# Patient Record
Sex: Female | Born: 1935 | Race: White | Hispanic: No | Marital: Married | Smoking: Never smoker
Health system: Southern US, Community
[De-identification: ages and names within clinical notes are randomized; demographics above are authoritative.]

## PROBLEM LIST (undated history)

## (undated) DIAGNOSIS — I340 Nonrheumatic mitral (valve) insufficiency: Secondary | ICD-10-CM

## (undated) DIAGNOSIS — I499 Cardiac arrhythmia, unspecified: Secondary | ICD-10-CM

## (undated) DIAGNOSIS — I6381 Other cerebral infarction due to occlusion or stenosis of small artery: Secondary | ICD-10-CM

## (undated) DIAGNOSIS — C649 Malignant neoplasm of unspecified kidney, except renal pelvis: Secondary | ICD-10-CM

## (undated) DIAGNOSIS — Z923 Personal history of irradiation: Secondary | ICD-10-CM

## (undated) DIAGNOSIS — Z8673 Personal history of transient ischemic attack (TIA), and cerebral infarction without residual deficits: Secondary | ICD-10-CM

## (undated) DIAGNOSIS — E039 Hypothyroidism, unspecified: Secondary | ICD-10-CM

## (undated) DIAGNOSIS — M810 Age-related osteoporosis without current pathological fracture: Secondary | ICD-10-CM

## (undated) DIAGNOSIS — R0602 Shortness of breath: Secondary | ICD-10-CM

## (undated) DIAGNOSIS — E7800 Pure hypercholesterolemia, unspecified: Secondary | ICD-10-CM

## (undated) DIAGNOSIS — C189 Malignant neoplasm of colon, unspecified: Secondary | ICD-10-CM

## (undated) DIAGNOSIS — Z8679 Personal history of other diseases of the circulatory system: Secondary | ICD-10-CM

## (undated) DIAGNOSIS — C50919 Malignant neoplasm of unspecified site of unspecified female breast: Secondary | ICD-10-CM

## (undated) DIAGNOSIS — Z85038 Personal history of other malignant neoplasm of large intestine: Secondary | ICD-10-CM

## (undated) DIAGNOSIS — N1832 Chronic kidney disease, stage 3b: Secondary | ICD-10-CM

## (undated) DIAGNOSIS — Z9221 Personal history of antineoplastic chemotherapy: Secondary | ICD-10-CM

## (undated) DIAGNOSIS — I4891 Unspecified atrial fibrillation: Secondary | ICD-10-CM

## (undated) DIAGNOSIS — I341 Nonrheumatic mitral (valve) prolapse: Secondary | ICD-10-CM

## (undated) HISTORY — PX: ILEOSTOMY: SHX1783

## (undated) HISTORY — PX: BREAST BIOPSY: SHX20

## (undated) HISTORY — DX: Unspecified atrial fibrillation: I48.91

## (undated) HISTORY — DX: Age-related osteoporosis without current pathological fracture: M81.0

## (undated) HISTORY — DX: Malignant neoplasm of unspecified site of unspecified female breast: C50.919

## (undated) HISTORY — DX: Personal history of other malignant neoplasm of large intestine: Z85.038

## (undated) HISTORY — DX: Pure hypercholesterolemia, unspecified: E78.00

## (undated) HISTORY — DX: Other cerebral infarction due to occlusion or stenosis of small artery: I63.81

## (undated) HISTORY — DX: Nonrheumatic mitral (valve) prolapse: I34.1

## (undated) HISTORY — DX: Hypothyroidism, unspecified: E03.9

## (undated) HISTORY — DX: Malignant neoplasm of unspecified kidney, except renal pelvis: C64.9

## (undated) HISTORY — DX: Malignant neoplasm of colon, unspecified: C18.9

## (undated) HISTORY — DX: Shortness of breath: R06.02

## (undated) HISTORY — PX: BREAST LUMPECTOMY: SHX2

---

## 1988-01-26 DIAGNOSIS — C50919 Malignant neoplasm of unspecified site of unspecified female breast: Secondary | ICD-10-CM

## 1988-01-26 HISTORY — DX: Malignant neoplasm of unspecified site of unspecified female breast: C50.919

## 1998-01-25 HISTORY — PX: COLON SURGERY: SHX602

## 1998-02-07 ENCOUNTER — Ambulatory Visit (HOSPITAL_COMMUNITY): Admission: RE | Admit: 1998-02-07 | Discharge: 1998-02-07 | Payer: Self-pay | Admitting: Gastroenterology

## 1998-02-28 ENCOUNTER — Other Ambulatory Visit: Admission: RE | Admit: 1998-02-28 | Discharge: 1998-02-28 | Payer: Self-pay | Admitting: Urology

## 1998-04-18 ENCOUNTER — Encounter: Payer: Self-pay | Admitting: General Surgery

## 1998-04-22 ENCOUNTER — Inpatient Hospital Stay (HOSPITAL_COMMUNITY): Admission: RE | Admit: 1998-04-22 | Discharge: 1998-04-27 | Payer: Self-pay | Admitting: General Surgery

## 1999-02-20 ENCOUNTER — Other Ambulatory Visit: Admission: RE | Admit: 1999-02-20 | Discharge: 1999-02-20 | Payer: Self-pay | Admitting: Obstetrics and Gynecology

## 1999-03-13 ENCOUNTER — Encounter: Admission: RE | Admit: 1999-03-13 | Discharge: 1999-03-13 | Payer: Self-pay | Admitting: Obstetrics and Gynecology

## 1999-03-13 ENCOUNTER — Encounter: Payer: Self-pay | Admitting: Obstetrics and Gynecology

## 1999-10-14 ENCOUNTER — Encounter: Payer: Self-pay | Admitting: Oncology

## 1999-10-14 ENCOUNTER — Encounter: Admission: RE | Admit: 1999-10-14 | Discharge: 1999-10-14 | Payer: Self-pay | Admitting: Oncology

## 2000-02-23 ENCOUNTER — Other Ambulatory Visit: Admission: RE | Admit: 2000-02-23 | Discharge: 2000-02-23 | Payer: Self-pay | Admitting: Obstetrics and Gynecology

## 2000-10-14 ENCOUNTER — Encounter: Payer: Self-pay | Admitting: Obstetrics and Gynecology

## 2000-10-14 ENCOUNTER — Encounter: Admission: RE | Admit: 2000-10-14 | Discharge: 2000-10-14 | Payer: Self-pay | Admitting: Obstetrics and Gynecology

## 2000-10-31 ENCOUNTER — Encounter: Payer: Self-pay | Admitting: Obstetrics and Gynecology

## 2000-10-31 ENCOUNTER — Encounter: Admission: RE | Admit: 2000-10-31 | Discharge: 2000-10-31 | Payer: Self-pay | Admitting: Obstetrics and Gynecology

## 2001-02-28 ENCOUNTER — Other Ambulatory Visit: Admission: RE | Admit: 2001-02-28 | Discharge: 2001-02-28 | Payer: Self-pay | Admitting: Obstetrics and Gynecology

## 2001-10-23 ENCOUNTER — Encounter: Payer: Self-pay | Admitting: Oncology

## 2001-10-23 ENCOUNTER — Encounter: Admission: RE | Admit: 2001-10-23 | Discharge: 2001-10-23 | Payer: Self-pay | Admitting: Oncology

## 2002-01-05 ENCOUNTER — Encounter: Payer: Self-pay | Admitting: Family Medicine

## 2002-01-05 ENCOUNTER — Encounter: Admission: RE | Admit: 2002-01-05 | Discharge: 2002-01-05 | Payer: Self-pay | Admitting: Family Medicine

## 2002-03-13 ENCOUNTER — Encounter: Payer: Self-pay | Admitting: Internal Medicine

## 2002-04-05 ENCOUNTER — Other Ambulatory Visit: Admission: RE | Admit: 2002-04-05 | Discharge: 2002-04-05 | Payer: Self-pay | Admitting: Obstetrics and Gynecology

## 2002-04-16 ENCOUNTER — Ambulatory Visit: Admission: RE | Admit: 2002-04-16 | Discharge: 2002-04-16 | Payer: Self-pay | Admitting: *Deleted

## 2002-11-09 ENCOUNTER — Encounter: Payer: Self-pay | Admitting: Obstetrics and Gynecology

## 2002-11-09 ENCOUNTER — Encounter: Admission: RE | Admit: 2002-11-09 | Discharge: 2002-11-09 | Payer: Self-pay | Admitting: Obstetrics and Gynecology

## 2003-03-23 ENCOUNTER — Emergency Department (HOSPITAL_COMMUNITY): Admission: AD | Admit: 2003-03-23 | Discharge: 2003-03-24 | Payer: Self-pay | Admitting: Emergency Medicine

## 2003-04-09 ENCOUNTER — Other Ambulatory Visit: Admission: RE | Admit: 2003-04-09 | Discharge: 2003-04-09 | Payer: Self-pay | Admitting: Obstetrics and Gynecology

## 2003-10-17 ENCOUNTER — Emergency Department (HOSPITAL_COMMUNITY): Admission: EM | Admit: 2003-10-17 | Discharge: 2003-10-17 | Payer: Self-pay | Admitting: Emergency Medicine

## 2003-12-27 ENCOUNTER — Encounter: Admission: RE | Admit: 2003-12-27 | Discharge: 2003-12-27 | Payer: Self-pay | Admitting: Obstetrics and Gynecology

## 2004-01-17 ENCOUNTER — Encounter: Admission: RE | Admit: 2004-01-17 | Discharge: 2004-01-17 | Payer: Self-pay | Admitting: General Surgery

## 2004-04-13 ENCOUNTER — Other Ambulatory Visit: Admission: RE | Admit: 2004-04-13 | Discharge: 2004-04-13 | Payer: Self-pay | Admitting: Obstetrics and Gynecology

## 2004-11-03 ENCOUNTER — Encounter: Payer: Self-pay | Admitting: Internal Medicine

## 2005-01-15 ENCOUNTER — Encounter: Admission: RE | Admit: 2005-01-15 | Discharge: 2005-01-15 | Payer: Self-pay | Admitting: Obstetrics and Gynecology

## 2005-04-21 ENCOUNTER — Other Ambulatory Visit: Admission: RE | Admit: 2005-04-21 | Discharge: 2005-04-21 | Payer: Self-pay | Admitting: Obstetrics and Gynecology

## 2006-01-27 ENCOUNTER — Encounter: Admission: RE | Admit: 2006-01-27 | Discharge: 2006-01-27 | Payer: Self-pay | Admitting: Obstetrics and Gynecology

## 2007-03-02 ENCOUNTER — Encounter: Admission: RE | Admit: 2007-03-02 | Discharge: 2007-03-02 | Payer: Self-pay | Admitting: Obstetrics and Gynecology

## 2008-04-01 ENCOUNTER — Encounter: Admission: RE | Admit: 2008-04-01 | Discharge: 2008-04-01 | Payer: Self-pay | Admitting: Obstetrics and Gynecology

## 2008-12-16 ENCOUNTER — Encounter: Payer: Self-pay | Admitting: Internal Medicine

## 2008-12-17 ENCOUNTER — Encounter: Admission: RE | Admit: 2008-12-17 | Discharge: 2008-12-17 | Payer: Self-pay | Admitting: Family Medicine

## 2008-12-23 DIAGNOSIS — E039 Hypothyroidism, unspecified: Secondary | ICD-10-CM | POA: Insufficient documentation

## 2008-12-23 DIAGNOSIS — E782 Mixed hyperlipidemia: Secondary | ICD-10-CM | POA: Insufficient documentation

## 2008-12-23 DIAGNOSIS — E785 Hyperlipidemia, unspecified: Secondary | ICD-10-CM

## 2008-12-24 ENCOUNTER — Ambulatory Visit: Payer: Self-pay | Admitting: Internal Medicine

## 2008-12-24 DIAGNOSIS — R222 Localized swelling, mass and lump, trunk: Secondary | ICD-10-CM | POA: Insufficient documentation

## 2008-12-24 DIAGNOSIS — J9819 Other pulmonary collapse: Secondary | ICD-10-CM | POA: Insufficient documentation

## 2008-12-25 LAB — CONVERTED CEMR LAB
Basophils Relative: 1.2 % (ref 0.0–3.0)
Eosinophils Absolute: 0.1 10*3/uL (ref 0.0–0.7)
HCT: 38.6 % (ref 36.0–46.0)
Hemoglobin: 13 g/dL (ref 12.0–15.0)
INR: 1 (ref 0.8–1.0)
Lymphs Abs: 1.5 10*3/uL (ref 0.7–4.0)
MCHC: 33.7 g/dL (ref 30.0–36.0)
MCV: 95.1 fL (ref 78.0–100.0)
Monocytes Absolute: 0.4 10*3/uL (ref 0.1–1.0)
Monocytes Relative: 7.2 % (ref 3.0–12.0)
Neutro Abs: 3.6 10*3/uL (ref 1.4–7.7)
Platelets: 237 10*3/uL (ref 150.0–400.0)
Prothrombin Time: 10.6 s (ref 9.1–11.7)
RBC: 4.06 M/uL (ref 3.87–5.11)
RDW: 12.6 % (ref 11.5–14.6)
WBC: 5.7 10*3/uL (ref 4.5–10.5)
aPTT: 30.9 s — ABNORMAL HIGH (ref 21.7–28.8)

## 2008-12-27 ENCOUNTER — Ambulatory Visit: Admission: RE | Admit: 2008-12-27 | Discharge: 2008-12-27 | Payer: Self-pay | Admitting: Internal Medicine

## 2008-12-27 ENCOUNTER — Ambulatory Visit: Payer: Self-pay | Admitting: Internal Medicine

## 2009-01-02 ENCOUNTER — Telehealth: Payer: Self-pay | Admitting: Internal Medicine

## 2009-01-27 ENCOUNTER — Telehealth: Payer: Self-pay | Admitting: Internal Medicine

## 2009-02-05 ENCOUNTER — Telehealth: Payer: Self-pay | Admitting: Pulmonary Disease

## 2009-02-21 ENCOUNTER — Ambulatory Visit: Payer: Self-pay | Admitting: Internal Medicine

## 2009-02-21 DIAGNOSIS — R059 Cough, unspecified: Secondary | ICD-10-CM | POA: Insufficient documentation

## 2009-02-21 DIAGNOSIS — R05 Cough: Secondary | ICD-10-CM | POA: Insufficient documentation

## 2009-02-24 ENCOUNTER — Ambulatory Visit: Payer: Self-pay | Admitting: Internal Medicine

## 2009-02-27 ENCOUNTER — Ambulatory Visit: Payer: Self-pay | Admitting: Cardiology

## 2009-03-03 ENCOUNTER — Encounter: Payer: Self-pay | Admitting: Internal Medicine

## 2009-03-03 ENCOUNTER — Ambulatory Visit: Payer: Self-pay | Admitting: Internal Medicine

## 2009-03-05 ENCOUNTER — Encounter: Payer: Self-pay | Admitting: Internal Medicine

## 2009-03-13 ENCOUNTER — Encounter: Payer: Self-pay | Admitting: Internal Medicine

## 2009-03-26 ENCOUNTER — Telehealth: Payer: Self-pay | Admitting: Internal Medicine

## 2009-04-09 ENCOUNTER — Ambulatory Visit: Payer: Self-pay | Admitting: Internal Medicine

## 2009-04-10 ENCOUNTER — Telehealth: Payer: Self-pay | Admitting: Internal Medicine

## 2009-04-18 ENCOUNTER — Telehealth: Payer: Self-pay | Admitting: Internal Medicine

## 2009-06-17 ENCOUNTER — Ambulatory Visit: Payer: Self-pay | Admitting: Internal Medicine

## 2009-08-15 ENCOUNTER — Encounter: Admission: RE | Admit: 2009-08-15 | Discharge: 2009-08-15 | Payer: Self-pay | Admitting: Obstetrics and Gynecology

## 2010-02-26 NOTE — Miscellaneous (Signed)
Summary: spacer rx  Clinical Lists Changes  Medications: Added new medication of E-Z SPACER  DEVI (SPACER/AERO-HOLDING CHAMBERS) as directed with inhaler

## 2010-02-26 NOTE — Assessment & Plan Note (Signed)
Summary: follow-up   Visit Type:  Follow-up Copy to:  Dr. Dennard Schaumann Primary Provider/Referring Provider:  Dr. Margaretmary Eddy, [place]  CC:  Pt here to discuss CT results and PFT results..  History of Present Illness: IOV 12/24/2008. 75 year old female, passive smoker for 1st 23 years of life, with prior Breast cancer 1990 and Colon Cancer 2000 both in complete remission per hx. S/p Colectomy/ilesotomy. Saw Dr. Dennard Schaumann PMD on 12/16/2008 for 'thyroid check'. She asked for CXR because it had been 10 years since prior cancer and neigbors were being diagnosed with lung cancer. This CXR 12/16/2008 showed RML opacity. This was followed by CT chest 12/17/2008 that showed RML collapse/infilrate described as 'junky' associated with COPD. However, she thinks these findings are acute because she had acute smoke exposure while brewing [place] stew 10 days earlier. Few days after that, developed dry cough with occassional yellow scanty sputum.  Needing cough drops for relief. Cough made worse by talking a lot, desserts, tea etc., but no other precipitating factors. Cough is rated as mild and is improved significantly (> 50%) since onset. Took Zpak that completed 2 days ago. Denies associated fever, sinus drainge, cold, body aches, chest pains, wheezing, dyspnea, edema, URI symptoms  Of note, she has old CXR 03/13/2002 and 11/03/2004 - I revieed those films., the RML infilrate was not seen then  REC: Bronch BAL   12/27/2008: Bronch BAL - necrotic cells and debris. No malignant cells. 720 cells, 51% PMN. Micro negative. Plan to repeat CT chest  8 weeksOV 02/21/2009: Followup for cough and RML atlectasis/consolidation. She is due to have CT on 02/27/2009 but cough has not improived afte rthei intial 50-75% improveent she had before the bronch. She feels cough is bothering her family but she herself feels that she can manage with it. She is puzzled about etiology. SHe is concerned about persistence.  Mostly dry but feels thick sputum stuck in throat and unable to bring it out. Able to bring out occ. scanty yellow sputum early in morning or after drinking somehting cool. She has tried mucinex and tessalon perles but this has not helped. Cough worse day compared to night. No new asscociated fever, weight loss, edema, hemoptysis, dyspnea, wheeze. Does have associated left msk chest pain related to cough. REC: GET CT CHEST and DO FULL PFTs  OV 03/03/2009: Followup for cough and RML atlectasis/consolidation. Here to review PFT and CT Chest. CT chest on 02/27/2009 shows complete resolution of the RML consolidation. There is some scar remaining. PFTs 02/24/2009 show small airway diesease on spirometry, Airtrappling/Hyperinflation on Lung Volumes, with normal DLCO (Fev1 1.9/84%, FVC 2.7L84%, Ratio 71 (71), Small airway 1.1L/47%, TLC 6.5L/118%, RV 3.7L/170%, DLCO 17/81%). Her husband is with her today. Both again very concerned and anxious about cough. Again describes cough as 'wet' cough. Again relates onset to the singular event of brewing stew. Used some old wood that had worms. Feels sputum in throat all the time but able to bring it out only occassonally. Cough present day and night. Worsened when lying down, and talking. Improved by eating something sweet or cool. Asscoiated feeling of ticklish throat present. Denies fever, hemoptysis, edema. Anxious to get better.   Current Medications (verified): 1)  Levothroid 100 Mcg Tabs (Levothyroxine Sodium) .... Take 1 Tablet By Mouth Once A Day 2)  Metoprolol Succinate 25 Mg Xr24h-Tab (Metoprolol Succinate) .... Take 1 Tablet By Mouth Two Times A Day 3)  Calcium-Vitamin D 250-125 Mg-Unit Tabs (Calcium Carbonate-Vitamin D) .Marland KitchenMarland KitchenMarland Kitchen  Take 1 Tablet By Mouth Two Times A Day  Allergies (verified): 1)  ! Crestor (Rosuvastatin Calcium)  Past History:  Past Medical History: Last updated: 12-28-08 Breast cancer 1990, pt had chemo and radiation Ulcerative Colitis    Hypothyroidism Hyperlipidemia Colon Cancer 2000. In complete remisson  Labs 12/16/08: Glucose-133, Creatinine-0.97, Calcium-10.0  Past Surgical History: Last updated: 12/28/2008 Ileostomy-2000 Lumpectomy-1990  Family History: Last updated: Dec 28, 2008 mother-died age 21 COPD, bronchiectasis Father-died 80's Lung Cancer, was a smoker Sister-breast cancer, MAC  Social History: Last updated: 12-28-2008 Married Retired Patient never smoked.  Denies ETOH. Faither smoked. Passively smoked first 23 years of life because dad smoked heavily inside home  Risk Factors: Smoking Status: never (12/28/2008)  Family History: Reviewed history from December 28, 2008 and no changes required. mother-died age 57 COPD, bronchiectasis Father-died 97's Lung Cancer, was a smoker Sister-breast cancer, MAC  Social History: Reviewed history from 12/28/08 and no changes required. Married Retired Patient never smoked.  Denies ETOH. Faither smoked. Passively smoked first 23 years of life because dad smoked heavily inside home  Review of Systems      See HPI       The patient complains of prolonged cough.  The patient denies anorexia, fever, weight loss, weight gain, vision loss, decreased hearing, hoarseness, chest pain, syncope, dyspnea on exertion, peripheral edema, headaches, hemoptysis, abdominal pain, melena, hematochezia, severe indigestion/heartburn, hematuria, incontinence, genital sores, muscle weakness, suspicious skin lesions, transient blindness, difficulty walking, depression, unusual weight change, abnormal bleeding, enlarged lymph nodes, angioedema, breast masses, and testicular masses.    Vital Signs:  Patient profile:   75 year old female Height:      67 inches Weight:      168 pounds O2 Sat:      96 % on Room air Temp:     97.6 degrees F oral Pulse rate:   60 / minute BP sitting:   96 / 68  (right arm) Cuff size:   regular  Vitals Entered By: [place] Bing CMA (March 03, 2009 9:16 AM)  O2 Flow:  Room air CC: Pt here to discuss CT results and PFT results. Comments Medications reviewed with patient [place] Bing CMA  March 03, 2009 9:18 AM Daytime phone number verified with patient.    Physical Exam  General:  well developed, well nourished, in no acute distress. Occ. cough in office Head:  normocephalic and atraumatic Eyes:  PERRLA/EOM intact; conjunctiva and sclera clear Ears:  TMs intact and clear with normal canals Nose:  no deformity, discharge, inflammation, or lesions Mouth:  no deformity or lesions Neck:  no masses, thyromegaly, or abnormal cervical nodes Chest Wall:  no deformities noted Lungs:  clear bilaterally to auscultation and percussion Heart:  regular rate and rhythm, S1, S2 without murmurs, rubs, gallops, or clicks Abdomen:  bowel sounds positive; abdomen soft and non-tender without masses, or organomegaly Msk:  no deformity or scoliosis noted with normal posture Pulses:  pulses normal Extremities:  no clubbing, cyanosis, edema, or deformity noted Neurologic:  CN II-XII grossly intact with normal reflexes, coordination, muscle strength and tone Skin:  intact without lesions or rashes Cervical Nodes:  no significant adenopathy Axillary Nodes:  no significant adenopathy Psych:  alert and cooperative; normal mood and affect; normal attention span and concentration   MISC. Report  Procedure date:  02/27/2009  Findings:       CT chest on 02/27/2009 shows complete resolution of the RML consolidation. There is some scar remaining. PFTs 02/24/2009 show small  airway diesease on spirometry, Airtrappling/Hyperinflation on Lung Volumes, with normal DLCO (Fev1 1.9/84%, FVC 2.7L84%, Ratio 71 (71), Small airway 1.1L/47%, TLC 6.5L/118%, RV 3.7L/170%, DLCO 17/81%)  Comments:      personally reviewed both  Impression & Recommendations:  Problem # 1:  COUGH (ICD-786.2) Assessment Unchanged PFTs are suggesting air trapping and  hyperinflation. The RML infiltrate has resolved. Yet, she is still left iwht cough. Onset was following wood smoke exposure. Possibiliies include a chronic bronchitis like phentotpye, post smoke exposure hypersenstivie airways (even though BD response is negative), associated silent sinus drainage. She denies GERD. I am reassure that the infiltraes have resolved  plan nasal steroids for possible sinus drainage inahled steroid for airtrapping and high residual volume on pft rov 4 weeks if not better, address GERD/other etiologies including habit cough advised patience and perserverance during process of cough managment Orders: HFA Instruction [id number]) Est. Patient Level III [id number])  Problem # 2:  PULMONARY COLLAPSE (ICD-518.0) Assessment: Improved resolved on followup CT Orders: Est. Patient Level III [id number])  Medications Added to Medication List This Visit: 1)  Dulera 100-5 Mcg/act Aero (Mometasone furo-formoterol fum) .... 2 puff two times a day 2)  Nasacort Aq 55 Mcg/act Aers (Triamcinolone acetonide(nasal)) .... Two puffs each nostril daily  Patient Instructions: 1)  Your cough is likely related to a chronic bronchitis like picture or post wood smoke exposure twitchy airways and/or silent sinus drainage 2)  Please take 2 samples each of nasal steroid and low dose dulera 3)  Learn how to use these inhalers 4)  Return to see me in 4 weeks or sooner if worse Prescriptions: NASACORT AQ 55 MCG/ACT  AERS (TRIAMCINOLONE ACETONIDE(NASAL)) Two puffs each nostril daily  #1 x 6   Entered and Authorized by:   Brand Males MD   Signed by:   Brand Males MD on 03/03/2009   Method used:   Electronically to        [facility] [phone]* (retail)       [address]       [place], [place]  [address]       Ph: [phone]       Fax: [phone]   RxIDTI:[id number] DULERA 100-5 MCG/ACT AERO (MOMETASONE FURO-FORMOTEROL FUM) 2 puff two times a day  #1 x 6   Entered and  Authorized by:   Brand Males MD   Signed by:   Brand Males MD on 03/03/2009   Method used:   Electronically to        [facility] [phone]* (retail)       [address]       [place],   [address]       Ph: [phone]       Fax: [phone]   RxIDTT:[id number]

## 2010-02-26 NOTE — Progress Notes (Signed)
Summary: talk to nurse for ct and meds  Phone Note Call from Patient Call back at Home Phone [phone]   Caller: Patient Call For: Agam Tuohy Summary of Call: would like the nurse to call her. she thought that she needed  to schedule a ct scan, she was told to call back at the first of the year to schedule that. Also she has not been feeling any better, she still has bronchitis and coughing up green stuff. She wants dr Geoffery Spruce to call something in for her.  Initial call taken by: Adin Hector,  January 27, 2009 12:32 PM  Follow-up for Phone Call        Pt c/o productive cough with green thick mucus. Pt scheduled to see TP Wed @ [place] CMA  January 27, 2009 2:37 PM

## 2010-02-26 NOTE — Progress Notes (Signed)
Summary: rx not at pharmacy yet  Phone Note Call from Patient Call back at Home Phone [phone]   Caller: Patient Call For: Tessa Seaberry Summary of Call: pt needs the rx for amoxicillin called in to [facility] [address]. pt was seen yesterday but wasn't given a rx for this.  Initial call taken by: Cooper Render, CNA,  April 10, 2009 8:54 AM  Follow-up for Phone Call        rx sent. pt aware.[place] Bing CMA  April 10, 2009 9:05 AM     New/Updated Medications: AMOXICILLIN 500 MG TABS (AMOXICILLIN) Take 1 tablet by mouth three times a day x 5 days Prescriptions: AMOXICILLIN 500 MG TABS (AMOXICILLIN) Take 1 tablet by mouth three times a day x 5 days  #15 x 0   Entered by:   [place] Bing CMA   Authorized by:   Brand Males MD   Signed by:   [place] Bing CMA on 04/10/2009   Method used:   Electronically to        [facility] [phone]* (retail)       [address]       [place], [place]  [address]       Ph: [phone]       Fax: [phone]   RxID:   [id number]

## 2010-02-26 NOTE — Assessment & Plan Note (Signed)
Summary: rov 7-8 wks ///kp   Visit Type:  Follow-up Copy to:  Dr. Dennard Schaumann Primary Provider/Referring Provider:  Dr. Margaretmary Eddy, [place]  CC:  Jocelyn Moss here for follow-up. Jocelyn Moss states cough has improved but is still present. .  History of Present Illness: OV 06/17/2009: Followup for chronic cough. Last visit was on 03/31/2009. Even though by Feb 2011 the  RML infilrte that developed  following her acute smolke exposure in NOv 2010 had resolved, she was still left with persistent cough. Therefore 2 visits ago in Feb 2011 we decided to trial [facility] and nasonex. I followed up again on March 31, 2009. She reported no improvement. My plan was to trial PPIs. Instead she adamantly wanted to trial a course of antibiotic AMOX. She called a few week later stating that cough had further imprved "99.9%". However, at followup today she is contradicting her own history. She states that the amox "was not worth a piece of crap". It did not help. Most recently on 06/08/2009 she completed a 10 day course of avelox for acute sinusitis. THis has not helped cough either. Essentially the cough persists at its residual moderate level. She is compliant with dulera but does not think it is helping. PMD has given her dexilant PPI. She has been on it 15 days and does not think itis helping either. Nasonex was not helping and she dc'ed it 1 month ago. Only relieving factor is honey.   She still feels ticklishness in throat. Feels that the throat is the source of cough. Brings up scanty yellpw sputum a few times daily. Vehmently denies post nasal drip or GERD.Overall she states that her husband is more bothered by her cough than her.   Current Medications (verified): 1)  Levothroid 100 Mcg Tabs (Levothyroxine Sodium) .... Take 1 Tablet By Mouth Once A Day 2)  Metoprolol Succinate 25 Mg Xr24h-Tab (Metoprolol Succinate) .... Take 1 Tablet By Mouth Two Times A Day 3)  Calcium-Vitamin D 250-125 Mg-Unit Tabs (Calcium  Carbonate-Vitamin D) .... Take 1 Tablet By Mouth Two Times A Day 4)  Fluticasone Propionate 50 Mcg/act Susp (Fluticasone Propionate) .... 2 Sprays Each Nostril Two Times A Day 5)  E-Z Spacer  Devi (Spacer/aero-Holding Chambers) .... As Directed With Inhaler 6)  Dulera 100-5 Mcg/act Aero (Mometasone Furo-Formoterol Fum) .... 2 Puffs Twice Daily 7)  Dexilant 60 Mg Cpdr (Dexlansoprazole) .... Take 1 Tablet By Mouth Once A Day  Allergies (verified): 1)  ! Crestor (Rosuvastatin Calcium)  Past History:  Family History: Last updated: Jan 06, 2009 mother-died age 85 COPD, bronchiectasis Father-died 12's Lung Cancer, was a smoker Sister-breast cancer, MAC  Social History: Last updated: 01/06/09 Married Retired Patient never smoked.  Denies ETOH. Faither smoked. Passively smoked first 23 years of life because dad smoked heavily inside home  Risk Factors: Smoking Status: never (Jan 06, 2009)  Past Medical History: Reviewed history from 01-06-2009 and no changes required. Breast cancer 1990, Jocelyn Moss had chemo and radiation Ulcerative Colitis  Hypothyroidism Hyperlipidemia Colon Cancer 2000. In complete remisson  Labs 12/16/08: Glucose-133, Creatinine-0.97, Calcium-10.0  Past Surgical History: Reviewed history from 01/06/09 and no changes required. Ileostomy-2000 Lumpectomy-1990  Past Pulmonary History:  Pulmonary History: CHRONIC COUGH   > passive smoker 1st 23 years of life > Cough followed acute smoke exposure while brewing [place] Stew 12/07/2008 > CT 12/17/2008 - RML collapse/infiltrate > Bronch BAL 12/27/2008: necrotic cell debris, 51%PMC. No malignance cless.Micro neg > PFTs 02/24/2009: - small airway disease with normal DLCO. Fev1 1.9/84%, Ratio  71(71),    DLCO 17/81%, TLC 6.5L/118% > Followup CT 02/27/2009: complete resolution of RML consolidaiton. Residual scar + > OV 03/03/2009: Residual cough +. Start empiric dulera and nasonex > Ov 04/02/2009:No improvement with dulera  and nasonex. EMpiric Amox Trial at her request  Family History: Reviewed history from 12/24/2008 and no changes required. mother-died age 48 COPD, bronchiectasis Father-died 65's Lung Cancer, was a smoker Sister-breast cancer, MAC  Social History: Reviewed history from 12/24/2008 and no changes required. Married Retired Patient never smoked.  Denies ETOH. Faither smoked. Passively smoked first 23 years of life because dad smoked heavily inside home  Review of Systems       The patient complains of productive cough and non-productive cough.  The patient denies shortness of breath with activity, shortness of breath at rest, coughing up blood, chest pain, irregular heartbeats, acid heartburn, indigestion, loss of appetite, weight change, abdominal pain, difficulty swallowing, sore throat, tooth/dental problems, headaches, nasal congestion/difficulty breathing through nose, sneezing, itching, ear ache, anxiety, depression, hand/feet swelling, joint stiffness or pain, rash, change in color of mucus, and fever.    Vital Signs:  Patient profile:   75 year old female Height:      67 inches Weight:      168 pounds O2 Sat:      98 % on Room air Temp:     98.7 degrees F oral Pulse rate:   67 / minute BP sitting:   124 / 84  (right arm) Cuff size:   regular  Vitals Entered By: [place] Bing CMA (Jun 17, 2009 2:54 PM)  O2 Flow:  Room air CC: Jocelyn Moss here for follow-up. Jocelyn Moss states cough has improved but is still present.  Comments Medications reviewed with patient [place] Bing CMA  Jun 17, 2009 2:54 PM Daytime phone number verified with patient.    Physical Exam  General:  well developed, well nourished, in no acute distress. Coughed once - mild laryngeal quality + Head:  normocephalic and atraumatic Eyes:  PERRLA/EOM intact; conjunctiva and sclera clear Ears:  TMs intact and clear with normal canals Nose:  no deformity, discharge, inflammation, or lesions Mouth:  no deformity or  lesions Neck:  no masses, thyromegaly, or abnormal cervical nodes Chest Wall:  no deformities noted Lungs:  clear bilaterally to auscultation and percussion Heart:  regular rate and rhythm, S1, S2 without murmurs, rubs, gallops, or clicks Abdomen:  bowel sounds positive; abdomen soft and non-tender without masses, or organomegaly Msk:  no deformity or scoliosis noted with normal posture Pulses:  pulses normal Extremities:  no clubbing, cyanosis, edema, or deformity noted Neurologic:  CN II-XII grossly intact with normal reflexes, coordination, muscle strength and tone Skin:  intact without lesions or rashes Cervical Nodes:  no significant adenopathy Axillary Nodes:  no significant adenopathy Psych:  alert and cooperative; normal mood and affect; normal attention span and concentration   Impression & Recommendations:  Problem # 1:  COUGH (ICD-786.2) Assessment Unchanged  no improvement in cough despite resolution of RML infiltrate and despite empiric dulera,nasonex, PPI Rx. COugh has laryngeal quality to it except for the fact she brings out trace amountts of yellow sputum daily. Denies post nasal drainge  plan recommendd to continue dulera and PPI for time being start netti pot in case there is silent sinus drainge for which nasonex was not helping start neurontin for habit cough - I explained the concept of habitcough - she is reluctant to buy this hypothesis fu in 1 month if no  improvement, will get 2nd opinion from Dr. Melvyn Novas  Orders: Est. Patient Level III [phone])  Medications Added to Medication List This Visit: 1)  Dexilant 60 Mg Cpdr (Dexlansoprazole) .... Take 1 tablet by mouth once a day 2)  Neurontin 300 Mg Caps (Gabapentin) .... 3 tablets three times daily. day 1 - one tablet onely, day 2 - two tablets two times a day, and day 3 on - three times a day  Patient Instructions: 1)  take netti pot sample with you and use it daily 2)  start neurontin for cough AS  DIRECTED 3)  if you are too sleep with neurontin, call us 4)  take the side effect sheet for neurontin 5)  continue dulera and dexliant 6)  take dexilant samples for 1 month 7)  return in 1 month for followup 8)  hope you get better 9)  if not better, will refer you to Dr. Melvyn Novas Prescriptions: NEURONTIN 300 MG CAPS (GABAPENTIN) 3 TABLETS THREE TIMES DAILY. DAY 1 - ONE TABLET ONELY, dAY 2 - TWO TABLETS two times a day, AND DAY 3 ON - three times a day  #90 x 1   Entered and Authorized by:   Brand Males MD   Signed by:   Brand Males MD on 06/17/2009   Method used:   Electronically to        [facility] [phone]* (retail)       [address]       [place], [place]  [address]       Ph: [phone]       Fax: [phone]   RxID:   [id number]

## 2010-02-26 NOTE — Progress Notes (Signed)
Summary: dulera sample  Phone Note Call from Patient Call back at Home Phone [phone]   Caller: Patient Call For: Katelynd Blauvelt Reason for Call: Talk to Nurse Summary of Call: pt running low on sample [place].  Can she get another sample? Initial call taken by: Zigmund Gottron,  March 26, 2009 9:14 AM  Follow-up for Phone Call        sample left at front. pt aware.[place] Bing CMA  March 26, 2009 10:10 AM

## 2010-02-26 NOTE — Assessment & Plan Note (Signed)
Summary: 4 weeks/apc   Visit Type:  Follow-up Copy to:  Dr. Dennard Schaumann Primary Provider/Referring Provider:  Dr. Margaretmary Eddy, [facility]  CC:  Pt here for 4 week follow-up.  Nasal drainage and cough has improved and but still has some drainage and cough.  .  History of Present Illness: IOV 12/24/2008. 75 year old female, passive smoker for 1st 23 years of life, with prior Breast cancer 1990 and Colon Cancer 2000 both in complete remission per hx. S/p Colectomy/ilesotomy. Saw Dr. Dennard Schaumann PMD on 12/16/2008 for 'thyroid check'. She asked for CXR because it had been 10 years since prior cancer and neigbors were being diagnosed with lung cancer. This CXR 12/16/2008 showed RML opacity. This was followed by CT chest 12/17/2008 that showed RML collapse/infilrate described as 'junky' associated with COPD. However, she thinks these findings are acute because she had acute smoke exposure while brewing [place] stew 10 days earlier. Few days after that, developed dry cough with occassional yellow scanty sputum.  Needing cough drops for relief. Cough made worse by talking a lot, desserts, tea etc., but no other precipitating factors. Cough is rated as mild and is improved significantly (> 50%) since onset. Took Zpak that completed 2 days ago. Denies associated fever, sinus drainge, cold, body aches, chest pains, wheezing, dyspnea, edema, URI symptoms  Of note, she has old CXR 03/13/2002 and 11/03/2004 - I revieed those films., the RML infilrate was not seen then  REC: Bronch BAL   12/27/2008: Bronch BAL - necrotic cells and debris. No malignant cells. 720 cells, 51% PMN. Micro negative. Plan to repeat CT chest  8 weeksOV 02/21/2009: Followup for cough and RML atlectasis/consolidation. She is due to have CT on 02/27/2009 but cough has not improived afte rthei intial 50-75% improveent she had before the bronch. She feels cough is bothering her family but she herself feels that she can manage with it.  She is puzzled about etiology. SHe is concerned about persistence. Mostly dry but feels thick sputum stuck in throat and unable to bring it out. Able to bring out occ. scanty yellow sputum early in morning or after drinking somehting cool. She has tried mucinex and tessalon perles but this has not helped. Cough worse day compared to night. No new asscociated fever, weight loss, edema, hemoptysis, dyspnea, wheeze. Does have associated left msk chest pain related to cough. REC: GET CT CHEST and DO FULL PFTs  OV 03/03/2009: Followup for cough and RML atlectasis/consolidation. Here to review PFT and CT Chest. CT chest on 02/27/2009 shows complete resolution of the RML consolidation. There is some scar remaining. PFTs 02/24/2009 show small airway diesease on spirometry, Airtrappling/Hyperinflation on Lung Volumes, with normal DLCO (Fev1 1.9/84%, FVC 2.7L84%, Ratio 71 (71), Small airway 1.1L/47%, TLC 6.5L/118%, RV 3.7L/170%, DLCO 17/81%). Her husband is with her today. Both again very concerned and anxious about cough. Again describes cough as 'wet' cough. Again relates onset to the singular event of brewing stew. Used some old wood that had worms. Feels sputum in throat all the time but able to bring it out only occassonally. Cough present day and night. Worsened when lying down, and talking. Improved by eating something sweet or cool. Asscoiated feeling of ticklish throat present. Denies fever, hemoptysis, edema. Anxious to get better.   REC: Empiric Dulera and nasonexOV 04/02/2009: Followup Chronic Cough. Since starting duleran and nasal steroid cough is 'somewhat' better. I pusehd her to quantify and best she could state was that it is better  in the mild-moderate range. Less sputum. Less throat irritation. Still has episodies esp soon after lying down. Has scanty yellow mucus that persists. Denies post nasal drainage, fever, GERD, edema. ticklish sensation in throat and having to clear her throat frequently. She is  asking for AMOX antibiotic to get rid of cough. I reminded her of the differeing cough etiologies and step wise approach to tacking chronic cough. She denies this could be related to sinus drainage or GERD. She defintiely wants to give amox a try. Unwilling to adopt other approaches. Unwilling for referral to ENT to have her sinuses examined.   Allergies: 1)  ! Crestor (Rosuvastatin Calcium)  Past History:  Family History: Last updated: 2008-12-27 mother-died age 81 COPD, bronchiectasis Father-died 48's Lung Cancer, was a smoker Sister-breast cancer, MAC  Social History: Last updated: December 27, 2008 Married Retired Patient never smoked.  Denies ETOH. Faither smoked. Passively smoked first 23 years of life because dad smoked heavily inside home  Risk Factors: Smoking Status: never (Dec 27, 2008)  Past Medical History: Reviewed history from 12-27-2008 and no changes required. Breast cancer 1990, pt had chemo and radiation Ulcerative Colitis  Hypothyroidism Hyperlipidemia Colon Cancer 2000. In complete remisson  Labs 12/16/08: Glucose-133, Creatinine-0.97, Calcium-10.0  Past Surgical History: Reviewed history from 12/27/08 and no changes required. Ileostomy-2000 Lumpectomy-1990  Family History: Reviewed history from 27-Dec-2008 and no changes required. mother-died age 42 COPD, bronchiectasis Father-died 59's Lung Cancer, was a smoker Sister-breast cancer, MAC  Social History: Reviewed history from 27-Dec-2008 and no changes required. Married Retired Patient never smoked.  Denies ETOH. Faither smoked. Passively smoked first 23 years of life because dad smoked heavily inside home  Review of Systems       The patient complains of non-productive cough.  The patient denies shortness of breath with activity, shortness of breath at rest, productive cough, coughing up blood, chest pain, irregular heartbeats, acid heartburn, indigestion, loss of appetite, weight change, abdominal  pain, difficulty swallowing, sore throat, tooth/dental problems, headaches, nasal congestion/difficulty breathing through nose, sneezing, itching, ear ache, anxiety, depression, hand/feet swelling, joint stiffness or pain, rash, change in color of mucus, and fever.    Vital Signs:  Patient profile:   75 year old female Height:      67 inches Weight:      168.25 pounds O2 Sat:      96 % on Room air Temp:     97.4 degrees F oral Pulse rate:   65 / minute BP sitting:   142 / 70  (right arm) Cuff size:   regular  Vitals Entered By: [place] Bing CMA (April 09, 2009 3:24 PM)  O2 Flow:  Room air CC: Pt here for 4 week follow-up.  Nasal drainage and cough has improved, but still has some drainage and cough.   Comments Medications reviewed with patient Berry Bing CMA  April 09, 2009 3:26 PM Daytime phone number verified with patient.    Physical Exam  General:  well developed, well nourished, in no acute distress. No cough in office this time Head:  normocephalic and atraumatic Eyes:  PERRLA/EOM intact; conjunctiva and sclera clear Ears:  TMs intact and clear with normal canals Nose:  no deformity, discharge, inflammation, or lesions Mouth:  no deformity or lesions Neck:  no masses, thyromegaly, or abnormal cervical nodes Chest Wall:  no deformities noted Lungs:  clear bilaterally to auscultation and percussion Heart:  regular rate and rhythm, S1, S2 without murmurs, rubs, gallops, or clicks Abdomen:  bowel sounds positive; abdomen  soft and non-tender without masses, or organomegaly Msk:  no deformity or scoliosis noted with normal posture Pulses:  pulses normal Extremities:  no clubbing, cyanosis, edema, or deformity noted Neurologic:  CN II-XII grossly intact with normal reflexes, coordination, muscle strength and tone Skin:  intact without lesions or rashes Cervical Nodes:  no significant adenopathy Axillary Nodes:  no significant adenopathy Psych:  alert and  cooperative; normal mood and affect; normal attention span and concentration   Impression & Recommendations:  Problem # 1:  COUGH [id number]) Assessment Improved  Feb 2011: PFTs are suggesting air trapping and hyperinflation. The RML infiltrate has resolved. Yet, she is still left iwht cough. Onset was following wood smoke exposure. Possibiliies include a chronic bronchitis like phentotpye, post smoke exposure hypersenstivie airways (even though BD response is negative), associated silent sinus drainage. She denies GERD. I have reassured that the infiltraes have resolved  March 2011: Will treat with amox per her request. She will call in 1-2 weeks to report cough progress. IF unimprovd, will start GERD Rx. She is agreeable with this plan  plan nasal steroids for possible sinus drainage inahled steroid for airtrapping and high residual volume on pft Start AMOX She  will call in 1-2 weeks to report progress GERD/Habit cough Rx if unresponsive to amox.  advised patience and perserverance during process of cough managment Orders: HFA Instruction [phone]) Est. Patient Level III [id number])  Orders: Est. Patient Level II [id number])  Medications Added to Medication List This Visit: 1)  Dulera 100-5 Mcg/act Aero (Mometasone furo-formoterol fum) .... 2 puffs twice daily  Patient Instructions: 1)  take amoxicillin 500mg  by mouth every 8h x 5 days 2)  call us in 1-2 weeks to report progress 3)  continue nasal steroid and dulera - take 2 samples 4)  if cough not improved in 2 weeks, call us and we will start acid reflux treatment over phone 5)  return to see me in 7-8 weeks

## 2010-02-26 NOTE — Progress Notes (Signed)
Summary: FYI for mr  Phone Note Call from Patient Call back at [facility] Phone [phone]   Caller: Patient Call For: TRUE [place] Summary of Call: pt calling (as per last ov instructions) to update re: her present condition. she states that she is 90-99% better re: cough. "ocassionally coghs". has finished abx. will continue inhaler use and follow other instructions until she returns to see mr. nothing further needed at this time.  Initial call taken by: Cooper Render, CNA,  April 18, 2009 11:12 AM  Follow-up for Phone Call        will forward FYI to MR.  Jinny Blossom Reynolds LPN  March 25, 624THL 075-GRM AM   Additional Follow-up for Phone Call Additional follow up Details #1::        awesome. glad she is tons better Additional Follow-up by: Brand Males MD,  April 18, 2009 11:37 AM

## 2010-02-26 NOTE — Miscellaneous (Signed)
Summary: Orders Update pft charges  Clinical Lists Changes  Orders: Added new Service order of Carbon Monoxide diffusing w/capacity ([id number]) - Signed Added new Service order of Lung Volumes ([id number]) - Signed Added new Service order of Spirometry (Pre & Post) (94060) - Signed 

## 2010-02-26 NOTE — Progress Notes (Signed)
Summary: rx request  Phone Note Call from Patient Call back at Home Phone [phone]   Caller: Patient Call For: NURSE Summary of Call: pt c/o "strangling cough". says that she has been dealing with this since "before thanksgiving". pt had a bronch. she does not want to see anyone other than mr. ( i have scheduled her to be seen w/ mr on 1/28. pt wants to know if nurse could call rx in for her in the meantime to break this up. says yellow mucus is coughed up "sometimes" but mostly is stuck in throat. denies fever. [facility] [address].  Initial call taken by: Cooper Render,  February 05, 2009 2:43 PM  Follow-up for Phone Call        please advise. [place] Bing CMA  February 05, 2009 3:55 PM   Additional Follow-up for Phone Call Additional follow up Details #1::        if she doesn't want to come in for ov, can take mucinex dm extra strength one in am and pm with large glass of water.  if cough itself is an issue, can call in tessalon pearls 100mg  2 every 6hrs if needed for cough.  #30, no fills. Additional Follow-up by: Kathee Delton MD,  February 05, 2009 5:57 PM     Appended Document: rx request see above message and let pt know  Appended Document: rx request spoke with pt and she is already on the mucinex max twice a day, she would like to try the tessalon so i will call this to [facility] [address]   Clinical Lists Changes  Medications: Added new medication of TESSALON PERLES 100 MG CAPS (BENZONATATE) 2 caps every 6 hrs as needed for cough - Signed Rx of TESSALON PERLES 100 MG CAPS (BENZONATATE) 2 caps every 6 hrs as needed for cough;  #30 x 0;  Signed;  Entered by: [place];  Authorized by: Kathee Delton MD;  Method used: Electronically to [facility] [phone]*, [address], [place], [place]  [address], Ph: [phone], Fax: [phone]    Prescriptions: TESSALON PERLES 100 MG CAPS (BENZONATATE) 2 caps every 6 hrs as needed for cough  #30 x 0   Entered  by:   [place]   Authorized by:   Kathee Delton MD   Signed by:   Maryann Conners CMA on 02/06/2009   Method used:   Electronically to        [facility] [phone]* (retail)       [address]       [place], [place]  [address]       Ph: [phone]       Fax: [phone]   RxID:   [id number]

## 2010-02-26 NOTE — Assessment & Plan Note (Signed)
Summary: discuss ct results/apc   Allergies: 1)  ! Crestor (Rosuvastatin Calcium)

## 2010-02-26 NOTE — Assessment & Plan Note (Signed)
Summary: ROV///KP   Visit Type:  Follow-up Copy to:  Dr. Dennard Schaumann Primary Provider/Referring Provider:  Dr. Margaretmary Eddy, [place]  CC:  Pt c/o feeling like something is getting stuck in her throat. Pt also c/o productive cough with gray phlegm.Pt also c/o hearing a rattle in her chest.   Pt c/o soreness on left side of chest under left breast when she coughs.  Pt is asking about using an inhaler. .  History of Present Illness: IOV January 23, 2009. 75 year old female, passive smoker for 1st 23 years of life, with prior Breast cancer 1990 and Colon Cancer 2000 both in complete remission per hx. S/p Colectomy/ilesotomy. Saw Dr. Dennard Schaumann PMD on 12/16/2008 for 'thyroid check'. She asked for CXR because it had been 10 years since prior cancer and neigbors were being diagnosed with lung cancer. This CXR 12/16/2008 showed RML opacity. This was followed by CT chest 12/17/2008 that showed RML collapse/infilrate described as 'junky' associated with COPD. However, she thinks these findings are acute because she had acute smoke exposure while brewing [place] stew 10 days earlier. Few days after that, developed dry cough with occassional yellow scanty sputum.  Needing cough drops for relief. Cough made worse by talking a lot, desserts, tea etc., but no other precipitating factors. Cough is rated as mild and is improved significantly (> 50%) since onset. Took Zpak that completed 2 days ago. Denies associated fever, sinus drainge, cold, body aches, chest pains, wheezing, dyspnea, edema, URI symptoms  Of note, she has old CXR 03/13/2002 and 11/03/2004 - I revieed those films., the RML infilrate was not seen then  REC: Bronch BAL   12/27/2008: Bronch BAL - necrotic cells and debris. No malignant cells. 720 cells, 51% PMN. Micro negative. Plan to repeat CT chest  8 weeksOV 02/21/2009: Followup for cough and RML atlectasis/consolidation. She is due to have CT on 02/27/2009 but cough has not improived afte  rthei intial 50-75% improveent she had before the bronch. She feels cough is bothering her family but she herself feels that she can manage with it. She is puzzled about etiology. SHe is concerned about persistence. Mostly dry but feels thick sputum stuck in throat and unable to bring it out. Able to bring out occ. scanty yellow sputum early in morning or after drinking somehting cool. She has tried mucinex and tessalon perles but this has not helped. Cough worse day compared to night. No new asscociated fever, weight loss, edema, hemoptysis, dyspnea, wheeze. Does have associated left msk chest pain related to cough  Current Medications (verified): 1)  Levothroid 100 Mcg Tabs (Levothyroxine Sodium) .... Take 1 Tablet By Mouth Once A Day 2)  Metoprolol Succinate 25 Mg Xr24h-Tab (Metoprolol Succinate) .... Take 1 Tablet By Mouth Two Times A Day 3)  Calcium-Vitamin D 250-125 Mg-Unit Tabs (Calcium Carbonate-Vitamin D) .... Take 1 Tablet By Mouth Two Times A Day  Allergies (verified): 1)  ! Crestor (Rosuvastatin Calcium)  Past History:  Past Medical History: Last updated: January 23, 2009 Breast cancer 1990, pt had chemo and radiation Ulcerative Colitis  Hypothyroidism Hyperlipidemia Colon Cancer 2000. In complete remisson  Labs 12/16/08: Glucose-133, Creatinine-0.97, Calcium-10.0  Past Surgical History: Last updated: 01/23/09 Ileostomy-2000 Lumpectomy-1990  Family History: Last updated: 01/23/2009 mother-died age 47 COPD, bronchiectasis Father-died 80's Lung Cancer, was a smoker Sister-breast cancer, MAC  Social History: Last updated: 01/23/2009 Married Retired Patient never smoked.  Denies ETOH. Faither smoked. Passively smoked first 23 years of life because dad smoked heavily inside home  Risk Factors: Smoking Status: never (12/24/2008)  Family History: Reviewed history from 12/24/2008 and no changes required. mother-died age 48 COPD, bronchiectasis Father-died 81's Lung  Cancer, was a smoker Sister-breast cancer, MAC  Social History: Reviewed history from 12/24/2008 and no changes required. Married Retired Patient never smoked.  Denies ETOH. Faither smoked. Passively smoked first 23 years of life because dad smoked heavily inside home  Review of Systems       The patient complains of productive cough, chest pain, and change in color of mucus.  The patient denies shortness of breath with activity, shortness of breath at rest, non-productive cough, coughing up blood, irregular heartbeats, acid heartburn, indigestion, loss of appetite, weight change, abdominal pain, difficulty swallowing, sore throat, tooth/dental problems, headaches, nasal congestion/difficulty breathing through nose, sneezing, itching, ear ache, anxiety, depression, hand/feet swelling, joint stiffness or pain, rash, and fever.    Vital Signs:  Patient profile:   75 year old female Height:      67 inches Weight:      167 pounds O2 Sat:      96 % on Room air Temp:     97.4 degrees F Pulse rate:   56 / minute BP sitting:   116 / 72  (right arm) Cuff size:   regular  Vitals Entered By: [place] Bing CMA (February 21, 2009 10:33 AM)  O2 Flow:  Room air CC: Pt c/o feeling like something is getting stuck in her throat. Pt also c/o productive cough with gray phlegm.Pt also c/o hearing a rattle in her chest.   Pt c/o soreness on left side of chest under left breast when she coughs.  Pt is asking about using an inhaler.  Comments Medications reviewed with patient .sphone [place] Bing CMA  February 21, 2009 10:36 AM    Physical Exam  General:  well developed, well nourished, in no acute distress. Occ. cough in office Head:  normocephalic and atraumatic Eyes:  PERRLA/EOM intact; conjunctiva and sclera clear Ears:  TMs intact and clear with normal canals Nose:  no deformity, discharge, inflammation, or lesions Mouth:  no deformity or lesions Neck:  no masses, thyromegaly, or  abnormal cervical nodes Chest Wall:  no deformities noted Lungs:  clear bilaterally to auscultation and percussion Heart:  regular rate and rhythm, S1, S2 without murmurs, rubs, gallops, or clicks Abdomen:  bowel sounds positive; abdomen soft and non-tender without masses, or organomegaly Msk:  no deformity or scoliosis noted with normal posture Pulses:  pulses normal Extremities:  no clubbing, cyanosis, edema, or deformity noted Neurologic:  CN II-XII grossly intact with normal reflexes, coordination, muscle strength and tone Skin:  intact without lesions or rashes Cervical Nodes:  no significant adenopathy Axillary Nodes:  no significant adenopathy Psych:  alert and cooperative; normal mood and affect; normal attention span and concentration   Impression & Recommendations:  Problem # 1:  COUGH (ICD-786.2) Assessment Unchanged Still with persistent cough. Bronch 12/3 shows neutrophils in lavage but otherwise micro negative. No endobronchial lesion seen. I am worried that the infirlrates in RML from november are not resolved. I am not sure what it could be. ? FOCAL BOOP  plan await repeat ct chest get full pft depending on above, will consider steroids Orders: Est. Patient Level III [id number])  Medications Added to Medication List This Visit: 1)  Calcium-vitamin D 250-125 Mg-unit Tabs (Calcium carbonate-vitamin d) .... Take 1 tablet by mouth two times a day  Other Orders: Pulmonary Referral (Pulmonary)  Patient Instructions: 1)  I understand and am sorry that you still have symptoms of cough 2)  Please have full PFTs before next visit 3)  Pleas have your CT chest 4)  I will review both and decide next course   Immunization History:  Influenza Immunization History:    Influenza:  fluvax 3+ (10/30/2008)  Pneumovax Immunization History:    Pneumovax:  pneumovax (01/26/2007)

## 2010-02-26 NOTE — Miscellaneous (Signed)
Summary: nasacort change to fluticasone  Clinical Lists Changes   Medications: Changed medication from NASACORT AQ 55 MCG/ACT  AERS (TRIAMCINOLONE ACETONIDE(NASAL)) Two puffs each nostril daily to FLUTICASONE PROPIONATE 50 MCG/ACT SUSP (FLUTICASONE PROPIONATE) 2 sprays each nostril two times a day - Signed Rx of FLUTICASONE PROPIONATE 50 MCG/ACT SUSP (FLUTICASONE PROPIONATE) 2 sprays each nostril two times a day;  #1 x 6;  Signed;  Entered by: [place] Poisson CNA;  Authorized by: Brand Males MD;  Method used: Electronically to [facility] [phone]*, [address], [place], [place]  [address], Ph: [phone], Fax: [phone]    Prescriptions: FLUTICASONE PROPIONATE 50 MCG/ACT SUSP (FLUTICASONE PROPIONATE) 2 sprays each nostril two times a day  #1 x 6   Entered by:   Parke Poisson CNA   Authorized by:   Brand Males MD   Signed by:   Parke Poisson CNA on 03/13/2009   Method used:   Electronically to        [facility] [phone]* (retail)       [address]       [place], [place]  [address]       Ph: [phone]       Fax: [phone]   [id number]     received PA request for pt's nasacort with fluticasone, nasonex and veramyst as alternatives.  per MR okay to change pt to fluticasone 2 puffs each nostril two times a day with 6 refills.  rx sent to pharmacy. Parke Poisson CNA  March 13, 2009 2:52 PM

## 2010-04-28 LAB — AFB CULTURE WITH SMEAR (NOT AT ARMC): Acid Fast Smear: NONE SEEN

## 2010-04-28 LAB — LEGIONELLA PROFILE(CULTURE+DFA/SMEAR): Legionella Antigen (DFA): NEGATIVE

## 2010-04-28 LAB — BODY FLUID CELL COUNT WITH DIFFERENTIAL
Eos, Fluid: 3 %
Lymphs, Fluid: 19 %
Monocyte-Macrophage-Serous Fluid: 27 % — ABNORMAL LOW (ref 50–90)
Neutrophil Count, Fluid: 51 % — ABNORMAL HIGH (ref 0–25)
Total Nucleated Cell Count, Fluid: 720 cu mm (ref 0–1000)

## 2010-04-28 LAB — FUNGUS CULTURE W SMEAR: Fungal Smear: NONE SEEN

## 2010-04-28 LAB — CULTURE, RESPIRATORY W GRAM STAIN: Gram Stain: NONE SEEN

## 2010-04-28 LAB — PATHOLOGIST SMEAR REVIEW

## 2010-04-28 LAB — PNEUMOCYSTIS JIROVECI SMEAR BY DFA: Pneumocystis jiroveci Ag: NOT DETECTED

## 2010-05-26 ENCOUNTER — Other Ambulatory Visit: Payer: Self-pay | Admitting: *Deleted

## 2010-05-26 DIAGNOSIS — Z79899 Other long term (current) drug therapy: Secondary | ICD-10-CM

## 2010-05-26 DIAGNOSIS — E7800 Pure hypercholesterolemia, unspecified: Secondary | ICD-10-CM

## 2010-06-15 ENCOUNTER — Encounter: Payer: Self-pay | Admitting: Cardiovascular Disease

## 2010-06-15 DIAGNOSIS — R079 Chest pain, unspecified: Secondary | ICD-10-CM | POA: Insufficient documentation

## 2010-06-15 DIAGNOSIS — C50919 Malignant neoplasm of unspecified site of unspecified female breast: Secondary | ICD-10-CM | POA: Insufficient documentation

## 2010-06-15 DIAGNOSIS — I341 Nonrheumatic mitral (valve) prolapse: Secondary | ICD-10-CM | POA: Insufficient documentation

## 2010-06-15 DIAGNOSIS — E039 Hypothyroidism, unspecified: Secondary | ICD-10-CM | POA: Insufficient documentation

## 2010-06-15 DIAGNOSIS — E7800 Pure hypercholesterolemia, unspecified: Secondary | ICD-10-CM | POA: Insufficient documentation

## 2010-06-15 DIAGNOSIS — Z85038 Personal history of other malignant neoplasm of large intestine: Secondary | ICD-10-CM | POA: Insufficient documentation

## 2010-06-15 DIAGNOSIS — I4891 Unspecified atrial fibrillation: Secondary | ICD-10-CM | POA: Insufficient documentation

## 2010-06-15 DIAGNOSIS — R0602 Shortness of breath: Secondary | ICD-10-CM | POA: Insufficient documentation

## 2010-06-17 ENCOUNTER — Ambulatory Visit (INDEPENDENT_AMBULATORY_CARE_PROVIDER_SITE_OTHER): Payer: BC Managed Care – PPO | Admitting: Cardiovascular Disease

## 2010-06-17 ENCOUNTER — Other Ambulatory Visit (INDEPENDENT_AMBULATORY_CARE_PROVIDER_SITE_OTHER): Payer: MEDICARE | Admitting: *Deleted

## 2010-06-17 ENCOUNTER — Encounter: Payer: Self-pay | Admitting: Cardiovascular Disease

## 2010-06-17 DIAGNOSIS — E039 Hypothyroidism, unspecified: Secondary | ICD-10-CM

## 2010-06-17 DIAGNOSIS — Z79899 Other long term (current) drug therapy: Secondary | ICD-10-CM

## 2010-06-17 DIAGNOSIS — I059 Rheumatic mitral valve disease, unspecified: Secondary | ICD-10-CM

## 2010-06-17 DIAGNOSIS — E785 Hyperlipidemia, unspecified: Secondary | ICD-10-CM

## 2010-06-17 DIAGNOSIS — I341 Nonrheumatic mitral (valve) prolapse: Secondary | ICD-10-CM

## 2010-06-17 DIAGNOSIS — E7800 Pure hypercholesterolemia, unspecified: Secondary | ICD-10-CM

## 2010-06-17 LAB — BASIC METABOLIC PANEL
BUN: 19 mg/dL (ref 6–23)
CO2: 27 mEq/L (ref 19–32)
Calcium: 9.3 mg/dL (ref 8.4–10.5)
Chloride: 104 mEq/L (ref 96–112)
Creatinine, Ser: 0.9 mg/dL (ref 0.4–1.2)
GFR: 67.85 mL/min (ref >60.00)
Potassium: 4.6 mEq/L (ref 3.5–5.1)
Sodium: 139 mEq/L (ref 135–145)

## 2010-06-17 LAB — HEPATIC FUNCTION PANEL
ALT: 16 U/L (ref 0–35)
AST: 27 U/L (ref 0–37)
Albumin: 3.9 g/dL (ref 3.5–5.2)
Alkaline Phosphatase: 89 U/L (ref 39–117)
Bilirubin, Direct: 0.2 mg/dL (ref 0.0–0.3)
Total Bilirubin: 1 mg/dL (ref 0.3–1.2)
Total Protein: 7.4 g/dL (ref 6.0–8.3)

## 2010-06-17 LAB — LIPID PANEL
HDL: 61.3 mg/dL (ref >39.00)
Total CHOL/HDL Ratio: 4
Triglycerides: 122 mg/dL (ref 0.0–149.0)
VLDL: 24.4 mg/dL (ref 0.0–40.0)

## 2010-06-17 LAB — LDL CHOLESTEROL, DIRECT: Direct LDL: 156.7 mg/dL

## 2010-06-17 LAB — TSH: TSH: 0.82 u[IU]/mL (ref 0.35–5.50)

## 2010-06-17 NOTE — Progress Notes (Signed)
Jocelyn Moss Date of Birth  [birth year] [facility] / [facility] [id number]     [place], [place]  [address] [phone]  Fax  [phone]  History of Present Illness:  Jocelyn Moss is an elderly female with a history of mitral valve prolapse, hypothyroidism, and hypercholesterolemia. She has done very well since I last saw her. She is not having any episodes of chest pain or shortness of breath.  Current Outpatient Prescriptions on File Prior to Visit  Medication Sig Dispense Refill  . Calcium Carbonate-Vitamin D (CALCIUM + D PO) Take by mouth 2 (two) times daily.        Marland Kitchen levothyroxine (SYNTHROID, LEVOTHROID) 100 MCG tablet Take 100 mcg by mouth daily.        . metoprolol tartrate (LOPRESSOR) 25 MG tablet Take 25 mg by mouth 2 (two) times daily.        Marland Kitchen DISCONTD: Ascorbic Acid (VITAMIN C PO) Take by mouth daily.          Allergies  Allergen Reactions  . Crestor (Rosuvastatin Calcium)   . Rosuvastatin   . Zetia (Ezetimibe)     Past Medical History  Diagnosis Date  . MVP (mitral valve prolapse)   . Hypercholesterolemia   . History of colon cancer   . Hypothyroidism   . Chest pain   . Atrial fibrillation   . SOB (shortness of breath)   . Breast cancer 1990    Past Surgical History  Procedure Date  . Ileostomy   . Breast lumpectomy     History  Smoking status  . Never Smoker   Smokeless tobacco  . Not on file    History  Alcohol Use No    Family History  Problem Relation Age of Onset  . Lung cancer Mother   . Lung cancer Father   . Lung cancer Sister   . Breast cancer Sister     Reviw of Systems:  Reviewed in the HPI.  All other systems are negative.  Physical Exam: BP 140/92  Pulse 62  Ht 5\' 8"  (1.727 m)  Wt 161 lb 6.4 oz (73.211 kg)  BMI 24.54 kg/m2 The patient is alert and oriented x 3.  The mood and affect are normal.  The skin is warm and dry.  Color is normal.  The HEENT exam reveals that the  sclera are nonicteric.  The mucous membranes are moist.  The carotids are 2+ without bruits.  There is no thyromegaly.  There is no JVD.  The lungs are clear.  The chest wall is non tender.  The heart exam reveals a regular rate with a normal S1 and S2. She has a soft mid systolic click.  The PMI is not displaced.   Abdominal exam reveals good bowel sounds.  There is no guarding or rebound.  There is no hepatosplenomegaly or tenderness.  There are no masses.  Exam of the legs reveal no clubbing, cyanosis, or edema.  The legs are without rashes.  The distal pulses are intact.  Cranial nerves II - XII are intact.  Motor and sensory functions are intact.  The gait is normal.  Assessment / Plan:

## 2010-06-17 NOTE — Assessment & Plan Note (Signed)
Jocelyn Moss remains very stable.

## 2010-06-17 NOTE — Assessment & Plan Note (Signed)
We have drawn a TSH today. We will plan on refilling her Synthroid if her TSH levels are okay. She will be followup with [facility]  family practice. There are also have access to a the  TSH levels and they  may choose to manage her thyroid replacement.

## 2010-06-18 ENCOUNTER — Other Ambulatory Visit: Payer: Self-pay | Admitting: *Deleted

## 2010-06-18 MED ORDER — METOPROLOL TARTRATE 25 MG PO TABS: 25.0000 mg | ORAL_TABLET | Freq: Two times a day (BID) | ORAL | Status: DC

## 2010-06-18 NOTE — Telephone Encounter (Signed)
Patient called with lab results. Patient request refill.

## 2010-10-05 ENCOUNTER — Telehealth: Payer: Self-pay | Admitting: Cardiovascular Disease

## 2010-10-05 NOTE — Telephone Encounter (Signed)
Received med rec request from [facility] for [facility].  Requesting all records.  Printed Epic Records from 06/17/2010.  Everything else is in physical chart.  Chart placed on [facility] desk to be picked up on Tuesday with Epic copies attached.

## 2010-11-03 ENCOUNTER — Other Ambulatory Visit: Payer: Self-pay | Admitting: Obstetrics and Gynecology

## 2010-11-03 DIAGNOSIS — Z1231 Encounter for screening mammogram for malignant neoplasm of breast: Secondary | ICD-10-CM

## 2010-12-02 ENCOUNTER — Ambulatory Visit
Admission: RE | Admit: 2010-12-02 | Discharge: 2010-12-02 | Disposition: A | Discharge status: Home / Self Care | Payer: MEDICARE | Source: Ambulatory Visit | Attending: Obstetrics and Gynecology | Admitting: Obstetrics and Gynecology

## 2010-12-02 DIAGNOSIS — Z1231 Encounter for screening mammogram for malignant neoplasm of breast: Secondary | ICD-10-CM

## 2010-12-02 LAB — HM MAMMOGRAPHY

## 2011-05-25 ENCOUNTER — Other Ambulatory Visit (INDEPENDENT_AMBULATORY_CARE_PROVIDER_SITE_OTHER): Payer: BC Managed Care – PPO

## 2011-05-25 DIAGNOSIS — E785 Hyperlipidemia, unspecified: Secondary | ICD-10-CM

## 2011-05-25 LAB — BASIC METABOLIC PANEL
BUN: 18 mg/dL (ref 6–23)
Creatinine, Ser: 0.8 mg/dL (ref 0.4–1.2)
GFR: 72.46 mL/min (ref >60.00)
Glucose, Bld: 94 mg/dL (ref 70–99)
Potassium: 3.8 mEq/L (ref 3.5–5.1)

## 2011-05-25 LAB — HEPATIC FUNCTION PANEL
Albumin: 4 g/dL (ref 3.5–5.2)
Alkaline Phosphatase: 89 U/L (ref 39–117)
Total Protein: 7.6 g/dL (ref 6.0–8.3)

## 2011-05-25 LAB — LIPID PANEL
Cholesterol: 236 mg/dL — ABNORMAL HIGH (ref 0–200)
HDL: 65.7 mg/dL (ref >39.00)
Triglycerides: 135 mg/dL (ref 0.0–149.0)

## 2011-05-26 ENCOUNTER — Telehealth: Payer: Self-pay | Admitting: Cardiovascular Disease

## 2011-05-26 NOTE — Telephone Encounter (Signed)
Follow- up: ° ° °Patient returned your phone call. Please call back. °

## 2011-05-26 NOTE — Telephone Encounter (Signed)
DONE

## 2011-05-27 ENCOUNTER — Ambulatory Visit (INDEPENDENT_AMBULATORY_CARE_PROVIDER_SITE_OTHER): Payer: BC Managed Care – PPO | Admitting: Cardiovascular Disease

## 2011-05-27 ENCOUNTER — Encounter: Payer: Self-pay | Admitting: Cardiovascular Disease

## 2011-05-27 VITALS — BP 135/87 | HR 57 | Ht 67.0 in | Wt 168.0 lb

## 2011-05-27 DIAGNOSIS — I059 Rheumatic mitral valve disease, unspecified: Secondary | ICD-10-CM

## 2011-05-27 DIAGNOSIS — I4891 Unspecified atrial fibrillation: Secondary | ICD-10-CM

## 2011-05-27 DIAGNOSIS — E785 Hyperlipidemia, unspecified: Secondary | ICD-10-CM

## 2011-05-27 DIAGNOSIS — R079 Chest pain, unspecified: Secondary | ICD-10-CM

## 2011-05-27 DIAGNOSIS — I341 Nonrheumatic mitral (valve) prolapse: Secondary | ICD-10-CM

## 2011-05-27 MED ORDER — METOPROLOL TARTRATE 25 MG PO TABS: 25.0000 mg | ORAL_TABLET | Freq: Two times a day (BID) | ORAL | Status: DC

## 2011-05-27 NOTE — Assessment & Plan Note (Signed)
Jocelyn Moss continues to do well. Her mitral valve prolapse is very stable. I'll see her again in one year.

## 2011-05-27 NOTE — Progress Notes (Signed)
    Jocelyn Moss Date of Birth  [birth year]  [facility] [address] [place], [place]  [address]  [employer] [address] [place], [place]  Problem List 1. Mitral Valve Prolapse 2. Hyperlipidemia- intolerant to Crestor, Lipator, Zocor 3. History of colon cancer-status post ileostomy - 2000 4. Hypothyroidism 5. Breast cancer - 1990  History of Present Illness:  Jocelyn Moss is an elderly female with a history of mitral valve prolapse, hypothyroidism, and hypercholesterolemia. She has done very well since I last saw her. She is not having any episodes of chest pain or shortness of breath.  Current Outpatient Prescriptions on File Prior to Visit  Medication Sig Dispense Refill  . Calcium Carbonate-Vitamin D (CALCIUM + D PO) Take by mouth 2 (two) times daily.        Marland Kitchen levothyroxine (SYNTHROID, LEVOTHROID) 100 MCG tablet Take 100 mcg by mouth daily.        . metoprolol tartrate (LOPRESSOR) 25 MG tablet Take 1 tablet (25 mg total) by mouth 2 (two) times daily.  180 tablet  3    Allergies  Allergen Reactions  . Crestor (Rosuvastatin Calcium)   . Rosuvastatin   . Zetia (Ezetimibe)     Past Medical History  Diagnosis Date  . MVP (mitral valve prolapse)   . Hypercholesterolemia   . History of colon cancer   . Hypothyroidism   . Chest pain   . Atrial fibrillation   . SOB (shortness of breath)   . Breast cancer 1990    Past Surgical History  Procedure Date  . Ileostomy   . Breast lumpectomy     History  Smoking status  . Never Smoker   Smokeless tobacco  . Not on file    History  Alcohol Use No    Family History  Problem Relation Age of Onset  . Lung cancer Mother   . Lung cancer Father   . Lung cancer Sister   . Breast cancer Sister     Reviw of Systems:  Reviewed in the HPI.  All other systems are negative.  Physical Exam: BP 135/87  Pulse 57  Ht 5\' 7"  (1.702 m)  Wt 168 lb (76.204 kg)   BMI 26.31 kg/m2 The patient is alert and oriented x 3.  The mood and affect are normal.  The skin is warm and dry.  Color is normal.  The HEENT exam reveals that the sclera are nonicteric.  The mucous membranes are moist.  The carotids are 2+ without bruits.  There is no thyromegaly.  There is no JVD.  The lungs are clear.  The chest wall is non tender.  The heart exam reveals a regular rate with a normal S1 and S2. She has a soft mid systolic click.  The PMI is not displaced.   Abdominal exam reveals good bowel sounds.  There is no guarding or rebound.  There is no hepatosplenomegaly or tenderness.  There are no masses.  Exam of the legs reveal no clubbing, cyanosis, or edema.  The legs are without rashes.  The distal pulses are intact.  Cranial nerves II - XII are intact.  Motor and sensory functions are intact.  The gait is normal.  ECG: 05/27/2011-sinus bradycardia at 54 beats a minute. EKG is otherwise normal   Assessment / Plan:

## 2011-05-27 NOTE — Patient Instructions (Signed)
Your physician wants you to follow-up in: 1 year  You will receive a reminder letter in the mail two months in advance. If you don't receive a letter, please call our office to schedule the follow-up appointment.  Your physician recommends that you continue on your current medications as directed. Please refer to the Current Medication list given to you today.  

## 2011-05-27 NOTE — Assessment & Plan Note (Signed)
Her LDL cholesterol remains elevated. She has tried several statins and does not want to try any further stents. We talked about perhaps starting her on WelChol or perhaps Niaspan.  She was work on a good diet and exercise program before trying another medication .

## 2011-12-01 ENCOUNTER — Other Ambulatory Visit: Payer: Self-pay | Admitting: Obstetrics and Gynecology

## 2011-12-01 DIAGNOSIS — Z1231 Encounter for screening mammogram for malignant neoplasm of breast: Secondary | ICD-10-CM

## 2012-01-13 ENCOUNTER — Ambulatory Visit
Admission: RE | Admit: 2012-01-13 | Discharge: 2012-01-13 | Disposition: A | Discharge status: Home / Self Care | Payer: BC Managed Care – PPO | Source: Ambulatory Visit | Attending: Obstetrics and Gynecology | Admitting: Obstetrics and Gynecology

## 2012-01-13 DIAGNOSIS — Z1231 Encounter for screening mammogram for malignant neoplasm of breast: Secondary | ICD-10-CM

## 2012-02-16 ENCOUNTER — Other Ambulatory Visit: Payer: Self-pay | Admitting: Dermatology

## 2012-03-15 ENCOUNTER — Encounter: Payer: Self-pay | Admitting: Family Medicine

## 2012-03-15 DIAGNOSIS — M8580 Other specified disorders of bone density and structure, unspecified site: Secondary | ICD-10-CM | POA: Insufficient documentation

## 2012-04-11 ENCOUNTER — Encounter: Payer: Self-pay | Admitting: Physician Assistant

## 2012-04-11 ENCOUNTER — Ambulatory Visit
Admission: RE | Admit: 2012-04-11 | Discharge: 2012-04-11 | Disposition: A | Discharge status: Home / Self Care | Payer: BC Managed Care – PPO | Source: Ambulatory Visit | Attending: Physician Assistant | Admitting: Physician Assistant

## 2012-04-11 ENCOUNTER — Ambulatory Visit (INDEPENDENT_AMBULATORY_CARE_PROVIDER_SITE_OTHER): Payer: BC Managed Care – PPO | Admitting: Physician Assistant

## 2012-04-11 VITALS — BP 124/80 | HR 76 | Temp 97.5°F | Resp 18 | Ht 65.5 in | Wt 162.0 lb

## 2012-04-11 DIAGNOSIS — R05 Cough: Secondary | ICD-10-CM

## 2012-04-11 DIAGNOSIS — E039 Hypothyroidism, unspecified: Secondary | ICD-10-CM

## 2012-04-11 DIAGNOSIS — R053 Chronic cough: Secondary | ICD-10-CM

## 2012-04-11 DIAGNOSIS — R059 Cough, unspecified: Secondary | ICD-10-CM

## 2012-04-11 LAB — TSH: TSH: 0.049 u[IU]/mL — ABNORMAL LOW (ref 0.350–4.500)

## 2012-04-11 MED ORDER — BENZONATATE 200 MG PO CAPS: 200.0000 mg | ORAL_CAPSULE | Freq: Two times a day (BID) | ORAL | Status: DC | PRN

## 2012-04-11 NOTE — Progress Notes (Signed)
Patient ID: Jocelyn Moss MRN: [id number], DOB: [birth year], 77 y.o. Date of Encounter: @DATE @  Chief Complaint: Here for routine recheck of thyroid. Also, still has cough.  HPI: 77 y.o. year old female  presents for: 1. Recheck hypothyroid. Is taking synthroid 153mcg as directed. Has had no palpitations, weight changes, etc. 2. Has persistent cough. Has had two antibiotics. 01/24/12 I prescribed Azithromycin. 02/28/12 I prescribed Levaquin. Pt states she still has cough productive of chuncks of gray/yellow/green phlegm. Says same thing happened in past. I reviewed record. In November 2010 she had abnormal CXR, Chest CT, Eval by Pulmonary, including bronchoscopy.  According to pt, pulm tried multiple different meds, etc..nothing ever really worked. Cough eventually resolved. Stayed resolved until it just reocccurred 12/2011.    Past Medical History  Diagnosis Date  . MVP (mitral valve prolapse)   . Hypercholesterolemia   . History of colon cancer   . Hypothyroidism   . Chest pain   . Atrial fibrillation   . SOB (shortness of breath)   . Breast cancer 1990  . Colon cancer     ileostomy 2000  . Osteoporosis     osteopenia     Home Meds: Prior to Admission medications   Medication Sig Start Date End Date Taking? Authorizing Provider  cetirizine-pseudoephedrine (ZYRTEC-D) 5-120 MG per tablet Take 1 tablet by mouth 2 (two) times daily.   Yes Historical Provider, MD  levothyroxine (SYNTHROID, LEVOTHROID) 125 MCG tablet Take 125 mcg by mouth daily. 03/02/12  Yes Historical Provider, MD  benzonatate (TESSALON) 200 MG capsule Take 1 capsule (200 mg total) by mouth 2 (two) times daily as needed for cough. 04/11/12   Lonie Peak Edgar Reisz, PA-C  Calcium Carbonate-Vitamin D (CALCIUM + D PO) Take by mouth 2 (two) times daily.      Historical Provider, MD  levothyroxine (SYNTHROID, LEVOTHROID) 100 MCG tablet Take by mouth daily.  03/02/12   Historical Provider, MD  metoprolol tartrate (LOPRESSOR) 25 MG  tablet Take 1 tablet (25 mg total) by mouth 2 (two) times daily. 05/27/11   Thayer Headings, MD    Allergies:  Allergies  Allergen Reactions  . Crestor (Rosuvastatin Calcium)   . Rosuvastatin   . Zetia (Ezetimibe)     History   Social History  . Marital Status: Married    Spouse Name: N/A    Number of Children: N/A  . Years of Education: N/A   Occupational History  . Not on file.   Social History Main Topics  . Smoking status: Never Smoker   . Smokeless tobacco: Never Used  . Alcohol Use: No  . Drug Use: No  . Sexually Active: Not on file   Other Topics Concern  . Not on file   Social History Narrative  . No narrative on file     Review of Systems:.  Constitutional: negative for chills, fever, night sweats, weight changes, or fatigue  HEENT: negative for vision changes, hearing loss, congestion, rhinorrhea, ST, epistaxis, or sinus pressure. Has no mucus from nose. No nasal congestion. Cardiovascular: negative for chest pain or palpitations. No new/increased shortness of breath or dyspnea on exertion Respiratory: negative for hemoptysis, wheezing, shortness of breath. See HPI regarding cough. Abdominal: negative for abdominal pain, nausea, vomiting, diarrhea, or constipation Dermatological: negative for rash or concerning skin lesions Neurologic: negative for headache, dizziness, or syncope All other systems reviewed and are otherwise negative with the exception to those above and in the HPI.   Physical Exam: Blood pressure  124/80, pulse 76, temperature 97.5 F (36.4 C), temperature source Oral, resp. rate 18, height 5' 5.5" (1.664 m), weight 162 lb (73.483 kg)., Body mass index is 26.54 kg/(m^2). General: Well developed, well nourished, in no acute distress. Head: Normocephalic, atraumatic, eyes without discharge, sclera non-icteric, nares are without discharge. Bilateral auditory canals clear, TM's are without perforation, pearly grey and translucent with reflective  cone of light bilaterally. Oral cavity moist, posterior pharynx without exudate, erythema, peritonsillar abscess, or post nasal drip.  Neck: Supple. No thyromegaly. Full ROM. No lymphadenopathy. Lungs: Clear bilaterally to auscultation without wheezes, rales, or rhonchi. Breathing is unlabored. Heart: RRR with S1 S2. No murmurs, rubs, or gallops. Abdomen: Soft, non-tender, non-distended with normoactive bowel sounds. No hepatomegaly. No rebound/guarding. No obvious abdominal masses. Musculoskeletal:  Strength and tone normal for age. Extremities/Skin: Warm and dry. No clubbing or cyanosis. No edema. No rashes or suspicious lesions. Neuro: Alert and oriented X 3. Moves all extremities spontaneously. Gait is normal. CNII-XII grossly in tact. Psych:  Responds to questions appropriately with a normal affect.   Labs:   ASSESSMENT AND PLAN:  77 y.o. year old female with:  1. Hypothyroidism: Check TSH then adjust dose of synthroid if needed. 2. Chronic Cough: s/p two antibiotics: Azithromycin and Levaquin.                               Has h/o similar symptoms in 11/2008. At that time had abnormal CXR, CT. Had F/U with pulmonary and had bronch.                               Will Rx [employer]                               Will order CXRay.                               Will order Pulmonary referral.    Signed, Karis Juba, [place], [facility] 04/11/2012 9:27 AM

## 2012-04-12 ENCOUNTER — Telehealth: Payer: Self-pay | Admitting: Family Medicine

## 2012-04-12 DIAGNOSIS — E039 Hypothyroidism, unspecified: Secondary | ICD-10-CM

## 2012-04-12 MED ORDER — LEVOTHYROXINE SODIUM 112 MCG PO TABS: 112.0000 ug | ORAL_TABLET | Freq: Every day | ORAL | Status: DC

## 2012-04-12 NOTE — Telephone Encounter (Signed)
Message copied by Olena Mater on Wed Apr 12, 2012 12:39 PM ------      Message from: Dena Billet      Created: Wed Apr 12, 2012  9:54 AM       Levothyroxine dose needs to be changed to 110mcg one po QD.      Call pt.       Call in RX #30. 2 refills.      Put in order for repeat TSH in 6 weeks. ------

## 2012-04-12 NOTE — Addendum Note (Signed)
Addended by: Dena Billet on: 04/12/2012 09:50 AM   Modules accepted: Orders

## 2012-04-12 NOTE — Telephone Encounter (Signed)
Medication ordered per Doreen Beam PAC.  Patient called and informed.

## 2012-04-28 ENCOUNTER — Institutional Professional Consult (permissible substitution): Payer: BC Managed Care – PPO | Admitting: Internal Medicine

## 2012-05-04 ENCOUNTER — Ambulatory Visit: Payer: BC Managed Care – PPO | Admitting: Internal Medicine

## 2012-06-05 ENCOUNTER — Ambulatory Visit (INDEPENDENT_AMBULATORY_CARE_PROVIDER_SITE_OTHER): Payer: BC Managed Care – PPO | Admitting: Family Medicine

## 2012-06-05 ENCOUNTER — Encounter: Payer: Self-pay | Admitting: Family Medicine

## 2012-06-05 VITALS — BP 130/80 | HR 90 | Temp 98.2°F | Resp 16 | Wt 161.0 lb

## 2012-06-05 DIAGNOSIS — R053 Chronic cough: Secondary | ICD-10-CM

## 2012-06-05 DIAGNOSIS — R05 Cough: Secondary | ICD-10-CM

## 2012-06-05 DIAGNOSIS — R058 Other specified cough: Secondary | ICD-10-CM

## 2012-06-05 DIAGNOSIS — R059 Cough, unspecified: Secondary | ICD-10-CM

## 2012-06-05 MED ORDER — FLUTICASONE PROPIONATE 50 MCG/ACT NA SUSP: 2.0000 | Freq: Every day | NASAL | Status: DC

## 2012-06-05 MED ORDER — HYDROCODONE-HOMATROPINE 5-1.5 MG/5ML PO SYRP: 5.0000 mL | ORAL_SOLUTION | Freq: Four times a day (QID) | ORAL | Status: DC | PRN

## 2012-06-05 NOTE — Progress Notes (Signed)
Subjective:    Patient ID: Jocelyn Moss, female    DOB: 1935-04-18, 77 y.o.   MRN: [id number]  HPI Patient has had persistent daily cough since last October.  She has been seen and treated as a respiratory infection on several occasions with different antibiotics. Symptoms have never improved nor have they worsened.  She denies fevers chills or shortness of breath. She states the cough is productive of thick white mucous..  the cough is worse at night when she is supine. She denies any postnasal drip. She denies any allergy symptoms. She denies any heartburn. She denies any hemoptysis. She denies a weight loss. I reviewed her recent chest x-ray which revealed no pulmonary pathology.  Prudency saw the patient in May 2011 for similar symptoms. At that time I put patient on dexilant and her symptoms subsided.  However I also gave her Avelox at that office visit for possible sinus infection. Past Medical History  Diagnosis Date  . MVP (mitral valve prolapse)   . Hypercholesterolemia   . History of colon cancer   . Hypothyroidism   . Chest pain   . Atrial fibrillation   . SOB (shortness of breath)   . Breast cancer 1990  . Colon cancer     ileostomy 2000  . Osteoporosis     osteopenia   Current Outpatient Prescriptions on File Prior to Visit  Medication Sig Dispense Refill  . Calcium Carbonate-Vitamin D (CALCIUM + D PO) Take by mouth 2 (two) times daily.        Marland Kitchen levothyroxine (SYNTHROID, LEVOTHROID) 112 MCG tablet Take 1 tablet (112 mcg total) by mouth daily.  90 tablet  0  . metoprolol tartrate (LOPRESSOR) 25 MG tablet Take 1 tablet (25 mg total) by mouth 2 (two) times daily.  180 tablet  3   No current facility-administered medications on file prior to visit.   Allergies  Allergen Reactions  . Crestor (Rosuvastatin Calcium)   . Rosuvastatin   . Zetia (Ezetimibe)    History   Social History  . Marital Status: Married    Spouse Name: N/A    Number of Children: N/A  . Years of  Education: N/A   Occupational History  . Not on file.   Social History Main Topics  . Smoking status: Never Smoker   . Smokeless tobacco: Never Used  . Alcohol Use: No  . Drug Use: No  . Sexually Active: Not on file   Other Topics Concern  . Not on file   Social History Narrative  . No narrative on file   Family History  Problem Relation Age of Onset  . Lung cancer Mother   . Lung cancer Father   . Lung cancer Sister   . Breast cancer Sister       Review of Systems    review of systems is otherwise negative Objective:   Physical Exam  Constitutional: She appears well-developed and well-nourished.  HENT:  Head: Normocephalic.  Right Ear: External ear normal.  Left Ear: External ear normal.  Nose: Nose normal.  Mouth/Throat: Oropharynx is clear and moist.  Eyes: Conjunctivae are normal. Pupils are equal, round, and reactive to light.  Neck: Neck supple. No JVD present. No thyromegaly present.  Cardiovascular: Normal rate, regular rhythm and normal heart sounds.   Pulmonary/Chest: Effort normal and breath sounds normal. No respiratory distress. She has no wheezes. She has no rales. She exhibits no tenderness.  Abdominal: Soft. Bowel sounds are normal. She exhibits no distension  and no mass. There is no tenderness. There is no rebound and no guarding.  Lymphadenopathy:    She has no cervical adenopathy.          Assessment & Plan:  1. Chronic cough 2. Upper airway cough syndrome Begin Flonase 2 sprays each nostril inhaled daily And Nexium 40 mg by mouth daily Elevate the head of the bed 2-3 inches Triy to break the cycle of coughing using Hycodan 5 mL's by mouth every 6 hours when necessary cough. She is a very strong family history of asthma. Also known as Symbicort 160/4.52 puffs inhaled twice a day for possible cough variant asthma. I would like to see the patient back in 2 weeks to reassess.

## 2012-06-21 ENCOUNTER — Encounter: Payer: Self-pay | Admitting: Family Medicine

## 2012-06-21 ENCOUNTER — Ambulatory Visit (INDEPENDENT_AMBULATORY_CARE_PROVIDER_SITE_OTHER): Payer: BC Managed Care – PPO | Admitting: Family Medicine

## 2012-06-21 VITALS — BP 146/88 | HR 68 | Temp 98.1°F | Resp 14 | Wt 158.0 lb

## 2012-06-21 DIAGNOSIS — R059 Cough, unspecified: Secondary | ICD-10-CM

## 2012-06-21 DIAGNOSIS — R058 Other specified cough: Secondary | ICD-10-CM

## 2012-06-21 DIAGNOSIS — R053 Chronic cough: Secondary | ICD-10-CM

## 2012-06-21 DIAGNOSIS — R05 Cough: Secondary | ICD-10-CM

## 2012-06-21 MED ORDER — ESOMEPRAZOLE MAGNESIUM 20 MG PO CPDR: 40.0000 mg | DELAYED_RELEASE_CAPSULE | Freq: Every day | ORAL | Status: DC

## 2012-06-21 NOTE — Progress Notes (Signed)
Subjective:    Patient ID: Jocelyn Moss, female    DOB: [birth year], 77 y.o.   MRN: [id number]  HPI 06/05/12 Patient has had persistent daily cough since last October.  She has been seen and treated as a respiratory infection on several occasions with different antibiotics. Symptoms have never improved nor have they worsened.  She denies fevers chills or shortness of breath. She states the cough is productive of thick white mucous..  the cough is worse at night when she is supine. She denies any postnasal drip. She denies any allergy symptoms. She denies any heartburn. She denies any hemoptysis. She denies a weight loss. I reviewed her recent chest x-ray which revealed no pulmonary pathology.  Prudency saw the patient in May 2011 for similar symptoms. At that time I put patient on dexilant and her symptoms subsided.  However I also gave her Avelox at that office visit for possible sinus infection.  Therefore, last time, I prescribed: 1. Chronic cough 2. Upper airway cough syndrome Begin Flonase 2 sprays each nostril inhaled daily And Nexium 40 mg by mouth daily Elevate the head of the bed 2-3 inches Triy to break the cycle of coughing using Hycodan 5 mL's by mouth every 6 hours when necessary cough. She is a very strong family history of asthma. Also known as Symbicort 160/4.52 puffs inhaled twice a day for possible cough variant asthma. I would like to see the patient back in 2 weeks to reassess.  06/21/12 Patient states the cough is 70% better. She is no longer coughing during the day. She'll has occasional episodes at night. She is very happy with the current therapy. She is having no side effects. She denies any hemoptysis, weight loss, fevers, or chills.  Past Medical History  Diagnosis Date  . MVP (mitral valve prolapse)   . Hypercholesterolemia   . History of colon cancer   . Hypothyroidism   . Chest pain   . Atrial fibrillation   . SOB (shortness of breath)   . Breast cancer 1990   . Colon cancer     ileostomy 2000  . Osteoporosis     osteopenia   Current Outpatient Prescriptions on File Prior to Visit  Medication Sig Dispense Refill  . Calcium Carbonate-Vitamin D (CALCIUM + D PO) Take by mouth 2 (two) times daily.        . fluticasone (FLONASE) 50 MCG/ACT nasal spray Place 2 sprays into the nose daily.  16 g  6  . HYDROcodone-homatropine (HYCODAN) 5-1.5 MG/5ML syrup Take 5 mLs by mouth every 6 (six) hours as needed for cough.  120 mL  0  . levothyroxine (SYNTHROID, LEVOTHROID) 112 MCG tablet Take 1 tablet (112 mcg total) by mouth daily.  90 tablet  0  . metoprolol tartrate (LOPRESSOR) 25 MG tablet Take 1 tablet (25 mg total) by mouth 2 (two) times daily.  180 tablet  3   No current facility-administered medications on file prior to visit.   Allergies  Allergen Reactions  . Crestor (Rosuvastatin Calcium)   . Rosuvastatin   . Zetia (Ezetimibe)    History   Social History  . Marital Status: Married    Spouse Name: N/A    Number of Children: N/A  . Years of Education: N/A   Occupational History  . Not on file.   Social History Main Topics  . Smoking status: Never Smoker   . Smokeless tobacco: Never Used  . Alcohol Use: No  . Drug Use: No  .  Sexually Active: Not on file   Other Topics Concern  . Not on file   Social History Narrative  . No narrative on file   Family History  Problem Relation Age of Onset  . Lung cancer Mother   . Lung cancer Father   . Lung cancer Sister   . Breast cancer Sister       Review of Systems     review of systems is otherwise negative Objective:   Physical Exam  Constitutional: She appears well-developed and well-nourished.  HENT:  Head: Normocephalic.  Right Ear: External ear normal.  Left Ear: External ear normal.  Nose: Nose normal.  Mouth/Throat: Oropharynx is clear and moist.  Eyes: Conjunctivae are normal. Pupils are equal, round, and reactive to light.  Neck: Neck supple. No JVD present. No  thyromegaly present.  Cardiovascular: Normal rate, regular rhythm and normal heart sounds.   Pulmonary/Chest: Effort normal and breath sounds normal. No respiratory distress. She has no wheezes. She has no rales. She exhibits no tenderness.  Abdominal: Soft. Bowel sounds are normal. She exhibits no distension and no mass. There is no tenderness. There is no rebound and no guarding.  Lymphadenopathy:    She has no cervical adenopathy.          Assessment & Plan:  1. Chronic cough 2. Upper airway cough syndrome Continue Nexium 40 mg by mouth daily. Recheck in one month. Cough has completely subsided I will have her stop the Symbicort as well as the Flonase and maintain Nexium permanently.

## 2012-07-05 ENCOUNTER — Telehealth: Payer: Self-pay | Admitting: Family Medicine

## 2012-07-05 NOTE — Telephone Encounter (Signed)
Would pantoprazole 40 mg poqday be cheaper? She can take that OR 40 mg of nexium

## 2012-07-06 NOTE — Telephone Encounter (Signed)
Pt would like to try the Pantoprazole 40 mg. She would like to come pick it up from here and take it to different pharmacies to see which is cheaper. Can you write it out for her?

## 2012-07-07 MED ORDER — PANTOPRAZOLE SODIUM 40 MG PO TBEC: 40.0000 mg | DELAYED_RELEASE_TABLET | Freq: Every day | ORAL | Status: DC

## 2012-07-07 NOTE — Telephone Encounter (Signed)
Pt aware to pick up

## 2012-07-07 NOTE — Telephone Encounter (Signed)
Sure, rx is on my desk

## 2012-07-10 ENCOUNTER — Telehealth: Payer: Self-pay | Admitting: Physician Assistant

## 2012-07-10 DIAGNOSIS — E039 Hypothyroidism, unspecified: Secondary | ICD-10-CM

## 2012-07-10 MED ORDER — LEVOTHYROXINE SODIUM 112 MCG PO TABS: 112.0000 ug | ORAL_TABLET | Freq: Every day | ORAL | Status: DC

## 2012-07-10 NOTE — Telephone Encounter (Signed)
Medication refilled per protocol. 

## 2012-07-14 ENCOUNTER — Other Ambulatory Visit: Payer: Self-pay | Admitting: *Deleted

## 2012-07-14 ENCOUNTER — Telehealth: Payer: Self-pay | Admitting: Family Medicine

## 2012-07-14 MED ORDER — METOPROLOL TARTRATE 25 MG PO TABS: 25.0000 mg | ORAL_TABLET | Freq: Two times a day (BID) | ORAL | Status: DC

## 2012-07-14 MED ORDER — BENZONATATE 200 MG PO CAPS: 200.0000 mg | ORAL_CAPSULE | Freq: Three times a day (TID) | ORAL | Status: DC

## 2012-07-14 NOTE — Telephone Encounter (Signed)
LMTRC

## 2012-07-14 NOTE — Telephone Encounter (Signed)
Rx Refilled  

## 2012-07-14 NOTE — Telephone Encounter (Signed)
Tessalon perles 200 q 8 hrs.  IS IT COMING BACK?  DO WE NEED TO RESUME FLONASE AND SYMBICORT?

## 2012-07-14 NOTE — Telephone Encounter (Signed)
She has improved some, but she wanted you to know that what she is coughing up now is green and not white as it used to be. She can see some improvement with the Nexium though. She said that she has not taken the flonase or symbicort since she last saw you. Do you want her to continue it or just take the cough medication.

## 2012-07-14 NOTE — Telephone Encounter (Signed)
I want her to resume the flonase since cough is worsening after stopping it.  Continue acid medicine.

## 2012-07-17 ENCOUNTER — Telehealth: Payer: Self-pay | Admitting: Family Medicine

## 2012-07-17 MED ORDER — BUDESONIDE-FORMOTEROL FUMARATE 160-4.5 MCG/ACT IN AERO: 2.0000 | INHALATION_SPRAY | Freq: Two times a day (BID) | RESPIRATORY_TRACT | Status: DC

## 2012-07-17 NOTE — Telephone Encounter (Signed)
Rx Refilled  

## 2012-07-18 ENCOUNTER — Ambulatory Visit: Payer: BC Managed Care – PPO | Admitting: Internal Medicine

## 2012-07-31 ENCOUNTER — Telehealth: Payer: Self-pay | Admitting: Physician Assistant

## 2012-07-31 MED ORDER — CIPROFLOXACIN HCL 500 MG PO TABS: 500.0000 mg | ORAL_TABLET | Freq: Two times a day (BID) | ORAL | Status: DC

## 2012-07-31 NOTE — Telephone Encounter (Signed)
Agree with note by Jocelyn Moss.  After my prior message of "NTBS" , Jocelyn Moss informed me that our schedule is full all week.  I asked her to call pt and document curren tsx and if c/w UTI, then send in Cipro.  Approved.

## 2012-07-31 NOTE — Telephone Encounter (Signed)
NTBS.

## 2012-07-31 NOTE — Telephone Encounter (Signed)
Patient C/O a lot of frequency and urgency.  Tried some OTC [facility] brand UTI treatment--has helped very little.  Per provider  Cipro 500 mg BID x 7 days sent to pharmacy.  Pt aware if this does not resolve issue, we will need to see her in office.

## 2012-08-15 ENCOUNTER — Other Ambulatory Visit: Payer: Self-pay | Admitting: Physician Assistant

## 2012-08-15 NOTE — Telephone Encounter (Signed)
Pt coming in tomorrow for blood work

## 2012-08-16 ENCOUNTER — Other Ambulatory Visit: Payer: BC Managed Care – PPO

## 2012-08-16 DIAGNOSIS — E039 Hypothyroidism, unspecified: Secondary | ICD-10-CM

## 2012-08-17 ENCOUNTER — Telehealth: Payer: Self-pay | Admitting: Family Medicine

## 2012-08-17 DIAGNOSIS — E039 Hypothyroidism, unspecified: Secondary | ICD-10-CM

## 2012-08-17 LAB — TSH: TSH: 0.168 u[IU]/mL — ABNORMAL LOW (ref 0.350–4.500)

## 2012-08-17 NOTE — Telephone Encounter (Signed)
New Rx to pharmacy.  Lab ordered.  Left pt mess to call back for results

## 2012-08-17 NOTE — Telephone Encounter (Signed)
Pt called back  Aware of lab results, new RX and repeat labs 6 wks

## 2012-08-17 NOTE — Telephone Encounter (Signed)
Message copied by Olena Mater on Thu Aug 17, 2012 12:06 PM ------      Message from: Dena Billet      Created: Thu Aug 17, 2012  8:04 AM       Reviewed chart.      At [place] 04/11/12 was on 164mcg.                 04/11/12 TSH was o.o49.      Dose increased to 112 mcg.      Lab better but still not qute nml.       Increase to 125 mcg.       Send Rx: Synthroid 125 mcg one po QD # 30/one.      Order: TSH future-6 weeks ------

## 2012-08-17 NOTE — Telephone Encounter (Signed)
Labs back adjustment made to dose per provider and new Rx to pharmacy

## 2012-08-28 ENCOUNTER — Encounter: Payer: Self-pay | Admitting: Cardiovascular Disease

## 2012-08-29 ENCOUNTER — Encounter: Payer: Self-pay | Admitting: Cardiovascular Disease

## 2012-09-27 ENCOUNTER — Other Ambulatory Visit: Payer: Medicare Other

## 2012-09-27 DIAGNOSIS — E039 Hypothyroidism, unspecified: Secondary | ICD-10-CM

## 2012-09-28 ENCOUNTER — Telehealth: Payer: Self-pay | Admitting: Family Medicine

## 2012-09-28 DIAGNOSIS — E039 Hypothyroidism, unspecified: Secondary | ICD-10-CM

## 2012-09-28 MED ORDER — LEVOTHYROXINE SODIUM 100 MCG PO TABS: 100.0000 ug | ORAL_TABLET | Freq: Every day | ORAL | Status: DC

## 2012-09-28 NOTE — Telephone Encounter (Signed)
Patient aware of thyroid lab result.  Understand medication adjustment and need repeat labs in 6 weeks.  Rx to pharmacy and future lab ordered

## 2012-09-28 NOTE — Telephone Encounter (Signed)
Message copied by Olena Mater on Thu Sep 28, 2012 10:40 AM ------      Message from: Dena Billet      Created: Thu Sep 28, 2012  7:49 AM       She is currently on 125 mcg QD.       Decrease to 118mcg QD. Recheck TSH 6 weeks. ------

## 2012-10-12 ENCOUNTER — Ambulatory Visit (INDEPENDENT_AMBULATORY_CARE_PROVIDER_SITE_OTHER): Payer: BC Managed Care – PPO | Admitting: Family Medicine

## 2012-10-12 ENCOUNTER — Encounter: Payer: Self-pay | Admitting: Family Medicine

## 2012-10-12 VITALS — BP 110/62 | HR 60 | Temp 97.7°F | Resp 16 | Ht 67.0 in | Wt 163.0 lb

## 2012-10-12 DIAGNOSIS — Z23 Encounter for immunization: Secondary | ICD-10-CM

## 2012-10-12 DIAGNOSIS — Z8709 Personal history of other diseases of the respiratory system: Secondary | ICD-10-CM

## 2012-10-12 MED ORDER — OMEPRAZOLE 40 MG PO CPDR: 40.0000 mg | DELAYED_RELEASE_CAPSULE | Freq: Every day | ORAL | Status: DC

## 2012-10-12 NOTE — Progress Notes (Signed)
Subjective:    Patient ID: Jocelyn Moss, female    DOB: 01-27-1935, 77 y.o.   MRN: [id number]  HPI  06/05/12 Patient has had persistent daily cough since last October.  She has been seen and treated as a respiratory infection on several occasions with different antibiotics. Symptoms have never improved nor have they worsened.  She denies fevers chills or shortness of breath. She states the cough is productive of thick white mucous..  the cough is worse at night when she is supine. She denies any postnasal drip. She denies any allergy symptoms. She denies any heartburn. She denies any hemoptysis. She denies a weight loss. I reviewed her recent chest x-ray which revealed no pulmonary pathology.  Prudency saw the patient in May 2011 for similar symptoms. At that time I put patient on dexilant and her symptoms subsided.  However I also gave her Avelox at that office visit for possible sinus infection.  Therefore, last time, I prescribed: 1. Chronic cough 2. Upper airway cough syndrome Begin Flonase 2 sprays each nostril inhaled daily And Nexium 40 mg by mouth daily Elevate the head of the bed 2-3 inches Triy to break the cycle of coughing using Hycodan 5 mL's by mouth every 6 hours when necessary cough. She is a very strong family history of asthma. Also known as Symbicort 160/4.52 puffs inhaled twice a day for possible cough variant asthma. I would like to see the patient back in 2 weeks to reassess.  06/21/12 Patient states the cough is 70% better. She is no longer coughing during the day. She'll has occasional episodes at night. She is very happy with the current therapy. She is having no side effects. She denies any hemoptysis, weight loss, fevers, or chills.  10/12/12 In the interval, the patient discontinued Symbicort.  She decreased the Nexium to 20 mg a day and was buying over-the-counter for cost.  The cough returned although not as bad. When she resumed it Symbicort the cough has been 100%  better the last several months. She is now using Symbicort on an as needed basis when the cough rarely flares up.  She continues to take Nexium 20 mg every day. She is due for a flu shot. She has had the pneumonia vaccine. She never needs a colonoscopy. Her mammogram and Pap smear are up-to-date. Past Medical History  Diagnosis Date  . MVP (mitral valve prolapse)   . Hypercholesterolemia   . History of colon cancer   . Hypothyroidism   . Chest pain   . Atrial fibrillation   . SOB (shortness of breath)   . Breast cancer 1990  . Colon cancer     ileostomy 2000  . Osteoporosis     osteopenia   Past Surgical History  Procedure Laterality Date  . Ileostomy    . Breast lumpectomy    . Colon surgery  2000    ileostomy   Current Outpatient Prescriptions on File Prior to Visit  Medication Sig Dispense Refill  . budesonide-formoterol (SYMBICORT) 160-4.5 MCG/ACT inhaler Inhale 2 puffs into the lungs 2 (two) times daily.  1 Inhaler  5  . Calcium Carbonate-Vitamin D (CALCIUM + D PO) Take by mouth 2 (two) times daily.        Marland Kitchen levothyroxine (SYNTHROID, LEVOTHROID) 100 MCG tablet Take 1 tablet (100 mcg total) by mouth daily.  90 tablet  0  . metoprolol tartrate (LOPRESSOR) 25 MG tablet Take 1 tablet (25 mg total) by mouth 2 (two) times daily.  180 tablet  3   No current facility-administered medications on file prior to visit.   Allergies  Allergen Reactions  . Crestor [Rosuvastatin Calcium]   . Rosuvastatin   . Zetia [Ezetimibe]    History   Social History  . Marital Status: Married    Spouse Name: N/A    Number of Children: N/A  . Years of Education: N/A   Occupational History  . Not on file.   Social History Main Topics  . Smoking status: Never Smoker   . Smokeless tobacco: Never Used  . Alcohol Use: No  . Drug Use: No  . Sexual Activity: Not on file   Other Topics Concern  . Not on file   Social History Narrative  . No narrative on file   Family History  Problem  Relation Age of Onset  . Lung cancer Mother   . Lung cancer Father   . Lung cancer Sister   . Breast cancer Sister      Review of Systems  All other systems reviewed and are negative.       Objective:   Physical Exam  Vitals reviewed. Constitutional: She appears well-developed and well-nourished.  HENT:  Right Ear: External ear normal.  Left Ear: External ear normal.  Nose: Nose normal.  Mouth/Throat: Oropharynx is clear and moist. No oropharyngeal exudate.  Eyes: Conjunctivae are normal. Pupils are equal, round, and reactive to light. No scleral icterus.  Neck: Normal range of motion. Neck supple. No thyromegaly present.  Cardiovascular: Normal rate, regular rhythm and normal heart sounds.  Exam reveals no gallop and no friction rub.   No murmur heard. Pulmonary/Chest: Effort normal and breath sounds normal. No respiratory distress. She has no wheezes. She has no rales.  Abdominal: Soft. Bowel sounds are normal. She exhibits no distension. There is no tenderness. There is no rebound and no guarding.  Lymphadenopathy:    She has no cervical adenopathy.          Assessment & Plan:  1. Need for prophylactic vaccination and inoculation against influenza - Flu Vaccine QUAD 36+ mos IM  2. History of chronic cough This cough is more contact for you. I believe the largest contributing factor is gastroesophageal reflux disease. For financial reasons I have switched the patient omeprazole 40 mg by mouth daily so that she does not have to continue to buy over-the-counter Nexium.  I would like her to continue this medication indefinitely. I also feel she has a cough variant asthma that is likely exacerbated by her acid reflux. I am okay with her using Symbicort episodically even though that is not an indication for that medicine.  I recommended a bone density scan. She will obtain this through her gynecologist.

## 2012-10-18 ENCOUNTER — Ambulatory Visit (INDEPENDENT_AMBULATORY_CARE_PROVIDER_SITE_OTHER): Payer: BC Managed Care – PPO | Admitting: Cardiovascular Disease

## 2012-10-18 ENCOUNTER — Other Ambulatory Visit: Payer: Self-pay

## 2012-10-18 ENCOUNTER — Encounter: Payer: Self-pay | Admitting: Cardiovascular Disease

## 2012-10-18 VITALS — BP 134/72 | HR 52 | Ht 67.0 in | Wt 167.0 lb

## 2012-10-18 DIAGNOSIS — E785 Hyperlipidemia, unspecified: Secondary | ICD-10-CM

## 2012-10-18 DIAGNOSIS — I059 Rheumatic mitral valve disease, unspecified: Secondary | ICD-10-CM

## 2012-10-18 DIAGNOSIS — I4891 Unspecified atrial fibrillation: Secondary | ICD-10-CM

## 2012-10-18 DIAGNOSIS — I341 Nonrheumatic mitral (valve) prolapse: Secondary | ICD-10-CM

## 2012-10-18 MED ORDER — METOPROLOL TARTRATE 25 MG PO TABS: 25.0000 mg | ORAL_TABLET | Freq: Two times a day (BID) | ORAL | Status: DC

## 2012-10-18 NOTE — Progress Notes (Signed)
Jocelyn Moss Date of Birth  Oct 21, 1935  Jocelyn Moss Office [address], [place], [place]  [address]  [employer] [address], [place], [place]  Problem List 1. Mitral Valve Prolapse 2. Hyperlipidemia- intolerant to Crestor, Lipator, Zocor 3. History of colon cancer-status post ileostomy - 2000 4. Hypothyroidism 5. Breast cancer - 1990  History of Present Illness:  Jocelyn Moss is an elderly female with a history of mitral valve prolapse, hypothyroidism, and hypercholesterolemia. She has done very well since I last saw her. She is not having any episodes of chest pain or shortness of breath.  Sept. 24, 2014:  Jocelyn Moss is doing well.   No CP or palpitations.   She has not tolerated statins - refuses to try any more of them.     Current Outpatient Prescriptions on File Prior to Visit  Medication Sig Dispense Refill  . budesonide-formoterol (SYMBICORT) 160-4.5 MCG/ACT inhaler Inhale 2 puffs into the lungs 2 (two) times daily.  1 Inhaler  5  . Calcium Carbonate-Vitamin D (CALCIUM + D PO) Take by mouth 2 (two) times daily.        Marland Kitchen esomeprazole (NEXIUM) 20 MG capsule Take 20 mg by mouth daily before breakfast.      . levothyroxine (SYNTHROID, LEVOTHROID) 100 MCG tablet Take 1 tablet (100 mcg total) by mouth daily.  90 tablet  0  . metoprolol tartrate (LOPRESSOR) 25 MG tablet Take 1 tablet (25 mg total) by mouth 2 (two) times daily.  180 tablet  3  . omeprazole (PRILOSEC) 40 MG capsule Take 1 capsule (40 mg total) by mouth daily.  30 capsule  11   No current Moss-administered medications on file prior to visit.    Allergies  Allergen Reactions  . Crestor [Rosuvastatin Calcium]   . Rosuvastatin   . Zetia [Ezetimibe]     Past Medical History  Diagnosis Date  . MVP (mitral valve prolapse)   . Hypercholesterolemia   . History of colon cancer   . Hypothyroidism   . Chest pain   . Atrial fibrillation   . SOB  (shortness of breath)   . Breast cancer 1990  . Colon cancer     ileostomy 2000  . Osteoporosis     osteopenia    Past Surgical History  Procedure Laterality Date  . Ileostomy    . Breast lumpectomy    . Colon surgery  2000    ileostomy    History  Smoking status  . Never Smoker   Smokeless tobacco  . Never Used    History  Alcohol Use No    Family History  Problem Relation Age of Onset  . Lung cancer Mother   . Lung cancer Father   . Lung cancer Sister   . Breast cancer Sister     Reviw of Systems:  Reviewed in the HPI.  All other systems are negative.  Physical Exam: BP 134/72  Pulse 52  Ht 5\' 7"  (1.702 m)  Wt 167 lb (75.751 kg)  BMI 26.15 kg/m2 The patient is alert and oriented x 3.  The mood and affect are normal.  The skin is warm and dry.  Color is normal.  The HEENT exam reveals that the sclera are nonicteric.  The mucous membranes are moist.  The carotids are 2+ without bruits.  There is no thyromegaly.  There is no JVD.  The lungs are clear.  The chest wall is non tender.  The heart exam reveals a regular rate with a normal S1 and S2. She has a soft mid systolic click.  The PMI is not displaced.   Abdominal exam reveals good bowel sounds.  There is no guarding or rebound.  There is no hepatosplenomegaly or tenderness.  There are no masses.  Exam of the legs reveal no clubbing, cyanosis, or edema.  The legs are without rashes.  The distal pulses are intact.  Cranial nerves II - XII are intact.  Motor and sensory functions are intact.  The gait is normal.  ECG: 05/27/2011-sinus bradycardia at 54 beats a minute. EKG is otherwise normal   Assessment / Plan:

## 2012-10-18 NOTE — Assessment & Plan Note (Signed)
Jocelyn Moss is doing well. She's not having episodes of tachycardia. She's tolerating metoprolol very well. Her mitral valve  prolapse is stable.

## 2012-10-18 NOTE — Patient Instructions (Addendum)
Your physician wants you to follow-up in: 1 YEAR.  You will receive a reminder letter in the mail two months in advance. If you don't receive a letter, please call our office to schedule the follow-up appointment.  Your physician recommends that you continue on your current medications as directed. Please refer to the Current Medication list given to you today.  

## 2012-10-18 NOTE — Assessment & Plan Note (Signed)
She's not tolerated several statins. She refuses to try anymore. Her medical Dr. will continue to monitor this.

## 2012-11-08 ENCOUNTER — Other Ambulatory Visit: Payer: Medicare Other

## 2012-11-08 DIAGNOSIS — E039 Hypothyroidism, unspecified: Secondary | ICD-10-CM

## 2012-11-10 ENCOUNTER — Telehealth: Payer: Self-pay | Admitting: Family Medicine

## 2012-11-10 DIAGNOSIS — E039 Hypothyroidism, unspecified: Secondary | ICD-10-CM

## 2012-11-10 MED ORDER — LEVOTHYROXINE SODIUM 100 MCG PO TABS: 100.0000 ug | ORAL_TABLET | Freq: Every day | ORAL | Status: DC

## 2012-11-10 NOTE — Telephone Encounter (Signed)
Message copied by Olena Mater on Fri Nov 10, 2012  5:57 PM ------      Message from: Dena Billet      Created: Wed Nov 08, 2012  5:16 PM       Current dose of thyroid should be 100 mcg. Verify that this is correct. Tell her to continue current dose but would go ahead and recheck again in 3 months. ------

## 2012-11-10 NOTE — Telephone Encounter (Signed)
Pt aware of lab results and provider recommendations 

## 2013-01-01 ENCOUNTER — Ambulatory Visit (INDEPENDENT_AMBULATORY_CARE_PROVIDER_SITE_OTHER): Payer: BC Managed Care – PPO | Admitting: Family Medicine

## 2013-01-01 DIAGNOSIS — Z23 Encounter for immunization: Secondary | ICD-10-CM

## 2013-01-02 ENCOUNTER — Other Ambulatory Visit: Payer: Self-pay

## 2013-01-02 ENCOUNTER — Other Ambulatory Visit: Payer: Self-pay | Admitting: Obstetrics and Gynecology

## 2013-01-02 DIAGNOSIS — Z1231 Encounter for screening mammogram for malignant neoplasm of breast: Secondary | ICD-10-CM

## 2013-01-02 DIAGNOSIS — M8580 Other specified disorders of bone density and structure, unspecified site: Secondary | ICD-10-CM

## 2013-01-02 DIAGNOSIS — Z853 Personal history of malignant neoplasm of breast: Secondary | ICD-10-CM

## 2013-01-02 DIAGNOSIS — Z9889 Other specified postprocedural states: Secondary | ICD-10-CM

## 2013-02-09 ENCOUNTER — Ambulatory Visit
Admission: RE | Admit: 2013-02-09 | Discharge: 2013-02-09 | Disposition: A | Discharge status: Home / Self Care | Payer: BC Managed Care – PPO | Source: Ambulatory Visit

## 2013-02-09 ENCOUNTER — Ambulatory Visit
Admission: RE | Admit: 2013-02-09 | Discharge: 2013-02-09 | Disposition: A | Discharge status: Home / Self Care | Payer: BC Managed Care – PPO | Source: Ambulatory Visit | Attending: Obstetrics and Gynecology | Admitting: Obstetrics and Gynecology

## 2013-02-09 DIAGNOSIS — Z1231 Encounter for screening mammogram for malignant neoplasm of breast: Secondary | ICD-10-CM

## 2013-02-09 DIAGNOSIS — Z9889 Other specified postprocedural states: Secondary | ICD-10-CM

## 2013-02-09 DIAGNOSIS — M8580 Other specified disorders of bone density and structure, unspecified site: Secondary | ICD-10-CM

## 2013-02-09 DIAGNOSIS — Z853 Personal history of malignant neoplasm of breast: Secondary | ICD-10-CM

## 2013-03-27 ENCOUNTER — Ambulatory Visit (INDEPENDENT_AMBULATORY_CARE_PROVIDER_SITE_OTHER): Payer: Medicare Other | Admitting: Family Medicine

## 2013-03-27 ENCOUNTER — Encounter: Payer: Self-pay | Admitting: Family Medicine

## 2013-03-27 VITALS — BP 124/80 | HR 68 | Temp 97.6°F | Resp 18 | Ht 66.0 in | Wt 172.0 lb

## 2013-03-27 DIAGNOSIS — E039 Hypothyroidism, unspecified: Secondary | ICD-10-CM

## 2013-03-27 MED ORDER — OMEPRAZOLE 20 MG PO CPDR: 20.0000 mg | DELAYED_RELEASE_CAPSULE | Freq: Two times a day (BID) | ORAL | Status: DC

## 2013-03-27 MED ORDER — BUDESONIDE-FORMOTEROL FUMARATE 160-4.5 MCG/ACT IN AERO: 2.0000 | INHALATION_SPRAY | Freq: Two times a day (BID) | RESPIRATORY_TRACT | Status: DC

## 2013-03-27 NOTE — Progress Notes (Signed)
   Subjective:    Patient ID: Jocelyn Moss, female    DOB: 14-Mar-1935, 78 y.o.   MRN: [id number]  HPI Patient continues to do well from a standpoint of her chronic cough. She's currently taking Symbicort twice a day. She is also taking omeprazole 40 mg once a day. Like to take omeprazole 20 mg twice a day due to her colostomy. She is concerned that she may not be absorbing the omeprazole well due to the colostomy.  She also has hypothyroidism. She is currently on levothyroxine 100 mcg by mouth daily. Her last TSH was checked in October and was supertherapeutic. Therefore she is due to recheck a TSH. Past Medical History  Diagnosis Date  . MVP (mitral valve prolapse)   . Hypercholesterolemia   . History of colon cancer   . Hypothyroidism   . Chest pain   . Atrial fibrillation   . SOB (shortness of breath)   . Breast cancer 1990  . Colon cancer     ileostomy 2000  . Osteoporosis     osteopenia  \ Current Outpatient Prescriptions on File Prior to Visit  Medication Sig Dispense Refill  . levothyroxine (SYNTHROID, LEVOTHROID) 100 MCG tablet Take 1 tablet (100 mcg total) by mouth daily.  90 tablet  0  . metoprolol tartrate (LOPRESSOR) 25 MG tablet Take 1 tablet (25 mg total) by mouth 2 (two) times daily.  180 tablet  3  . omeprazole (PRILOSEC) 40 MG capsule Take 1 capsule (40 mg total) by mouth daily.  30 capsule  11  . Calcium Carbonate-Vitamin D (CALCIUM + D PO) Take by mouth 2 (two) times daily.         No current facility-administered medications on file prior to visit.   Allergies  Allergen Reactions  . Crestor [Rosuvastatin Calcium]   . Rosuvastatin   . Zetia [Ezetimibe]    History   Social History  . Marital Status: Married    Spouse Name: N/A    Number of Children: N/A  . Years of Education: N/A   Occupational History  . Not on file.   Social History Main Topics  . Smoking status: Never Smoker   . Smokeless tobacco: Never Used  . Alcohol Use: No  . Drug Use: No   . Sexual Activity: Not on file   Other Topics Concern  . Not on file   Social History Narrative  . No narrative on file      Review of Systems  All other systems reviewed and are negative.       Objective:   Physical Exam  Vitals reviewed. Constitutional: She appears well-developed and well-nourished.  Neck: Neck supple. No thyromegaly present.  Cardiovascular: Normal rate, regular rhythm and normal heart sounds.   No murmur heard. Pulmonary/Chest: Effort normal and breath sounds normal. No respiratory distress. She has no wheezes. She has no rales.  Abdominal: Soft. Bowel sounds are normal. She exhibits no distension. There is no tenderness. There is no rebound.  Lymphadenopathy:    She has no cervical adenopathy.          Assessment & Plan:  1. Unspecified hypothyroidism Recheck patient's TSH. - TSH Patient's chronic cough is due to a combination of upper airway cough syndrome, laryngo-esophageal reflux, and likely some underlying asthma. Continue Symbicort and omeprazole. Her symptoms are well controlled at present.

## 2013-03-28 LAB — TSH: TSH: 6.867 u[IU]/mL — AB (ref 0.350–4.500)

## 2013-03-29 ENCOUNTER — Other Ambulatory Visit: Payer: Self-pay | Admitting: *Deleted

## 2013-03-29 DIAGNOSIS — E039 Hypothyroidism, unspecified: Secondary | ICD-10-CM

## 2013-03-29 MED ORDER — LEVOTHYROXINE SODIUM 112 MCG PO TABS: 112.0000 ug | ORAL_TABLET | ORAL | Status: DC

## 2013-03-29 MED ORDER — LEVOTHYROXINE SODIUM 100 MCG PO TABS: 100.0000 ug | ORAL_TABLET | ORAL | Status: DC

## 2013-03-29 NOTE — Telephone Encounter (Signed)
Per lab results, Synthroid 110mcg QOD and Synthroid 145mcg QOD sent to pharmacy. Pt aware.

## 2013-05-14 ENCOUNTER — Other Ambulatory Visit: Payer: Medicare Other

## 2013-05-14 DIAGNOSIS — E039 Hypothyroidism, unspecified: Secondary | ICD-10-CM

## 2013-05-14 LAB — TSH: TSH: 2.698 u[IU]/mL (ref 0.350–4.500)

## 2013-05-15 ENCOUNTER — Telehealth: Payer: Self-pay | Admitting: Family Medicine

## 2013-05-15 DIAGNOSIS — E039 Hypothyroidism, unspecified: Secondary | ICD-10-CM

## 2013-05-15 MED ORDER — LEVOTHYROXINE SODIUM 100 MCG PO TABS: 100.0000 ug | ORAL_TABLET | ORAL | Status: DC

## 2013-05-15 MED ORDER — LEVOTHYROXINE SODIUM 112 MCG PO TABS
112.0000 ug | ORAL_TABLET | ORAL | Status: DC
Start: 2013-05-15 — End: 2013-11-23

## 2013-05-15 NOTE — Telephone Encounter (Signed)
Pt aware of normal thyroid level.  Continue medications at current dose.  6 month refills sent to pharmacy

## 2013-05-15 NOTE — Telephone Encounter (Signed)
Message copied by Olena Mater on Tue May 15, 2013 12:11 PM ------      Message from: Dena Billet      Created: Tue May 15, 2013  7:34 AM       Phebe Colla!! TSH is right in the middle of the normal range.  Continue current dose of thyroid. Recheck labs in 6 months. See if she needs [place] to send in refills to last 6 months. ------

## 2013-10-23 ENCOUNTER — Ambulatory Visit (INDEPENDENT_AMBULATORY_CARE_PROVIDER_SITE_OTHER): Payer: Medicare Other | Admitting: *Deleted

## 2013-10-23 DIAGNOSIS — Z23 Encounter for immunization: Secondary | ICD-10-CM

## 2013-11-14 ENCOUNTER — Other Ambulatory Visit: Payer: Self-pay | Admitting: Family Medicine

## 2013-11-15 ENCOUNTER — Ambulatory Visit (INDEPENDENT_AMBULATORY_CARE_PROVIDER_SITE_OTHER): Payer: Medicare Other | Admitting: Family Medicine

## 2013-11-15 ENCOUNTER — Encounter: Payer: Self-pay | Admitting: Family Medicine

## 2013-11-15 ENCOUNTER — Ambulatory Visit
Admission: RE | Admit: 2013-11-15 | Discharge: 2013-11-15 | Disposition: A | Discharge status: Home / Self Care | Payer: BC Managed Care – PPO | Source: Ambulatory Visit | Attending: Family Medicine | Admitting: Family Medicine

## 2013-11-15 VITALS — BP 120/74 | HR 58 | Temp 97.4°F | Resp 14 | Ht 66.0 in | Wt 163.0 lb

## 2013-11-15 DIAGNOSIS — J411 Mucopurulent chronic bronchitis: Secondary | ICD-10-CM

## 2013-11-15 DIAGNOSIS — E038 Other specified hypothyroidism: Secondary | ICD-10-CM

## 2013-11-15 LAB — TSH: TSH: 1.115 u[IU]/mL (ref 0.350–4.500)

## 2013-11-15 MED ORDER — OMEPRAZOLE 40 MG PO CPDR: DELAYED_RELEASE_CAPSULE | ORAL | Status: DC

## 2013-11-15 NOTE — Progress Notes (Signed)
Subjective:    Patient ID: Jocelyn Moss, female    DOB: Jun 24, 1935, 78 y.o.   MRN: [id number]  HPI Patient has a chronic cough. In the past we have treated her with a combination of medication for allergy, acid reflux, and chronic bronchitis/asthma. She has a strong family history of asthma in her mother and her sister. When the patient takes omeprazole as well as Symbicort, her chronic cough is much better. However she would periodically discontinue the Symbicort and shortly thereafter her chronic cough will return. At the present time she reports a cough productive of green sputum. She denies any fevers or chills. She denies any chest pain. She denies any shortness of breath. She also has a history of hypothyroidism and is due to recheck a TSH today. Her immunizations are utd. Past Medical History  Diagnosis Date  . MVP (mitral valve prolapse)   . Hypercholesterolemia   . History of colon cancer   . Hypothyroidism   . Chest pain   . Atrial fibrillation   . SOB (shortness of breath)   . Breast cancer 1990  . Colon cancer     ileostomy 2000  . Osteoporosis     osteopenia   Past Surgical History  Procedure Laterality Date  . Ileostomy    . Breast lumpectomy    . Colon surgery  2000    ileostomy   Current Outpatient Prescriptions on File Prior to Visit  Medication Sig Dispense Refill  . budesonide-formoterol (SYMBICORT) 160-4.5 MCG/ACT inhaler Inhale 2 puffs into the lungs 2 (two) times daily.  1 Inhaler  5  . Calcium Carbonate-Vitamin D (CALCIUM + D PO) Take by mouth 2 (two) times daily.        Marland Kitchen levothyroxine (SYNTHROID, LEVOTHROID) 100 MCG tablet Take 1 tablet (100 mcg total) by mouth every other day.  45 tablet  1  . levothyroxine (SYNTHROID, LEVOTHROID) 112 MCG tablet Take 1 tablet (112 mcg total) by mouth every other day.  45 tablet  1  . metoprolol tartrate (LOPRESSOR) 25 MG tablet Take 1 tablet (25 mg total) by mouth 2 (two) times daily.  180 tablet  3   No current  facility-administered medications on file prior to visit.   Allergies  Allergen Reactions  . Crestor [Rosuvastatin Calcium]   . Rosuvastatin   . Zetia [Ezetimibe]    History   Social History  . Marital Status: Married    Spouse Name: N/A    Number of Children: N/A  . Years of Education: N/A   Occupational History  . Not on file.   Social History Main Topics  . Smoking status: Never Smoker   . Smokeless tobacco: Never Used  . Alcohol Use: No  . Drug Use: No  . Sexual Activity: Not on file   Other Topics Concern  . Not on file   Social History Narrative  . No narrative on file      Review of Systems  All other systems reviewed and are negative.      Objective:   Physical Exam  Vitals reviewed. Constitutional: She appears well-developed and well-nourished.  HENT:  Right Ear: External ear normal.  Left Ear: External ear normal.  Nose: Nose normal.  Mouth/Throat: Oropharynx is clear and moist.  Eyes: Conjunctivae are normal. Pupils are equal, round, and reactive to light. No scleral icterus.  Neck: Neck supple. No thyromegaly present.  Cardiovascular: Normal rate, regular rhythm and normal heart sounds.   Pulmonary/Chest: Effort normal. No respiratory  distress. She has no wheezes. She has rales.  Abdominal: Soft. Bowel sounds are normal.  Lymphadenopathy:    She has no cervical adenopathy.   patient has faint left basilar rales.        Assessment & Plan:  Other specified hypothyroidism - Plan: TSH  Mucopurulent chronic bronchitis - Plan: DG Chest 2 View   I will check the patient's TSH to monitor her hypothyroidism. I explained to the patient I believe her chronic cough is due to a combination of chronic bronchitis as well as acid reflux disease. She needs to remain on her proton pump inhibitor. Also recommended that she continue Symbicort long-term. I will check a chest x-ray given the abnormalities found on her lung exam. If there is an evidence of  infection I will treat the patient with a fluoroquinolone for possible exacerbation of her chronic bronchitis.

## 2013-11-21 ENCOUNTER — Encounter: Payer: Self-pay | Admitting: Family Medicine

## 2013-11-23 ENCOUNTER — Telehealth: Payer: Self-pay | Admitting: Family Medicine

## 2013-11-23 DIAGNOSIS — E038 Other specified hypothyroidism: Secondary | ICD-10-CM

## 2013-11-23 MED ORDER — LEVOTHYROXINE SODIUM 100 MCG PO TABS: 100.0000 ug | ORAL_TABLET | ORAL | Status: DC

## 2013-11-23 MED ORDER — LEVOTHYROXINE SODIUM 112 MCG PO TABS: 112.0000 ug | ORAL_TABLET | ORAL | Status: DC

## 2013-11-23 NOTE — Telephone Encounter (Signed)
[phone] Patient is calling to check on her test results that she had done one week ago

## 2013-11-23 NOTE — Telephone Encounter (Signed)
Patient aware of results med sent to [facility]

## 2013-12-10 ENCOUNTER — Telehealth: Payer: Self-pay | Admitting: Family Medicine

## 2013-12-10 MED ORDER — METOPROLOL TARTRATE 25 MG PO TABS: 25.0000 mg | ORAL_TABLET | Freq: Two times a day (BID) | ORAL | Status: DC

## 2013-12-10 NOTE — Telephone Encounter (Signed)
[phone]   Pt is needing a refill on metoprolol tartrate (LOPRESSOR) 25 MG tablet   [facility] [place], [place] - [address]

## 2013-12-10 NOTE — Telephone Encounter (Signed)
Med sent for 90 day supply

## 2013-12-24 ENCOUNTER — Ambulatory Visit (INDEPENDENT_AMBULATORY_CARE_PROVIDER_SITE_OTHER): Payer: Medicare Other | Admitting: Family Medicine

## 2013-12-24 ENCOUNTER — Encounter: Payer: Self-pay | Admitting: Family Medicine

## 2013-12-24 ENCOUNTER — Telehealth: Payer: Self-pay | Admitting: Family Medicine

## 2013-12-24 ENCOUNTER — Encounter (INDEPENDENT_AMBULATORY_CARE_PROVIDER_SITE_OTHER): Payer: Self-pay

## 2013-12-24 VITALS — BP 130/74 | HR 60 | Temp 97.4°F | Resp 16 | Ht 66.0 in | Wt 163.0 lb

## 2013-12-24 DIAGNOSIS — N3090 Cystitis, unspecified without hematuria: Secondary | ICD-10-CM

## 2013-12-24 DIAGNOSIS — R350 Frequency of micturition: Secondary | ICD-10-CM

## 2013-12-24 LAB — URINALYSIS, ROUTINE W REFLEX MICROSCOPIC
Bilirubin Urine: NEGATIVE
Glucose, UA: 100 mg/dL — AB
KETONES UR: NEGATIVE mg/dL
NITRITE: POSITIVE — AB
PH: 5.5 (ref 5.0–8.0)
SPECIFIC GRAVITY, URINE: 1.015 (ref 1.005–1.030)
Urobilinogen, UA: 1 mg/dL (ref 0.0–1.0)

## 2013-12-24 LAB — URINALYSIS, MICROSCOPIC ONLY
CASTS: NONE SEEN
Crystals: NONE SEEN

## 2013-12-24 MED ORDER — CIPROFLOXACIN HCL 500 MG PO TABS: 500.0000 mg | ORAL_TABLET | Freq: Two times a day (BID) | ORAL | Status: DC

## 2013-12-24 NOTE — Progress Notes (Signed)
   Subjective:    Patient ID: Jocelyn Moss, female    DOB: 06-Dec-1935, 78 y.o.   MRN: [id number]  HPI Patient reports 4 days of urgency, frequency, hesitancy, and mild dysuria.  She has been taking azo for 4 days.  She denies fevers, chills, or LBP.  She denies hematuria. Past Medical History  Diagnosis Date  . MVP (mitral valve prolapse)   . Hypercholesterolemia   . History of colon cancer   . Hypothyroidism   . Chest pain   . Atrial fibrillation   . SOB (shortness of breath)   . Breast cancer 1990  . Colon cancer     ileostomy 2000  . Osteoporosis     osteopenia   Past Surgical History  Procedure Laterality Date  . Ileostomy    . Breast lumpectomy    . Colon surgery  2000    ileostomy   Current Outpatient Prescriptions on File Prior to Visit  Medication Sig Dispense Refill  . budesonide-formoterol (SYMBICORT) 160-4.5 MCG/ACT inhaler Inhale 2 puffs into the lungs 2 (two) times daily. 1 Inhaler 5  . Calcium Carbonate-Vitamin D (CALCIUM + D PO) Take by mouth 2 (two) times daily.      Marland Kitchen levothyroxine (SYNTHROID, LEVOTHROID) 100 MCG tablet Take 1 tablet (100 mcg total) by mouth every other day. 45 tablet 11  . levothyroxine (SYNTHROID, LEVOTHROID) 112 MCG tablet Take 1 tablet (112 mcg total) by mouth every other day. 45 tablet 11  . metoprolol tartrate (LOPRESSOR) 25 MG tablet Take 1 tablet (25 mg total) by mouth 2 (two) times daily. 180 tablet 3  . omeprazole (PRILOSEC) 40 MG capsule TAKE ONE CAPSULE BY MOUTH ONCE DAILY 30 capsule 11   No current facility-administered medications on file prior to visit.   Allergies  Allergen Reactions  . Crestor [Rosuvastatin Calcium]   . Rosuvastatin   . Zetia [Ezetimibe]    History   Social History  . Marital Status: Married    Spouse Name: N/A    Number of Children: N/A  . Years of Education: N/A   Occupational History  . Not on file.   Social History Main Topics  . Smoking status: Never Smoker   . Smokeless tobacco:  Never Used  . Alcohol Use: No  . Drug Use: No  . Sexual Activity: Not on file   Other Topics Concern  . Not on file   Social History Narrative      Review of Systems  All other systems reviewed and are negative.      Objective:   Physical Exam  Constitutional: She appears well-developed and well-nourished.  Cardiovascular: Normal rate and regular rhythm.   Pulmonary/Chest: Effort normal and breath sounds normal.  Abdominal: Soft. Bowel sounds are normal. She exhibits no distension. There is no tenderness. There is no rebound.  Vitals reviewed.         Assessment & Plan:  Frequent urination - Plan: Urinalysis, Routine w reflex microscopic  Cystitis - Plan: ciprofloxacin (CIPRO) 500 MG tablet  Begin cipro 500 mg pobid for 5 days.

## 2014-04-08 ENCOUNTER — Other Ambulatory Visit: Payer: Self-pay

## 2014-04-08 ENCOUNTER — Telehealth: Payer: Self-pay | Admitting: Family Medicine

## 2014-04-08 DIAGNOSIS — Z1231 Encounter for screening mammogram for malignant neoplasm of breast: Secondary | ICD-10-CM

## 2014-04-08 NOTE — Telephone Encounter (Signed)
Patient calling to say that symbicort is not going to be covered  By insurance anymore, would like to know if there is anything dr pickard could substitute for this that insurance would cover  [phone]

## 2014-04-09 NOTE — Telephone Encounter (Signed)
LMTRC

## 2014-04-11 NOTE — Telephone Encounter (Signed)
Insurance is [employer], operational -

## 2014-04-11 NOTE — Telephone Encounter (Signed)
Need pts insurance information - not in computer - pt may call insurance and see what other med is covered

## 2014-04-17 ENCOUNTER — Ambulatory Visit
Admission: RE | Admit: 2014-04-17 | Discharge: 2014-04-17 | Disposition: A | Discharge status: Home / Self Care | Payer: Medicare Other | Source: Ambulatory Visit

## 2014-04-17 DIAGNOSIS — Z1231 Encounter for screening mammogram for malignant neoplasm of breast: Secondary | ICD-10-CM

## 2014-04-23 MED ORDER — MOMETASONE FURO-FORMOTEROL FUM 100-5 MCG/ACT IN AERO: 2.0000 | INHALATION_SPRAY | Freq: Two times a day (BID) | RESPIRATORY_TRACT | Status: DC

## 2014-04-23 NOTE — Telephone Encounter (Signed)
Jocelyn Moss is covered by her insurance - pt aware and will call insurance company to check on mail order as it will have to be through mail order and pt will call back.

## 2014-04-23 NOTE — Telephone Encounter (Signed)
Pt called and verified with insurance and symbicort changed to dulera and sent to pharm

## 2014-07-26 ENCOUNTER — Telehealth: Payer: Self-pay | Admitting: Family Medicine

## 2014-07-26 NOTE — Telephone Encounter (Signed)
Agree with topical cortisone, if this spreads come in for visit

## 2014-07-26 NOTE — Telephone Encounter (Signed)
Calling about small  Spot in between breasts.  Not painful just itchy.  Now three smaller bumps near by.  Has had Zoztavax  Not sure what this is.  Does not recall any bug bite or exposure to poison.  Areas are not painful of tender and are not blistered.  Just very itchy.  She feels just fine.  Has been using antibiotic ointment on it.  Told her to keep eye on it.  Try some OTC hydrocortisone cream (lightly)  If not better or worse by next week, call for appointment.

## 2014-11-12 ENCOUNTER — Ambulatory Visit (INDEPENDENT_AMBULATORY_CARE_PROVIDER_SITE_OTHER): Payer: Medicare Other | Admitting: Family Medicine

## 2014-11-12 DIAGNOSIS — Z23 Encounter for immunization: Secondary | ICD-10-CM

## 2014-11-19 ENCOUNTER — Encounter: Payer: Self-pay | Admitting: Family Medicine

## 2014-11-19 ENCOUNTER — Ambulatory Visit (INDEPENDENT_AMBULATORY_CARE_PROVIDER_SITE_OTHER): Payer: Medicare Other | Admitting: Family Medicine

## 2014-11-19 VITALS — BP 126/74 | HR 68 | Temp 98.0°F | Resp 16 | Ht 66.0 in | Wt 161.0 lb

## 2014-11-19 DIAGNOSIS — J45991 Cough variant asthma: Secondary | ICD-10-CM

## 2014-11-19 DIAGNOSIS — E038 Other specified hypothyroidism: Secondary | ICD-10-CM

## 2014-11-19 DIAGNOSIS — J411 Mucopurulent chronic bronchitis: Secondary | ICD-10-CM | POA: Diagnosis not present

## 2014-11-19 LAB — COMPLETE METABOLIC PANEL WITH GFR
ALT: 10 U/L (ref 6–29)
AST: 14 U/L (ref 10–35)
Albumin: 3.7 g/dL (ref 3.6–5.1)
Alkaline Phosphatase: 87 U/L (ref 33–130)
BILIRUBIN TOTAL: 0.6 mg/dL (ref 0.2–1.2)
BUN: 22 mg/dL (ref 7–25)
CO2: 25 mmol/L (ref 20–31)
Calcium: 9.3 mg/dL (ref 8.6–10.4)
Chloride: 101 mmol/L (ref 98–110)
Creat: 1.06 mg/dL — ABNORMAL HIGH (ref 0.60–0.93)
GFR, EST AFRICAN AMERICAN: 59 mL/min — AB (ref >=60)
GFR, EST NON AFRICAN AMERICAN: 51 mL/min — AB (ref >=60)
Glucose, Bld: 94 mg/dL (ref 70–99)
POTASSIUM: 3.8 mmol/L (ref 3.5–5.3)
SODIUM: 136 mmol/L (ref 135–146)
TOTAL PROTEIN: 7.1 g/dL (ref 6.1–8.1)

## 2014-11-19 LAB — CBC WITH DIFFERENTIAL/PLATELET
Basophils Absolute: 0 10*3/uL (ref 0.0–0.1)
Basophils Relative: 0 % (ref 0–1)
EOS ABS: 0.2 10*3/uL (ref 0.0–0.7)
Eosinophils Relative: 3 % (ref 0–5)
HCT: 36.6 % (ref 36.0–46.0)
Hemoglobin: 12.1 g/dL (ref 12.0–15.0)
LYMPHS ABS: 1.3 10*3/uL (ref 0.7–4.0)
Lymphocytes Relative: 25 % (ref 12–46)
MCH: 30.3 pg (ref 26.0–34.0)
MCHC: 33.1 g/dL (ref 30.0–36.0)
MCV: 91.5 fL (ref 78.0–100.0)
MONO ABS: 0.4 10*3/uL (ref 0.1–1.0)
MONOS PCT: 8 % (ref 3–12)
MPV: 9.4 fL (ref 8.6–12.4)
NEUTROS PCT: 64 % (ref 43–77)
Neutro Abs: 3.3 10*3/uL (ref 1.7–7.7)
Platelets: 201 10*3/uL (ref 150–400)
RBC: 4 MIL/uL (ref 3.87–5.11)
RDW: 13.6 % (ref 11.5–15.5)
WBC: 5.1 10*3/uL (ref 4.0–10.5)

## 2014-11-19 LAB — TSH: TSH: 2.429 u[IU]/mL (ref 0.350–4.500)

## 2014-11-19 MED ORDER — OMEPRAZOLE 40 MG PO CPDR: DELAYED_RELEASE_CAPSULE | ORAL | Status: DC

## 2014-11-19 NOTE — Progress Notes (Signed)
Subjective:    Patient ID: Jocelyn Moss, female    DOB: 10-01-1935, 79 y.o.   MRN: [id number]  HPI 11/15/13 Patient has a chronic cough. In the past we have treated her with a combination of medication for allergy, acid reflux, and chronic bronchitis/asthma. She has a strong family history of asthma in her mother and her sister. When the patient takes omeprazole as well as Symbicort, her chronic cough is much better. However she would periodically discontinue the Symbicort and shortly thereafter her chronic cough will return. At the present time she reports a cough productive of green sputum. She denies any fevers or chills. She denies any chest pain. She denies any shortness of breath. She also has a history of hypothyroidism and is due to recheck a TSH today. Her immunizations are utd.  At that time, my plan was: I will check the patient's TSH to monitor her hypothyroidism. I explained to the patient I believe her chronic cough is due to a combination of chronic bronchitis as well as acid reflux disease. She needs to remain on her proton pump inhibitor. Also recommended that she continue Symbicort long-term. I will check a chest x-ray given the abnormalities found on her lung exam. If there is an evidence of infection I will treat the patient with a fluoroquinolone for possible exacerbation of her chronic bronchitis.  11/19/14 Here today for follow up.  The patient has been doing very good. She is not taking omeprazole regularly. She is compliant with dulera 100/5 2 puff inh bid.  She was ultimately switched to this by her insurance would not cover Symbicort. This seems to be helping. She states that her cough is mainly worse at the end of summer and early fall. It seems to be an allergic trigger due to Farmer's cutting the fields of hay and soybeans next to her house plus the falling leaves and dust.  She is also due today to recheck her thyroid Past Medical History  Diagnosis Date  . MVP  (mitral valve prolapse)   . Hypercholesterolemia   . History of colon cancer   . Hypothyroidism   . Chest pain   . Atrial fibrillation   . SOB (shortness of breath)   . Breast cancer 1990  . Colon cancer     ileostomy 2000  . Osteoporosis     osteopenia   Past Surgical History  Procedure Laterality Date  . Ileostomy    . Breast lumpectomy    . Colon surgery  2000    ileostomy   Current Outpatient Prescriptions on File Prior to Visit  Medication Sig Dispense Refill  . Calcium Carbonate-Vitamin D (CALCIUM + D PO) Take by mouth 2 (two) times daily.      Marland Kitchen levothyroxine (SYNTHROID, LEVOTHROID) 100 MCG tablet Take 1 tablet (100 mcg total) by mouth every other day. 45 tablet 11  . levothyroxine (SYNTHROID, LEVOTHROID) 112 MCG tablet Take 1 tablet (112 mcg total) by mouth every other day. 45 tablet 11  . metoprolol tartrate (LOPRESSOR) 25 MG tablet Take 1 tablet (25 mg total) by mouth 2 (two) times daily. 180 tablet 3  . mometasone-formoterol (DULERA) 100-5 MCG/ACT AERO Inhale 2 puffs into the lungs 2 (two) times daily. 39 g 3  . omeprazole (PRILOSEC) 40 MG capsule TAKE ONE CAPSULE BY MOUTH ONCE DAILY 30 capsule 11   No current facility-administered medications on file prior to visit.   Allergies  Allergen Reactions  . Crestor [Rosuvastatin Calcium]   .  Rosuvastatin   . Zetia [Ezetimibe]    Social History   Social History  . Marital Status: Married    Spouse Name: N/A  . Number of Children: N/A  . Years of Education: N/A   Occupational History  . Not on file.   Social History Main Topics  . Smoking status: Never Smoker   . Smokeless tobacco: Never Used  . Alcohol Use: No  . Drug Use: No  . Sexual Activity: Not on file   Other Topics Concern  . Not on file   Social History Narrative      Review of Systems  All other systems reviewed and are negative.      Objective:   Physical Exam  Constitutional: She appears well-developed and well-nourished.  HENT:    Right Ear: External ear normal.  Left Ear: External ear normal.  Nose: Nose normal.  Mouth/Throat: Oropharynx is clear and moist.  Eyes: Conjunctivae are normal. Pupils are equal, round, and reactive to light. No scleral icterus.  Neck: Neck supple. No thyromegaly present.  Cardiovascular: Normal rate, regular rhythm and normal heart sounds.   Pulmonary/Chest: Effort normal. No respiratory distress. She has wheezes. She has no rales.  Abdominal: Soft. Bowel sounds are normal.  Lymphadenopathy:    She has no cervical adenopathy.  Vitals reviewed.         Assessment & Plan:  Asthma, cough variant  Mucopurulent chronic bronchitis (HCC)  Other specified hypothyroidism - Plan: CBC with Differential/Platelet, COMPLETE METABOLIC PANEL WITH GFR, TSH  Patient does sound more congested today and she is also wheezing. I recommended increasing dulera 200/52 puffs inhaled twice a day. Also recommended that she continue with her allergy medication including Claritin and Flonase. I believe this will help control her breathing. Immunizations and cancer screening are up-to-date. I will check her TSH.

## 2014-11-21 ENCOUNTER — Other Ambulatory Visit: Payer: Self-pay | Admitting: Family Medicine

## 2014-11-21 DIAGNOSIS — E038 Other specified hypothyroidism: Secondary | ICD-10-CM

## 2014-11-21 MED ORDER — LEVOTHYROXINE SODIUM 112 MCG PO TABS: 112.0000 ug | ORAL_TABLET | ORAL | Status: DC

## 2014-11-21 MED ORDER — LEVOTHYROXINE SODIUM 100 MCG PO TABS: 100.0000 ug | ORAL_TABLET | ORAL | Status: DC

## 2014-11-21 MED ORDER — METOPROLOL TARTRATE 25 MG PO TABS: 25.0000 mg | ORAL_TABLET | Freq: Two times a day (BID) | ORAL | Status: DC

## 2014-12-18 DIAGNOSIS — Z853 Personal history of malignant neoplasm of breast: Secondary | ICD-10-CM | POA: Insufficient documentation

## 2014-12-18 DIAGNOSIS — C189 Malignant neoplasm of colon, unspecified: Secondary | ICD-10-CM | POA: Insufficient documentation

## 2015-02-10 ENCOUNTER — Encounter: Payer: Self-pay | Admitting: Family Medicine

## 2015-02-10 ENCOUNTER — Ambulatory Visit (INDEPENDENT_AMBULATORY_CARE_PROVIDER_SITE_OTHER): Payer: Medicare Other | Admitting: Family Medicine

## 2015-02-10 VITALS — BP 156/80 | HR 62 | Temp 97.8°F | Resp 16 | Ht 66.0 in | Wt 163.0 lb

## 2015-02-10 DIAGNOSIS — D6489 Other specified anemias: Secondary | ICD-10-CM

## 2015-02-10 LAB — COMPLETE METABOLIC PANEL WITH GFR
ALT: 17 U/L (ref 6–29)
AST: 26 U/L (ref 10–35)
Albumin: 4.2 g/dL (ref 3.6–5.1)
Alkaline Phosphatase: 103 U/L (ref 33–130)
BUN: 25 mg/dL (ref 7–25)
CALCIUM: 9.6 mg/dL (ref 8.6–10.4)
CHLORIDE: 102 mmol/L (ref 98–110)
CO2: 25 mmol/L (ref 20–31)
CREATININE: 1.09 mg/dL — AB (ref 0.60–0.93)
GFR, EST AFRICAN AMERICAN: 57 mL/min — AB (ref >=60)
GFR, Est Non African American: 49 mL/min — ABNORMAL LOW (ref >=60)
Glucose, Bld: 96 mg/dL (ref 70–99)
Potassium: 4.9 mmol/L (ref 3.5–5.3)
Sodium: 134 mmol/L — ABNORMAL LOW (ref 135–146)
TOTAL PROTEIN: 7.4 g/dL (ref 6.1–8.1)
Total Bilirubin: 0.7 mg/dL (ref 0.2–1.2)

## 2015-02-10 LAB — CBC WITH DIFFERENTIAL/PLATELET
BASOS ABS: 0.1 10*3/uL (ref 0.0–0.1)
Basophils Relative: 1 % (ref 0–1)
Eosinophils Absolute: 0.1 10*3/uL (ref 0.0–0.7)
Eosinophils Relative: 2 % (ref 0–5)
HEMATOCRIT: 38.6 % (ref 36.0–46.0)
HEMOGLOBIN: 13.3 g/dL (ref 12.0–15.0)
LYMPHS ABS: 1.4 10*3/uL (ref 0.7–4.0)
LYMPHS PCT: 23 % (ref 12–46)
MCH: 31.4 pg (ref 26.0–34.0)
MCHC: 34.5 g/dL (ref 30.0–36.0)
MCV: 91.3 fL (ref 78.0–100.0)
MPV: 9.6 fL (ref 8.6–12.4)
Monocytes Absolute: 0.4 10*3/uL (ref 0.1–1.0)
Monocytes Relative: 7 % (ref 3–12)
NEUTROS ABS: 4.1 10*3/uL (ref 1.7–7.7)
NEUTROS PCT: 67 % (ref 43–77)
Platelets: 224 10*3/uL (ref 150–400)
RBC: 4.23 MIL/uL (ref 3.87–5.11)
RDW: 14 % (ref 11.5–15.5)
WBC: 6.1 10*3/uL (ref 4.0–10.5)

## 2015-02-10 LAB — ANEMIA PANEL
%SAT: 22 % (ref 11–50)
ABS RETIC: 50.8 10*3/uL (ref 19.0–186.0)
Ferritin: 132 ng/mL (ref 10–291)
Folate: >20 ng/mL
IRON: 87 ug/dL (ref 45–160)
RBC.: 4.23 MIL/uL (ref 3.87–5.11)
Retic Ct Pct: 1.2 % (ref 0.4–2.3)
TIBC: 389 ug/dL (ref 250–450)
UIBC: 302 ug/dL (ref 125–400)
VITAMIN B 12: 683 pg/mL (ref 211–911)

## 2015-02-10 LAB — TSH: TSH: 3.672 u[IU]/mL (ref 0.350–4.500)

## 2015-02-10 NOTE — Progress Notes (Signed)
Subjective:    Patient ID: Jocelyn Moss, female    DOB: [birth year], 80 y.o.   MRN: [id number]  HPI Patient's family made this appointment. They're concerned that she has not been acting like herself recently. Patient denies depression. She denies anhedonia. She denies insomnia. She denies any chest pain shortness of breath or dyspnea on exertion. She has not lost weight. She denies any blood in her stool. She denies any hematemesis. She denies any fevers or chills. She denies any memory loss. I performed a Mini-Mental status exam today along with a mini cog. She did extremely well although she did have a difficult time remembering one of the 3 objects on recall. However she was put able to perform serial sevens without difficulty. Her clock drawing was very precise and accurate. She was recently told that she was anemic at her gynecologist and she is here for follow-up. Past Medical History  Diagnosis Date  . MVP (mitral valve prolapse)   . Hypercholesterolemia   . History of colon cancer   . Hypothyroidism   . Chest pain   . Atrial fibrillation ([place])   . SOB (shortness of breath)   . Breast cancer ([place]) 1990  . Colon cancer ([place])     ileostomy 2000  . Osteoporosis     osteopenia   Past Surgical History  Procedure Laterality Date  . Ileostomy    . Breast lumpectomy    . Colon surgery  2000    ileostomy   Current Outpatient Prescriptions on File Prior to Visit  Medication Sig Dispense Refill  . Calcium Carbonate-Vitamin D (CALCIUM + D PO) Take by mouth 2 (two) times daily.      Marland Kitchen levothyroxine (SYNTHROID, LEVOTHROID) 100 MCG tablet Take 1 tablet (100 mcg total) by mouth every other day. 45 tablet 3  . levothyroxine (SYNTHROID, LEVOTHROID) 112 MCG tablet Take 1 tablet (112 mcg total) by mouth every other day. 45 tablet 3  . metoprolol tartrate (LOPRESSOR) 25 MG tablet Take 1 tablet (25 mg total) by mouth 2 (two) times daily. 180 tablet 3  . mometasone-formoterol (DULERA) 100-5  MCG/ACT AERO Inhale 2 puffs into the lungs 2 (two) times daily. 39 g 3  . omeprazole (PRILOSEC) 40 MG capsule TAKE ONE CAPSULE BY MOUTH ONCE DAILY 30 capsule 11   No current facility-administered medications on file prior to visit.   Allergies  Allergen Reactions  . Crestor [Rosuvastatin Calcium]   . Rosuvastatin   . Zetia [Ezetimibe]    Social History   Social History  . Marital Status: Married    Spouse Name: N/A  . Number of Children: N/A  . Years of Education: N/A   Occupational History  . Not on file.   Social History Main Topics  . Smoking status: Never Smoker   . Smokeless tobacco: Never Used  . Alcohol Use: No  . Drug Use: No  . Sexual Activity: Not on file   Other Topics Concern  . Not on file   Social History Narrative      Review of Systems  All other systems reviewed and are negative.      Objective:   Physical Exam  Constitutional: She is oriented to person, place, and time. She appears well-developed and well-nourished. No distress.  Neck: No thyromegaly present.  Cardiovascular: Normal rate, regular rhythm, normal heart sounds and intact distal pulses.  Exam reveals no gallop and no friction rub.   No murmur heard. Pulmonary/Chest: Effort normal and breath sounds  normal. No respiratory distress. She has no wheezes. She has no rales. She exhibits no tenderness.  Abdominal: Soft. Bowel sounds are normal. She exhibits no distension and no mass. There is no tenderness. There is no rebound and no guarding.  Musculoskeletal: She exhibits no edema.  Lymphadenopathy:    She has no cervical adenopathy.  Neurological: She is alert and oriented to person, place, and time. She has normal reflexes. She displays normal reflexes. No cranial nerve deficit. She exhibits normal muscle tone. Coordination normal.  Skin: No rash noted. She is not diaphoretic. No erythema.  Psychiatric: She has a normal mood and affect. Her behavior is normal. Judgment and thought  content normal.  Vitals reviewed.         Assessment & Plan:  Anemia due to other cause - Plan: CBC with Differential/Platelet, COMPLETE METABOLIC PANEL WITH GFR, TSH, Anemia panel  All workup for anemia with a CBC, CMP, and anemia panel, and a TSH. Her physical exam today is completely normal. Her depression screen is negative. Her dementia screen is normal. Therefore I will clinically monitor the patient for any change in her symptoms but at the present time there is no apparent abnormality. The patient minimizes her family's concerns and states that there is nothing wrong. She is quite frustrated at them for making this appointment. Certainly depression versus mild dementia is on the differential diagnosis however I can only go by what the patient is telling me and she denies any problem managing her checkbook. She still plays all her bills. She is still able to perform all of her home responsibilities without problems. Her Mini-Mental status exam today is normal. She denies any symptoms depression

## 2015-06-19 ENCOUNTER — Other Ambulatory Visit: Payer: Self-pay | Admitting: Obstetrics and Gynecology

## 2015-06-19 DIAGNOSIS — Z1231 Encounter for screening mammogram for malignant neoplasm of breast: Secondary | ICD-10-CM

## 2015-06-19 DIAGNOSIS — E2839 Other primary ovarian failure: Secondary | ICD-10-CM

## 2015-07-03 ENCOUNTER — Ambulatory Visit
Admission: RE | Admit: 2015-07-03 | Discharge: 2015-07-03 | Disposition: A | Discharge status: Home / Self Care | Payer: Medicare Other | Source: Ambulatory Visit | Attending: Obstetrics and Gynecology | Admitting: Obstetrics and Gynecology

## 2015-07-03 DIAGNOSIS — Z1231 Encounter for screening mammogram for malignant neoplasm of breast: Secondary | ICD-10-CM

## 2015-07-03 DIAGNOSIS — E2839 Other primary ovarian failure: Secondary | ICD-10-CM

## 2015-09-03 ENCOUNTER — Telehealth: Payer: Self-pay | Admitting: *Deleted

## 2015-09-03 ENCOUNTER — Encounter: Payer: Self-pay | Admitting: Family Medicine

## 2015-09-03 ENCOUNTER — Ambulatory Visit (INDEPENDENT_AMBULATORY_CARE_PROVIDER_SITE_OTHER): Payer: Medicare Other | Admitting: Family Medicine

## 2015-09-03 VITALS — BP 142/74 | HR 82 | Temp 98.1°F | Resp 14 | Ht 66.0 in | Wt 157.0 lb

## 2015-09-03 DIAGNOSIS — M25471 Effusion, right ankle: Secondary | ICD-10-CM | POA: Diagnosis not present

## 2015-09-03 DIAGNOSIS — E559 Vitamin D deficiency, unspecified: Secondary | ICD-10-CM | POA: Diagnosis not present

## 2015-09-03 DIAGNOSIS — R4181 Age-related cognitive decline: Secondary | ICD-10-CM | POA: Diagnosis not present

## 2015-09-03 DIAGNOSIS — E039 Hypothyroidism, unspecified: Secondary | ICD-10-CM | POA: Diagnosis not present

## 2015-09-03 DIAGNOSIS — R42 Dizziness and giddiness: Secondary | ICD-10-CM

## 2015-09-03 LAB — CBC WITH DIFFERENTIAL/PLATELET
Basophils Absolute: 0 cells/uL (ref 0–200)
Basophils Relative: 0 %
EOS PCT: 2 %
Eosinophils Absolute: 116 cells/uL (ref 15–500)
HCT: 38 % (ref 35.0–45.0)
Hemoglobin: 12.5 g/dL (ref 12.0–15.0)
Lymphocytes Relative: 25 %
Lymphs Abs: 1450 cells/uL (ref 850–3900)
MCH: 30.2 pg (ref 27.0–33.0)
MCHC: 32.9 g/dL (ref 32.0–36.0)
MCV: 91.8 fL (ref 80.0–100.0)
MPV: 9.6 fL (ref 7.5–12.5)
Monocytes Absolute: 580 cells/uL (ref 200–950)
Monocytes Relative: 10 %
NEUTROS ABS: 3654 {cells}/uL (ref 1500–7800)
Neutrophils Relative %: 63 %
PLATELETS: 191 10*3/uL (ref 140–400)
RBC: 4.14 MIL/uL (ref 3.80–5.10)
RDW: 13.8 % (ref 11.0–15.0)
WBC: 5.8 10*3/uL (ref 3.8–10.8)

## 2015-09-03 LAB — T3, FREE: T3 FREE: 2.2 pg/mL — AB (ref 2.3–4.2)

## 2015-09-03 LAB — T4, FREE: Free T4: 1.5 ng/dL (ref 0.8–1.8)

## 2015-09-03 LAB — TSH: TSH: 4.38 mIU/L

## 2015-09-03 NOTE — Progress Notes (Signed)
Subjective:    Patient ID: Jocelyn Moss, female    DOB: 09-03-1935, 80 y.o.   MRN: [id number]  Patient presents for Dizziness (reports that she has increased vertgo if she has been working a lot- states that she thinks she has anemia) and Foot Pain (R heel pain- has some swlling in ankle) See the message from patient's daughter when appointment was scheduled for dizzy episodes for past few months. Please see phone note but in general they concerned she may be having mini strokes or have some type of seasonal depression or memory problems. She will have times where she just stares off and becomes withdrawn.She states that she only gets dizzy for a few seconds typically around 10:00 AM after she's been up and moving around cleaning or cooking or even outside in her garden. She has not noticed any change in her blood pressure typically is about 130s every 70s. She is not diabetic. She does have hypothyroidism and alternates her thyroid medication which she has been doing for the past couple of years without any difficulty. She denies a problems with her memory and eyes any headaches chest pain shortness of breath denies any feeling of depression.  Family concerned about anemia as well as vitamin D deficiency which was noted by her OB/GYN last year. Interestingly enough she did not recall that she had been here in January to have her labs done with regards to the concern for anemia in which her labs at that time were all normal   On review of her chart she was seen by her primary care provider in January with the same concerns patient had denied any depression denied any difficulty with sleep Concern possible dementia or mild depression   She also gets intermittent swelling on the medial aspect of her right foot she states that she had plantar fasciitis and had a steroid injection and that helped. She does not have any pain but notices the swelling and occasionally gets some tightness in her foot. She  denies any tingling or numbness no recent injury occasionally takes Aleve swelling does typically go down after she is off of her foot  Review Of Systems:  GEN- denies fatigue, fever, weight loss,weakness, recent illness HEENT- denies eye drainage, change in vision, nasal discharge, CVS- denies chest pain, palpitations RESP- denies SOB, cough, wheeze ABD- denies N/V, change in stools, abd pain GU- denies dysuria, hematuria, dribbling, incontinence MSK- denies oint pain, muscle aches, injury Neuro- denies headache, +dizziness, syncope, seizure activity       Objective:    BP (!) 142/74 (BP Location: Right Arm, Patient Position: Sitting, Cuff Size: Large)   Pulse 82   Temp 98.1 F (36.7 C) (Oral)   Resp 14   Ht '5\' 6"'$  (1.676 m)   Wt 157 lb (71.2 kg)   BMI 25.34 kg/m  GEN- NAD, alert and oriented x3 HEENT- PERRL, EOMI, non injected sclera, pink conjunctiva, MMM, oropharynx clear Neck- Supple, no thyromegaly, no bruit  CVS- RRR, no murmur RESP-CTAB ABD-NABS,soft,NT,ND EXT- Left leg no edema, localized swelling below right medial malleous extends to sole of foot medially, mild TTP, no erythema, no warmth  Psych-pleasant normal affect and mood NEURO-CNII-XII grossly in tact, no deficits  Pulses- Radial, DP- 2+  BP sitting 128/68, standing 126/78      Assessment & Plan:      Problem List Items Addressed This Visit    Vitamin D deficiency   Relevant Orders   Vitamin D, 25-hydroxy  Hypothyroidism - Primary   Relevant Orders   TSH   T3, free   T4, free    Other Visit Diagnoses    Dizziness and giddiness       With her age is maybe more multifactorial. I reviewed previous primary care provider notes. She does have risk factors for cerebrovascular disease. Possible her spells may be due to some fluctuations in her blood pressure there from and I have them check her blood pressure at home. There is no sign of any orthostasis today. I'm also checking her labs to make sure  her thyroid function and renal function are normal. As there is this query of dementia - conginitive decline or other cerebrovascular events going onward and obtain an MRI of the brain patient is aware that we will be doing this.  Not going to change any medications at this time.    Relevant Orders   CBC with Differential/Platelet   Comprehensive metabolic panel   Ankle swelling, right       Chronic intermittent swelling though no pain able to ambulate at baseline seems have more inflammation around the tendon of the foot. We'll have her use Aleve Twice a day for the next couple weeks       Note: This dictation was prepared with Dragon dictation along with smaller phrase technology. Any transcriptional errors that result from this process are unintentional.

## 2015-09-03 NOTE — Patient Instructions (Addendum)
I will check some labs today, to check anemia, thyroid,vitamin D levels  MRI of brain to be done due to spells Okay take Vitamin D in the morning 1000-2000IU once a day  Okay to use aleve twice a day with food for her foot tendinitis for about 2 weeks, then as needed for any pain  Monitor your blood pressure especially after any episodes and record,  call with blood pressure readings in 1 week  Use inhaler twice a day, lungs are clear today no sign of infection  F/U Dr. Dennard Schaumann pending results

## 2015-09-03 NOTE — Telephone Encounter (Signed)
Received call from patient daughter, Levada Dy [phone].  Reports that she has concerns, but does not want to alarm patient. Requested conversation to be kept confidential.   Reports that patient is to be seen on 09/03/2015 for dizziness, but daughter is concerned that patient may have more than vertigo.   Patient daughter states that in Dec 2016, patient voiced C/O intermittent L arm soreness. After x1 week of soreness in arm, patient noted by spouse and daughter to be withdrawn and quiet, displaying a muted personality, and some short term forgetfulness. Patient is currently back to her baseline with her personality.   OBGYN did state in Dec that patient was mildly anemic and Vit D levels were low. Levada Dy is concerned that patient may be experiencing mini- strokes or seasonal depression, and would like some further work- up.   MD to be made aware.

## 2015-09-04 LAB — COMPREHENSIVE METABOLIC PANEL
ALT: 25 U/L (ref 6–29)
AST: 24 U/L (ref 10–35)
Albumin: 4.3 g/dL (ref 3.6–5.1)
Alkaline Phosphatase: 117 U/L (ref 33–130)
BUN: 22 mg/dL (ref 7–25)
CHLORIDE: 100 mmol/L (ref 98–110)
CO2: 22 mmol/L (ref 20–31)
Calcium: 9.9 mg/dL (ref 8.6–10.4)
Creat: 1.09 mg/dL — ABNORMAL HIGH (ref 0.60–0.93)
Glucose, Bld: 87 mg/dL (ref 70–99)
POTASSIUM: 4.2 mmol/L (ref 3.5–5.3)
SODIUM: 136 mmol/L (ref 135–146)
TOTAL PROTEIN: 7.4 g/dL (ref 6.1–8.1)
Total Bilirubin: 0.8 mg/dL (ref 0.2–1.2)

## 2015-09-04 LAB — VITAMIN D 25 HYDROXY (VIT D DEFICIENCY, FRACTURES): Vit D, 25-Hydroxy: 32 ng/mL (ref 30–100)

## 2015-09-09 ENCOUNTER — Telehealth: Payer: Self-pay | Admitting: *Deleted

## 2015-09-09 NOTE — Telephone Encounter (Signed)
Blood pressures look good, lets see what MRI looks like before any other changes

## 2015-09-09 NOTE — Telephone Encounter (Signed)
Call placed to patient and patient made aware.   Of note, received call from patient daughter Jocelyn Moss. Jocelyn Moss requesting information on patient labs.   No DPR noted on file.   Discussed with patient and patient agreed to have labs discussed with daughter.   Call placed to [facility] and daughter made aware.

## 2015-09-09 NOTE — Telephone Encounter (Signed)
Received call from patient with BP readings as follows:   BP  HR 8/9- 130/70  75  141/88  72 8/10- 129/73  53  131/78  53 8/11- 144/82  67  132/73  63 8/12- 129/72  65   140/78  56 8/13- 131/80  64   115/68  60 8/14- 113/62  68  124/75  61 8/15- 134/85  52   MD please advise.

## 2015-09-11 ENCOUNTER — Ambulatory Visit
Admission: RE | Admit: 2015-09-11 | Discharge: 2015-09-11 | Disposition: A | Discharge status: Home / Self Care | Payer: Medicare Other | Source: Ambulatory Visit | Attending: Family Medicine | Admitting: Family Medicine

## 2015-09-11 DIAGNOSIS — R42 Dizziness and giddiness: Secondary | ICD-10-CM

## 2015-09-11 DIAGNOSIS — R4181 Age-related cognitive decline: Secondary | ICD-10-CM

## 2015-09-19 ENCOUNTER — Encounter: Payer: Self-pay | Admitting: Family Medicine

## 2015-09-19 ENCOUNTER — Ambulatory Visit (INDEPENDENT_AMBULATORY_CARE_PROVIDER_SITE_OTHER): Payer: Medicare Other | Admitting: Family Medicine

## 2015-09-19 VITALS — BP 134/74 | HR 68 | Temp 97.4°F | Resp 18 | Wt 158.0 lb

## 2015-09-19 DIAGNOSIS — I6990 Unspecified sequelae of unspecified cerebrovascular disease: Secondary | ICD-10-CM

## 2015-09-19 DIAGNOSIS — I6930 Unspecified sequelae of cerebral infarction: Secondary | ICD-10-CM

## 2015-09-19 MED ORDER — CLOPIDOGREL BISULFATE 75 MG PO TABS: 75.0000 mg | ORAL_TABLET | Freq: Every day | ORAL | 3 refills | Status: DC

## 2015-09-19 MED ORDER — ATORVASTATIN CALCIUM 20 MG PO TABS: 20.0000 mg | ORAL_TABLET | Freq: Every day | ORAL | 3 refills | Status: DC

## 2015-09-19 NOTE — Progress Notes (Signed)
Subjective:    Patient ID: Jocelyn Moss, female    DOB: 04/05/1935, 80 y.o.   MRN: [id number]  HPI Please see my previous office visit. The patient's memory problems have improved. She is accompanied today by her daughter. Her daughter states that since May, the memory problems have improved as the amount of stress the patient was under has improved. Recently she saw my partner for dizzy spells. Does have also improved. However she had an MRI obtained of the brain to evaluate further given her memory problems. This showed multiple areas of chronic microvascular ischemia as well as lacunar infarcts there previously unknown. After discussing the situation with the patient and her daughter, they both report a sudden change in her personality and her memory in December. These only gradually improved after May. At the present time she has no neurologic deficits although she does still have some mild short-term memory loss but is very difficult to discover unless you know the patient very well. She is not taking any antiplatelet agent. She is not taking any cholesterol medication. The last cholesterol see is an LDL cholesterol greater than 150 in 2013. She stopped taking Crestor due to myalgias. Past Medical History:  Diagnosis Date  . Atrial fibrillation ([place])   . Breast cancer ([place]) 1990  . Chest pain   . Colon cancer ([place])    ileostomy 2000  . History of colon cancer   . Hypercholesterolemia   . Hypothyroidism   . MVP (mitral valve prolapse)   . Osteoporosis    osteopenia  . SOB (shortness of breath)    Past Surgical History:  Procedure Laterality Date  . BREAST LUMPECTOMY    . COLON SURGERY  2000   ileostomy  . ILEOSTOMY     Current Outpatient Prescriptions on File Prior to Visit  Medication Sig Dispense Refill  . levothyroxine (SYNTHROID, LEVOTHROID) 100 MCG tablet Take 1 tablet (100 mcg total) by mouth every other day. 45 tablet 3  . levothyroxine (SYNTHROID, LEVOTHROID) 112 MCG  tablet Take 1 tablet (112 mcg total) by mouth every other day. 45 tablet 3  . metoprolol tartrate (LOPRESSOR) 25 MG tablet Take 1 tablet (25 mg total) by mouth 2 (two) times daily. 180 tablet 3  . mometasone-formoterol (DULERA) 100-5 MCG/ACT AERO Inhale 2 puffs into the lungs 2 (two) times daily. 39 g 3  . cholecalciferol (VITAMIN D) 1000 units tablet Take 1,000 Units by mouth daily.    Marland Kitchen omeprazole (PRILOSEC) 40 MG capsule TAKE ONE CAPSULE BY MOUTH ONCE DAILY (Patient not taking: Reported on 09/19/2015) 30 capsule 11   No current facility-administered medications on file prior to visit.    Allergies  Allergen Reactions  . Crestor [Rosuvastatin Calcium]   . Rosuvastatin   . Zetia [Ezetimibe]    Social History   Social History  . Marital status: Married    Spouse name: N/A  . Number of children: N/A  . Years of education: N/A   Occupational History  . Not on file.   Social History Main Topics  . Smoking status: Never Smoker  . Smokeless tobacco: Never Used  . Alcohol use No  . Drug use: No  . Sexual activity: Not on file   Other Topics Concern  . Not on file   Social History Narrative  . No narrative on file      Review of Systems  All other systems reviewed and are negative.      Objective:   Physical Exam  Constitutional: She is oriented to person, place, and time.  Cardiovascular: Normal rate, regular rhythm and normal heart sounds.   No murmur heard. Pulmonary/Chest: Effort normal and breath sounds normal. No respiratory distress. She has no wheezes. She has no rales.  Neurological: She is alert and oriented to person, place, and time. She has normal reflexes. She displays normal reflexes. No cranial nerve deficit. She exhibits normal muscle tone. Coordination normal.  Psychiatric: She has a normal mood and affect. Her behavior is normal. Judgment and thought content normal.  Vitals reviewed.         Assessment & Plan:  Late effect of lacunar infarction  - Plan: clopidogrel (PLAVIX) 75 MG tablet, atorvastatin (LIPITOR) 20 MG tablet  I believe the patient has chronic microvascular damage to the brain and also lacunar infarcts. Typically I believe that these are asymptomatic. However I believe the patient was under a tremendous amount of stress in December caring for family members that only highlighted the mild brain damage that she had. At that stress improved in May, the symptoms improved. Therefore I feel that we need to focus on prevention of further mini strokes, etc. I recommended she start taking Plavix 75 mg a day and Lipitor 20 mg a day and then recheck fasting lipid panel in 3 months. I will use low-dose Lipitor because of her history of statin intolerance.  I spent over 30 minutes with the patient and her daughter discussing the MRI findings, reviewing her medical history, performing her exam, and determining the next course of action.

## 2015-10-21 ENCOUNTER — Ambulatory Visit (INDEPENDENT_AMBULATORY_CARE_PROVIDER_SITE_OTHER): Payer: Medicare Other | Admitting: Podiatry

## 2015-10-21 ENCOUNTER — Other Ambulatory Visit: Payer: Self-pay | Admitting: Podiatry

## 2015-10-21 ENCOUNTER — Ambulatory Visit (INDEPENDENT_AMBULATORY_CARE_PROVIDER_SITE_OTHER): Payer: Medicare Other

## 2015-10-21 ENCOUNTER — Encounter: Payer: Self-pay | Admitting: Podiatry

## 2015-10-21 DIAGNOSIS — R52 Pain, unspecified: Secondary | ICD-10-CM

## 2015-10-21 DIAGNOSIS — M779 Enthesopathy, unspecified: Secondary | ICD-10-CM

## 2015-10-21 DIAGNOSIS — M76829 Posterior tibial tendinitis, unspecified leg: Secondary | ICD-10-CM | POA: Insufficient documentation

## 2015-10-21 DIAGNOSIS — M25471 Effusion, right ankle: Secondary | ICD-10-CM | POA: Diagnosis not present

## 2015-10-21 DIAGNOSIS — M6789 Other specified disorders of synovium and tendon, multiple sites: Secondary | ICD-10-CM

## 2015-10-21 NOTE — Patient Instructions (Signed)
Posterior Tibial Tendon Tendinitis With Rehab Tendonitis is a condition that is characterized by inflammation of a tendon or the lining (sheath) that surrounds it. The inflammation is usually caused by damage to the tendon, such as a tendon tear (strain). Sprains are classified into three categories. Grade 1 sprains cause pain, but the tendon is not lengthened. Grade 2 sprains include a lengthened ligament due to the ligament being stretched or partially ruptured. With grade 2 sprains there is still function, although the function may be diminished. Grade 3 sprains are characterized by a complete tear of the tendon or muscle, and function is usually impaired. Posterior tibialis tendonitis is tendonitis of the posterior tibial tendon, which attaches muscles of the lower leg to the foot. The posterior tibial tendon is located in the back of the ankle and helps the body straighten (plantar flex) and rotate inward (medially rotate) the ankle. SYMPTOMS   Pain, tenderness, swelling, warmth, and/or redness over the back of the inner ankle at the posterior tibial tendon or the inner part of the mid-foot.  Pain that worsens with plantar flexion or medial rotation of the ankle.  A crackling sound (crepitation) when the tendon is moved or touched. CAUSES  Posterior tibial tendonitis occurs when damage to the posterior tibial tendon starts an inflammatory response. Common mechanisms of injury include:  Degenerative (occurs with aging) processes that weaken the tendon and make it more susceptible to injury.  Stress placed on the tendon from an increase in the intensity, frequency, or duration of training.  Direct trauma to the ankle.  Returning to activity before a previous ankle injury is allowed to heal. RISK INCREASES WITH:  Activities that involve repetitive and/or stressful plantar flexion (jumping, kicking, or running up/down hills).  Poor strength and flexibility.  Flat feet.  Previous injury to  the foot, ankle, or leg. PREVENTION   Warm up and stretch properly before activity.  Allow for adequate recovery between workouts.  Maintain physical fitness:  Strength, flexibility, and endurance.  Cardiovascular fitness.  Learn and use proper technique. When possible, have a coach correct improper technique.  Complete rehabilitation from a previous foot, ankle, or leg injury.  If you have flat feet, wear arch supports (orthotics). PROGNOSIS  If treated properly, the symptoms of tendonitis usually resolve within 6 weeks. This period may be shorter for injuries caused by direct trauma. RELATED COMPLICATIONS   Prolonged healing time, if improperly treated or reinjured.  Recurrent symptoms that result in a chronic problem.  Partial or complete tendon tear (rupture) requiring surgery. TREATMENT  Treatment initially involves the use of ice and medication to help reduce pain and inflammation. The use of strengthening and stretching exercises may help reduce pain with activity. These exercises may be performed at home or with referral to a therapist. Often times, your caregiver will recommend immobilizing the ankle to allow the tendon to heal. If you have flat feet, you may be advised to wear orthotic arch supports. If symptoms persist for greater than 6 months despite nonsurgical (conservative) treatment, then surgery may be recommended. MEDICATION   If pain medication is necessary, then nonsteroidal anti-inflammatory medications, such as aspirin and ibuprofen, or other minor pain relievers, such as acetaminophen, are often recommended.  Do not take pain medication for 7 days before surgery.  Prescription pain relievers may be given if deemed necessary by your caregiver. Use only as directed and only as much as you need.  Corticosteroid injections may be given by your caregiver. These injections should  be reserved for the most serious cases because they may only be given a certain  number of times. HEAT AND COLD  Cold treatment (icing) relieves pain and reduces inflammation. Cold treatment should be applied for 10 to 15 minutes every 2 to 3 hours for inflammation and pain and immediately after any activity that aggravates your symptoms. Use ice packs or massage the area with a piece of ice (ice massage).  Heat treatment may be used prior to performing the stretching and strengthening activities prescribed by your caregiver, physical therapist, or athletic trainer. Use a heat pack or soak the injury in warm water. SEEK MEDICAL CARE IF:  Treatment seems to offer no benefit, or the condition worsens.  Any medications produce adverse side effects. EXERCISES RANGE OF MOTION (ROM) AND STRETCHING EXERCISES - Posterior Tibial Tendon Tendinitis These exercises may help you when beginning to rehabilitate your injury. Your symptoms may resolve with or without further involvement from your physician, physical therapist or athletic trainer. While completing these exercises, remember:   Restoring tissue flexibility helps normal motion to return to the joints. This allows healthier, less painful movement and activity.  An effective stretch should be held for at least 30 seconds.  A stretch should never be painful. You should only feel a gentle lengthening or release in the stretched tissue. RANGE OF MOTION - Ankle Plantar Flexion   Sit with your right / left leg crossed over your opposite knee.  Use your opposite hand to pull the top of your foot and toes toward you.  You should feel a gentle stretch on the top of your foot/ankle. Hold this position for __________ seconds. Repeat __________ times. Complete this exercise __________ times per day.  RANGE OF MOTION - Ankle Eversion   Sit with your right / left ankle crossed over your opposite knee.  Grip your foot with your opposite hand, placing your thumb on the top of your foot and your fingers across the bottom of your  foot.  Gently push your foot downward with a slight rotation so your littlest toes rise slightly.  You should feel a gentle stretch on the inside of your ankle. Hold the stretch for __________ seconds. Repeat __________ times. Complete this exercise __________ times per day.  RANGE OF MOTION - Ankle Inversion   Sit with your right / left ankle crossed over your opposite knee.  Grip your foot with your opposite hand, placing your thumb on the bottom of your foot and your fingers across the top of your foot.  Gently pull your foot so the smallest toe comes toward you and your thumb pushes the inside of the ball of your foot away from you.  You should feel a gentle stretch on the outside of your ankle. Hold the stretch for __________ seconds. Repeat __________ times. Complete this exercise __________ times per day.  RANGE OF MOTION - Dorsi/Plantar Flexion  While sitting with your right / left knee straight, draw the top of your foot upward by flexing your ankle. Then reverse the motion, pointing your toes downward.  Hold each position for __________ seconds.  After completing your first set of exercises, repeat this exercise with your knee bent. Repeat __________ times. Complete this exercise __________ times per day.  RANGE OF MOTION - Ankle Alphabet  Imagine your right / left big toe is a pen.  Keeping your hip and knee still, write out the entire alphabet with your "pen." Make the letters as large as you can without  increasing any discomfort. Repeat __________ times. Complete this exercise __________ times per day.  STRETCH - Gastrocsoleus   Sit with your right / left leg extended. Holding onto both ends of a belt or towel, loop it around the ball of your foot.  Keeping your right / left ankle and foot relaxed and your knee straight, pull your foot and ankle toward you using the belt/towel.  You should feel a gentle stretch behind your calf or knee. Hold this position for  __________ seconds. Repeat __________ times. Complete this exercise __________ times per day.  STRETCH - Gastroc, Standing   Place hands on wall.  Extend right / left leg, keeping the front knee somewhat bent.  Slightly point your toes inward on your back foot.  Keeping your right / left heel on the floor and your knee straight, shift your weight toward the wall, not allowing your back to arch.  You should feel a gentle stretch in the right / left calf. Hold this position for __________ seconds. Repeat __________ times. Complete this stretch __________ times per day. STRETCH - Soleus, Standing   Place hands on wall.  Extend right / left leg, keeping the other knee somewhat bent.  Slightly point your toes inward on your back foot.  Keep your right / left heel on the floor, bend your back knee, and slightly shift your weight over the back leg so that you feel a gentle stretch deep in your back calf.  Hold this position for __________ seconds. Repeat __________ times. Complete this stretch __________ times per day. STRENGTHENING EXERCISES - Posterior Tibial Tendon Tendinitis These exercises may help you when beginning to rehabilitate your injury. They may resolve your symptoms with or without further involvement from your physician, physical therapist, or athletic trainer. While completing these exercises, remember:   Muscles can gain both the endurance and the strength needed for everyday activities through controlled exercises.  Complete these exercises as instructed by your physician, physical therapist, or athletic trainer. Progress the resistance and repetitions only as guided. STRENGTH - Dorsiflexors  Secure a rubber exercise band/tubing to a fixed object (i.e., table, pole) and loop the other end around your right / left foot.  Sit on the floor facing the fixed object. The band/tubing should be slightly tense when your foot is relaxed.  Slowly draw your foot back toward you  using your ankle and toes.  Hold this position for __________ seconds. Slowly release the tension in the band and return your foot to the starting position. Repeat __________ times. Complete this exercise __________ times per day.  STRENGTH - Towel Curls  Sit in a chair positioned on a non-carpeted surface.  Place your foot on a towel, keeping your heel on the floor.  Pull the towel toward your heel by only curling your toes. Keep your heel on the floor.  If instructed by your physician, physical therapist, or athletic trainer, add ____________________ at the end of the towel. Repeat __________ times. Complete this exercise __________ times per day. STRENGTH - Ankle Eversion   Secure one end of a rubber exercise band/tubing to a fixed object (table, pole). Loop the other end around your foot just before your toes.  Place your fists between your knees. This will focus your strengthening at your ankle.  Drawing the band/tubing across your opposite foot, slowly pull your little toe out and up. Make sure the band/tubing is positioned to resist the entire motion.  Hold this position for __________ seconds.  Have  your muscles resist the band/tubing as it slowly pulls your foot back to the starting position. Repeat __________ times. Complete this exercise __________ times per day.  STRENGTH - Ankle Inversion   Secure one end of a rubber exercise band/tubing to a fixed object (table, pole). Loop the other end around your foot just before your toes.  Place your fists between your knees. This will focus your strengthening at your ankle.  Slowly, pull your big toe up and in, making sure the band/tubing is positioned to resist the entire motion.  Hold this position for __________ seconds.  Have your muscles resist the band/tubing as it slowly pulls your foot back to the starting position. Repeat __________ times. Complete this exercises __________ times per day.    This information is not  intended to replace advice given to you by your health care provider. Make sure you discuss any questions you have with your health care provider.   Document Released: 01/11/2005 Document Revised: 05/28/2014 Document Reviewed: 04/25/2008 Elsevier Interactive Patient Education [employer].

## 2015-10-21 NOTE — Progress Notes (Signed)
Subjective:     Patient ID: Jocelyn Moss, female   DOB: 09-23-1935, 80 y.o.   MRN: [id number]  HPI 80 year old female presents the office with concerns of right ankle swelling and some very minimal discomfort to the ankle has been ongoing for about 3 months. She states that she gets some stiffness to the ankle when she first gets up but she denies any significant pain. She has noticed some swelling to the ankle as well. Denies any recent injury or trauma. She said no recent treatments. No numbness or tingling. No other complaints at this time.  Review of Systems  All other systems reviewed and are negative.      Objective:   Physical Exam General: AAO x3, NAD  Dermatological: Skin is warm, dry and supple bilateral. Nails x 10 are well manicured; remaining integument appears unremarkable at this time. There are no open sores, no preulcerative lesions, no rash or signs of infection present.  Vascular: Dorsalis Pedis artery and Posterior Tibial artery pedal pulses are 2/4 bilateral with immedate capillary fill time. Pedal hair growth present. No varicosities and no lower extremity edema present bilateral. There is no pain with calf compression, swelling, warmth, erythema.   Neruologic: Grossly intact via light touch bilateral. Vibratory intact via tuning fork bilateral. Protective threshold with Semmes Wienstein monofilament intact to all pedal sites bilateral. Patellar and Achilles deep tendon reflexes 2+ bilateral. No Babinski or clonus noted bilateral.   Musculoskeletal: There is a decrease in medial arch height upon weightbearing on the right foot. There is agreeable to the posterior tibial tendon just inferior to the medial malleolus. There is localized edema to the medial aspect of the ankle on this area without any erythema or increase in warmth. MMT 5/5. There is no tenderness on palpation to the area at this time. There is no pain with ankle or subtalar joint range of motion. There is  no pallor course or insertion of the plantar fascia.  Gait: Unassisted, Nonantalgic.      Assessment:     Right posterior tibial tendon dysfunction    Plan:     -Treatment options discussed including all alternatives, risks, and complications -Etiology of symptoms were discussed -X-rays were obtained and reviewed with the patient. No evidence of acute fracture identified this time. -Dispensed Tri-Lock ankle brace. -Stretching, rehabilitation exercises -Ice to the area -Discussed shoe gear modifications likely custom orthotics in the future. -Follow-up 6 weeks or sooner if any problems arise. In the meantime, encouraged to call the office with any questions, concerns, change in symptoms.   Celesta Gentile, DPM

## 2015-10-28 ENCOUNTER — Ambulatory Visit (INDEPENDENT_AMBULATORY_CARE_PROVIDER_SITE_OTHER): Payer: Medicare Other

## 2015-10-28 DIAGNOSIS — Z23 Encounter for immunization: Secondary | ICD-10-CM | POA: Diagnosis not present

## 2015-12-01 ENCOUNTER — Ambulatory Visit: Payer: Medicare Other | Admitting: Podiatry

## 2015-12-12 ENCOUNTER — Other Ambulatory Visit: Payer: Self-pay | Admitting: Family Medicine

## 2015-12-12 DIAGNOSIS — E038 Other specified hypothyroidism: Secondary | ICD-10-CM

## 2015-12-24 ENCOUNTER — Other Ambulatory Visit: Payer: Self-pay | Admitting: Family Medicine

## 2015-12-24 MED ORDER — MOMETASONE FURO-FORMOTEROL FUM 100-5 MCG/ACT IN AERO
2.0000 | INHALATION_SPRAY | Freq: Two times a day (BID) | RESPIRATORY_TRACT | 3 refills | Status: DC
Start: 2015-12-24 — End: 2017-02-04

## 2015-12-30 ENCOUNTER — Ambulatory Visit (INDEPENDENT_AMBULATORY_CARE_PROVIDER_SITE_OTHER): Payer: Medicare Other | Admitting: Family Medicine

## 2015-12-30 ENCOUNTER — Encounter: Payer: Self-pay | Admitting: Family Medicine

## 2015-12-30 VITALS — BP 140/92 | HR 68 | Temp 98.5°F | Resp 14 | Ht 66.0 in | Wt 159.0 lb

## 2015-12-30 DIAGNOSIS — E038 Other specified hypothyroidism: Secondary | ICD-10-CM

## 2015-12-30 DIAGNOSIS — I6930 Unspecified sequelae of cerebral infarction: Secondary | ICD-10-CM | POA: Diagnosis not present

## 2015-12-30 NOTE — Progress Notes (Signed)
Subjective:    Patient ID: Jocelyn Moss, female    DOB: 06-Jan-1936, 80 y.o.   MRN: [id number]  HPI  08/2015 Please see my previous office visit. The patient's memory problems have improved. She is accompanied today by her daughter. Her daughter states that since May, the memory problems have improved as the amount of stress the patient was under has improved. Recently she saw my partner for dizzy spells. Does have also improved. However she had an MRI obtained of the brain to evaluate further given her memory problems. This showed multiple areas of chronic microvascular ischemia as well as lacunar infarcts there previously unknown. After discussing the situation with the patient and her daughter, they both report a sudden change in her personality and her memory in December. These only gradually improved after May. At the present time she has no neurologic deficits although she does still have some mild short-term memory loss but is very difficult to discover unless you know the patient very well. She is not taking any antiplatelet agent. She is not taking any cholesterol medication. The last cholesterol see is an LDL cholesterol greater than 150 in 2013. She stopped taking Crestor due to myalgias.  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 Typically I believe that these are asymptomatic. However I believe the patient was under a tremendous amount of stress in December caring for family members that only highlighted the mild brain damage that she had. At that stress improved in May, the symptoms improved. Therefore I feel that we need to focus on prevention of further mini strokes, etc. I recommended she start taking Plavix 75 mg a day and Lipitor 20 mg a day and then recheck fasting lipid panel in 3 months. I will use low-dose Lipitor because of her history of statin intolerance.  I spent over 30 minutes with the patient and  her daughter discussing the MRI findings, reviewing her medical history, performing her exam, and determining the next course of action.  12/30/15 She is tolerating Lipitor without difficulty. She denies any myalgias or right upper quadrant pain. She denies any chest pain or shortness of breath. Her blood pressure slightly high today. She states that she is a little anxious in a doctor's office. She denies any worsening memory problems. Her mood and affect are normal. Her family has not witnessed any change in behavior. She denies any new neurologic deficits. She denies any worsening of short-term memory. She has noticed more bruising since switching to Plavix but otherwise she is doing well Past Medical History:  Diagnosis Date  . Atrial fibrillation ([place])   . Breast cancer ([place]) 1990  . Chest pain   . Colon cancer ([place])    ileostomy 2000  . History of colon cancer   . Hypercholesterolemia   . Hypothyroidism   . Multiple lacunar infarcts ([place])   . MVP (mitral valve prolapse)   . Osteoporosis    osteopenia  . SOB (shortness of breath)    Past Surgical History:  Procedure Laterality Date  . BREAST LUMPECTOMY    . COLON SURGERY  2000   ileostomy  . ILEOSTOMY     Current Outpatient Prescriptions on File Prior to Visit  Medication Sig Dispense Refill  . atorvastatin (LIPITOR) 20 MG tablet Take 1 tablet (20 mg total) by mouth daily. 90 tablet 3  . cholecalciferol (VITAMIN D) 1000 units tablet Take 1,000 Units by  mouth daily.    . clopidogrel (PLAVIX) 75 MG tablet Take 1 tablet (75 mg total) by mouth daily. 90 tablet 3  . levothyroxine (SYNTHROID, LEVOTHROID) 100 MCG tablet TAKE ONE TABLET BY MOUTH EVERY OTHER DAY 45 tablet 3  . levothyroxine (SYNTHROID, LEVOTHROID) 112 MCG tablet TAKE ONE TABLET BY MOUTH EVERY OTHER DAY 45 tablet 3  . metoprolol tartrate (LOPRESSOR) 25 MG tablet Take 1 tablet (25 mg total) by mouth 2 (two) times daily. 180 tablet 3  . mometasone-formoterol (DULERA) 100-5  MCG/ACT AERO Inhale 2 puffs into the lungs 2 (two) times daily. 39 g 3   No current facility-administered medications on file prior to visit.    Allergies  Allergen Reactions  . Crestor [Rosuvastatin Calcium]   . Rosuvastatin   . Zetia [Ezetimibe]    Social History   Social History  . Marital status: Married    Spouse name: N/A  . Number of children: N/A  . Years of education: N/A   Occupational History  . Not on file.   Social History Main Topics  . Smoking status: Never Smoker  . Smokeless tobacco: Never Used  . Alcohol use No  . Drug use: No  . Sexual activity: Not on file   Other Topics Concern  . Not on file   Social History Narrative  . No narrative on file      Review of Systems  All other systems reviewed and are negative.      Objective:   Physical Exam  Constitutional: She is oriented to person, place, and time.  Cardiovascular: Normal rate, regular rhythm and normal heart sounds.   No murmur heard. Pulmonary/Chest: Effort normal and breath sounds normal. No respiratory distress. She has no wheezes. She has no rales.  Neurological: She is alert and oriented to person, place, and time. She has normal reflexes. No cranial nerve deficit. She exhibits normal muscle tone. Coordination normal.  Psychiatric: She has a normal mood and affect. Her behavior is normal. Judgment and thought content normal.  Vitals reviewed.         Assessment & Plan:  Late effect of lacunar infarction - Plan: CBC with Differential/Platelet, COMPLETE METABOLIC PANEL WITH GFR, Lipid panel  Other specified hypothyroidism - Plan: TSH  Patient is tolerating Lipitor well. Return fasting for fasting lipid panel. Goal LDL cholesterol is less than 70. Also check a CBC to rule out anemia on Plavix. While the patient is checking lab work I'll also repeat a TSH so that she doesn't have to come back. I like to follow-up her TSH every 6 months. Her blood pressure today is elevated. I've  asked the patient check her blood pressure everyday and repeated tomorrow when she comes in fasting for lab work. If consistently greater than 140/90, I will start the patient on an ACE inhibitor.

## 2016-01-02 ENCOUNTER — Other Ambulatory Visit: Payer: Medicare Other

## 2016-01-02 ENCOUNTER — Telehealth: Payer: Self-pay

## 2016-01-02 DIAGNOSIS — I6930 Unspecified sequelae of cerebral infarction: Secondary | ICD-10-CM

## 2016-01-02 DIAGNOSIS — E038 Other specified hypothyroidism: Secondary | ICD-10-CM

## 2016-01-02 LAB — CBC WITH DIFFERENTIAL/PLATELET
BASOS ABS: 52 {cells}/uL (ref 0–200)
Basophils Relative: 1 %
EOS ABS: 208 {cells}/uL (ref 15–500)
Eosinophils Relative: 4 %
HCT: 36.5 % (ref 35.0–45.0)
Hemoglobin: 11.8 g/dL — ABNORMAL LOW (ref 12.0–15.0)
LYMPHS PCT: 22 %
Lymphs Abs: 1144 cells/uL (ref 850–3900)
MCH: 30.7 pg (ref 27.0–33.0)
MCHC: 32.3 g/dL (ref 32.0–36.0)
MCV: 95.1 fL (ref 80.0–100.0)
MONOS PCT: 9 %
MPV: 9.4 fL (ref 7.5–12.5)
Monocytes Absolute: 468 cells/uL (ref 200–950)
NEUTROS ABS: 3328 {cells}/uL (ref 1500–7800)
Neutrophils Relative %: 64 %
PLATELETS: 192 10*3/uL (ref 140–400)
RBC: 3.84 MIL/uL (ref 3.80–5.10)
RDW: 13.5 % (ref 11.0–15.0)
WBC: 5.2 10*3/uL (ref 3.8–10.8)

## 2016-01-02 LAB — COMPLETE METABOLIC PANEL WITH GFR
ALK PHOS: 91 U/L (ref 33–130)
ALT: 12 U/L (ref 6–29)
AST: 20 U/L (ref 10–35)
Albumin: 4.1 g/dL (ref 3.6–5.1)
BILIRUBIN TOTAL: 0.8 mg/dL (ref 0.2–1.2)
BUN: 22 mg/dL (ref 7–25)
CO2: 23 mmol/L (ref 20–31)
Calcium: 8.8 mg/dL (ref 8.6–10.4)
Chloride: 105 mmol/L (ref 98–110)
Creat: 1.03 mg/dL — ABNORMAL HIGH (ref 0.60–0.93)
GFR, EST AFRICAN AMERICAN: 60 mL/min (ref >=60)
GFR, EST NON AFRICAN AMERICAN: 52 mL/min — AB (ref >=60)
Glucose, Bld: 85 mg/dL (ref 70–99)
Potassium: 4.1 mmol/L (ref 3.5–5.3)
Sodium: 137 mmol/L (ref 135–146)
TOTAL PROTEIN: 6.9 g/dL (ref 6.1–8.1)

## 2016-01-02 LAB — LIPID PANEL
CHOLESTEROL: 135 mg/dL (ref <200)
HDL: 69 mg/dL (ref >50)
LDL Cholesterol: 52 mg/dL (ref <100)
TRIGLYCERIDES: 72 mg/dL (ref <150)
Total CHOL/HDL Ratio: 2 Ratio (ref <5.0)
VLDL: 14 mg/dL (ref <30)

## 2016-01-02 LAB — TSH: TSH: 2.07 mIU/L

## 2016-01-02 NOTE — Telephone Encounter (Signed)
Pt was in office today for Blood work and wanted me to give you her Blood pressure readings for Wed 12-6, Thur 12-7 and Fri 12-8  Wed- 8:20am    135/70          11:05pm   129/76  Thur- 9:07am    125/67           11:20pm  155/91/ Pt states she had Ham this day  Fri-     7:05am    153/85

## 2016-01-05 NOTE — Telephone Encounter (Signed)
Keep an eye on bp.  One of the numbers was high.  Lets get 1 weeks worth to see if its a fluke

## 2016-01-05 NOTE — Telephone Encounter (Signed)
Patient aware of providers recommendations.  

## 2016-01-05 NOTE — Telephone Encounter (Signed)
LMTRC

## 2016-01-07 ENCOUNTER — Other Ambulatory Visit: Payer: Self-pay | Admitting: Family Medicine

## 2016-01-14 ENCOUNTER — Telehealth: Payer: Self-pay | Admitting: Family Medicine

## 2016-01-14 NOTE — Telephone Encounter (Signed)
134/70-57 141/80-60 131/67-57 143/87-59 123/64-56 133/78-56 141/79-74 136/78-69 132/75-55 124/65-60 140/80-58 143/77-51 135/85-54 121/65-58 137/78-66 

## 2016-01-15 NOTE — Telephone Encounter (Signed)
Patient aware.

## 2016-01-15 NOTE — Telephone Encounter (Signed)
These are better and good enough.

## 2016-02-06 ENCOUNTER — Inpatient Hospital Stay (HOSPITAL_COMMUNITY)
Admission: EM | Admit: 2016-02-06 | Discharge: 2016-02-08 | DRG: 683 | Disposition: A | Discharge status: Home / Self Care | Payer: Medicare Other | Attending: Internal Medicine | Admitting: Internal Medicine

## 2016-02-06 ENCOUNTER — Telehealth: Payer: Self-pay | Admitting: Family Medicine

## 2016-02-06 ENCOUNTER — Encounter (HOSPITAL_COMMUNITY): Payer: Self-pay

## 2016-02-06 DIAGNOSIS — E785 Hyperlipidemia, unspecified: Secondary | ICD-10-CM | POA: Diagnosis not present

## 2016-02-06 DIAGNOSIS — J45909 Unspecified asthma, uncomplicated: Secondary | ICD-10-CM | POA: Diagnosis not present

## 2016-02-06 DIAGNOSIS — E861 Hypovolemia: Secondary | ICD-10-CM | POA: Diagnosis present

## 2016-02-06 DIAGNOSIS — Z803 Family history of malignant neoplasm of breast: Secondary | ICD-10-CM

## 2016-02-06 DIAGNOSIS — Z85038 Personal history of other malignant neoplasm of large intestine: Secondary | ICD-10-CM | POA: Diagnosis not present

## 2016-02-06 DIAGNOSIS — A09 Infectious gastroenteritis and colitis, unspecified: Secondary | ICD-10-CM | POA: Diagnosis not present

## 2016-02-06 DIAGNOSIS — Z79899 Other long term (current) drug therapy: Secondary | ICD-10-CM | POA: Diagnosis not present

## 2016-02-06 DIAGNOSIS — Z932 Ileostomy status: Secondary | ICD-10-CM | POA: Diagnosis not present

## 2016-02-06 DIAGNOSIS — Z853 Personal history of malignant neoplasm of breast: Secondary | ICD-10-CM

## 2016-02-06 DIAGNOSIS — E860 Dehydration: Secondary | ICD-10-CM | POA: Diagnosis not present

## 2016-02-06 DIAGNOSIS — Z801 Family history of malignant neoplasm of trachea, bronchus and lung: Secondary | ICD-10-CM

## 2016-02-06 DIAGNOSIS — I951 Orthostatic hypotension: Secondary | ICD-10-CM | POA: Diagnosis present

## 2016-02-06 DIAGNOSIS — I4891 Unspecified atrial fibrillation: Secondary | ICD-10-CM | POA: Diagnosis present

## 2016-02-06 DIAGNOSIS — M810 Age-related osteoporosis without current pathological fracture: Secondary | ICD-10-CM | POA: Diagnosis present

## 2016-02-06 DIAGNOSIS — R55 Syncope and collapse: Secondary | ICD-10-CM

## 2016-02-06 DIAGNOSIS — E7800 Pure hypercholesterolemia, unspecified: Secondary | ICD-10-CM | POA: Diagnosis present

## 2016-02-06 DIAGNOSIS — N179 Acute kidney failure, unspecified: Secondary | ICD-10-CM | POA: Diagnosis not present

## 2016-02-06 DIAGNOSIS — A0811 Acute gastroenteropathy due to Norwalk agent: Secondary | ICD-10-CM | POA: Diagnosis present

## 2016-02-06 DIAGNOSIS — E039 Hypothyroidism, unspecified: Secondary | ICD-10-CM | POA: Diagnosis present

## 2016-02-06 DIAGNOSIS — Z9049 Acquired absence of other specified parts of digestive tract: Secondary | ICD-10-CM | POA: Diagnosis not present

## 2016-02-06 DIAGNOSIS — Z7902 Long term (current) use of antithrombotics/antiplatelets: Secondary | ICD-10-CM | POA: Diagnosis not present

## 2016-02-06 DIAGNOSIS — I341 Nonrheumatic mitral (valve) prolapse: Secondary | ICD-10-CM | POA: Diagnosis not present

## 2016-02-06 DIAGNOSIS — R112 Nausea with vomiting, unspecified: Secondary | ICD-10-CM

## 2016-02-06 DIAGNOSIS — R197 Diarrhea, unspecified: Secondary | ICD-10-CM | POA: Diagnosis present

## 2016-02-06 LAB — COMPREHENSIVE METABOLIC PANEL
ALBUMIN: 3.5 g/dL (ref 3.5–5.0)
ALT: 19 U/L (ref 14–54)
ANION GAP: 9 (ref 5–15)
AST: 28 U/L (ref 15–41)
Alkaline Phosphatase: 96 U/L (ref 38–126)
BILIRUBIN TOTAL: 1.2 mg/dL (ref 0.3–1.2)
BUN: 33 mg/dL — ABNORMAL HIGH (ref 6–20)
CO2: 20 mmol/L — AB (ref 22–32)
Calcium: 8.9 mg/dL (ref 8.9–10.3)
Chloride: 103 mmol/L (ref 101–111)
Creatinine, Ser: 2.32 mg/dL — ABNORMAL HIGH (ref 0.44–1.00)
GFR calc Af Amer: 22 mL/min — ABNORMAL LOW (ref >60)
GFR calc non Af Amer: 19 mL/min — ABNORMAL LOW (ref >60)
GLUCOSE: 152 mg/dL — AB (ref 65–99)
POTASSIUM: 4.4 mmol/L (ref 3.5–5.1)
SODIUM: 132 mmol/L — AB (ref 135–145)
TOTAL PROTEIN: 7.5 g/dL (ref 6.5–8.1)

## 2016-02-06 LAB — URINALYSIS, ROUTINE W REFLEX MICROSCOPIC
Bilirubin Urine: NEGATIVE
Glucose, UA: NEGATIVE mg/dL
Hgb urine dipstick: NEGATIVE
KETONES UR: NEGATIVE mg/dL
Nitrite: NEGATIVE
PROTEIN: 30 mg/dL — AB
Specific Gravity, Urine: 1.019 (ref 1.005–1.030)
pH: 5 (ref 5.0–8.0)

## 2016-02-06 LAB — LIPASE, BLOOD: Lipase: 26 U/L (ref 11–51)

## 2016-02-06 LAB — CBC
HEMATOCRIT: 39.8 % (ref 36.0–46.0)
Hemoglobin: 12.9 g/dL (ref 12.0–15.0)
MCH: 30.4 pg (ref 26.0–34.0)
MCHC: 32.4 g/dL (ref 30.0–36.0)
MCV: 93.6 fL (ref 78.0–100.0)
Platelets: 194 10*3/uL (ref 150–400)
RBC: 4.25 MIL/uL (ref 3.87–5.11)
RDW: 13.7 % (ref 11.5–15.5)
WBC: 16.1 10*3/uL — ABNORMAL HIGH (ref 4.0–10.5)

## 2016-02-06 LAB — CBG MONITORING, ED: Glucose-Capillary: 137 mg/dL — ABNORMAL HIGH (ref 65–99)

## 2016-02-06 MED ORDER — ATORVASTATIN CALCIUM 20 MG PO TABS
20.0000 mg | ORAL_TABLET | Freq: Every day | ORAL | Status: DC
  Administered 2016-02-07: 20 mg via ORAL
  Filled 2016-02-06: qty 1

## 2016-02-06 MED ORDER — LEVOTHYROXINE SODIUM 100 MCG PO TABS
100.0000 ug | ORAL_TABLET | ORAL | Status: DC
  Administered 2016-02-07: 100 ug via ORAL
  Filled 2016-02-06: qty 1

## 2016-02-06 MED ORDER — SODIUM CHLORIDE 0.9 % IV SOLN
Freq: Once | INTRAVENOUS | Status: AC
  Administered 2016-02-06: 16:00:00 via INTRAVENOUS

## 2016-02-06 MED ORDER — SODIUM CHLORIDE 0.9 % IV SOLN
INTRAVENOUS | Status: DC
Start: 2016-02-06 — End: 2016-02-08
  Administered 2016-02-07 (×2): via INTRAVENOUS

## 2016-02-06 MED ORDER — MOMETASONE FURO-FORMOTEROL FUM 100-5 MCG/ACT IN AERO
2.0000 | INHALATION_SPRAY | Freq: Two times a day (BID) | RESPIRATORY_TRACT | Status: DC
  Administered 2016-02-07 (×2): 2 via RESPIRATORY_TRACT
  Filled 2016-02-06: qty 8.8

## 2016-02-06 MED ORDER — ONDANSETRON HCL 4 MG/2ML IJ SOLN: 4.0000 mg | Freq: Three times a day (TID) | INTRAMUSCULAR | Status: DC | PRN

## 2016-02-06 MED ORDER — ACETAMINOPHEN 325 MG PO TABS: 650.0000 mg | ORAL_TABLET | Freq: Four times a day (QID) | ORAL | Status: DC | PRN

## 2016-02-06 MED ORDER — ACETAMINOPHEN 650 MG RE SUPP: 650.0000 mg | Freq: Four times a day (QID) | RECTAL | Status: DC | PRN

## 2016-02-06 MED ORDER — ENOXAPARIN SODIUM 40 MG/0.4ML ~~LOC~~ SOLN: 40.0000 mg | SUBCUTANEOUS | Status: DC

## 2016-02-06 MED ORDER — ALUM & MAG HYDROXIDE-SIMETH 200-200-20 MG/5ML PO SUSP
30.0000 mL | ORAL | Status: DC | PRN
  Filled 2016-02-06: qty 30

## 2016-02-06 MED ORDER — SODIUM CHLORIDE 0.9 % IV SOLN: INTRAVENOUS | Status: DC

## 2016-02-06 MED ORDER — SODIUM CHLORIDE 0.9% FLUSH
3.0000 mL | Freq: Two times a day (BID) | INTRAVENOUS | Status: DC
  Administered 2016-02-07 (×2): 3 mL via INTRAVENOUS

## 2016-02-06 MED ORDER — CLOPIDOGREL BISULFATE 75 MG PO TABS
75.0000 mg | ORAL_TABLET | Freq: Every day | ORAL | Status: DC
  Administered 2016-02-07 – 2016-02-08 (×2): 75 mg via ORAL
  Filled 2016-02-06 (×2): qty 1

## 2016-02-06 MED ORDER — ONDANSETRON HCL 4 MG PO TABS: 4.0000 mg | ORAL_TABLET | Freq: Four times a day (QID) | ORAL | Status: DC | PRN

## 2016-02-06 MED ORDER — SODIUM CHLORIDE 0.9 % IV BOLUS (SEPSIS)
1000.0000 mL | Freq: Once | INTRAVENOUS | Status: AC
  Administered 2016-02-06: 1000 mL via INTRAVENOUS

## 2016-02-06 MED ORDER — LOPERAMIDE HCL 2 MG PO CAPS: 4.0000 mg | ORAL_CAPSULE | ORAL | Status: DC | PRN

## 2016-02-06 MED ORDER — LOPERAMIDE HCL 2 MG PO CAPS
4.0000 mg | ORAL_CAPSULE | Freq: Once | ORAL | Status: AC
Start: 2016-02-06 — End: 2016-02-06
  Administered 2016-02-06: 4 mg via ORAL
  Filled 2016-02-06: qty 2

## 2016-02-06 MED ORDER — ONDANSETRON HCL 4 MG/2ML IJ SOLN: 4.0000 mg | Freq: Four times a day (QID) | INTRAMUSCULAR | Status: DC | PRN

## 2016-02-06 MED ORDER — ONDANSETRON HCL 4 MG/2ML IJ SOLN
4.0000 mg | Freq: Once | INTRAMUSCULAR | Status: AC
Start: 2016-02-06 — End: 2016-02-06
  Administered 2016-02-06: 4 mg via INTRAVENOUS
  Filled 2016-02-06: qty 2

## 2016-02-06 MED ORDER — LEVOTHYROXINE SODIUM 112 MCG PO TABS
112.0000 ug | ORAL_TABLET | ORAL | Status: DC
  Administered 2016-02-08: 112 ug via ORAL
  Filled 2016-02-06 (×2): qty 1

## 2016-02-06 NOTE — Telephone Encounter (Signed)
Left msg stating that they had been called out along with EMS and they are transferring patient to [facility]. He didn't leave any name or contact information.

## 2016-02-06 NOTE — H&P (Signed)
History and Physical  Jocelyn Moss OVF:[id number] DOB: July 29, 1935 DOA: 02/06/2016  Referring physician: Clayton Bibles, PA-C, ED provider PCP: Odette Fraction, MD  Outpatient Specialists:  Patient coming from home  Chief Complaint: Syncopal episode with nausea, vomiting, diarrhea, fever  HPI: Jocelyn Moss is a 81 y.o. female with a history of atrial fibrillation, currently in sinus rhythm, history of colon cancer status post resection with ileostomy, hypothyroidism. Patient has 36 hours with increasing diarrhea with nausea and vomiting. Patient had 3 syncopal episodes at home. After the first, the patient's daughter called EMS, who came and gave her IV fluids. The patient declined going to the ER at that point. The patient had 2 further episodes of syncope. EMS was called again and the patient was transported to the hospital. The patient has been having diarrhea and having to change her ostomy bag every hour or so with extremely watery diarrhea. No blood in the stool.  Emergency Department Course: Patient orthostatic with   Review of Systems:   Pt denies any chills, constipation, abdominal pain, shortness of breath, dyspnea on exertion, orthopnea, cough, wheezing, palpitations, headache, vision changes, lightheadedness, dizziness, melena, rectal bleeding.  Review of systems are otherwise negative  Past Medical History:  Diagnosis Date  . Atrial fibrillation ([place])   . Breast cancer ([place]) 1990  . Chest pain   . Colon cancer ([place])    ileostomy 2000  . History of colon cancer   . Hypercholesterolemia   . Hypothyroidism   . Multiple lacunar infarcts ([place])   . MVP (mitral valve prolapse)   . Osteoporosis    osteopenia  . SOB (shortness of breath)    Past Surgical History:  Procedure Laterality Date  . BREAST LUMPECTOMY    . COLON SURGERY  2000   ileostomy  . ILEOSTOMY     Social History:  reports that she has never smoked. She has never used smokeless tobacco. She reports  that she does not drink alcohol or use drugs. Patient lives at home  Allergies  Allergen Reactions  . Crestor [Rosuvastatin Calcium] Other (See Comments)    Joint pain, effected liver function   . Rosuvastatin Other (See Comments)    Joint pain, effected liver function  . Zetia [Ezetimibe] Other (See Comments)    Joint pain, effected liver function     Family History  Problem Relation Age of Onset  . Lung cancer Mother   . Lung cancer Father   . Lung cancer Sister   . Breast cancer Sister      Prior to Admission medications   Medication Sig Start Date End Date Taking? Authorizing Provider  atorvastatin (LIPITOR) 20 MG tablet Take 1 tablet (20 mg total) by mouth daily. 09/19/15  Yes Susy Frizzle, MD  clopidogrel (PLAVIX) 75 MG tablet Take 1 tablet (75 mg total) by mouth daily. 09/19/15  Yes Susy Frizzle, MD  levothyroxine (SYNTHROID, LEVOTHROID) 100 MCG tablet TAKE ONE TABLET BY MOUTH EVERY OTHER DAY 12/12/15  Yes Susy Frizzle, MD  levothyroxine (SYNTHROID, LEVOTHROID) 112 MCG tablet TAKE ONE TABLET BY MOUTH EVERY OTHER DAY 12/12/15  Yes Susy Frizzle, MD  metoprolol tartrate (LOPRESSOR) 25 MG tablet TAKE ONE TABLET BY MOUTH TWICE DAILY 01/07/16  Yes Susy Frizzle, MD  mometasone-formoterol (DULERA) 100-5 MCG/ACT AERO Inhale 2 puffs into the lungs 2 (two) times daily. 12/24/15  Yes Susy Frizzle, MD    Physical Exam: BP 106/67   Pulse 71   Temp 98.9 F (  37.2 C) (Oral)   Resp 19   Ht '5\' 8"'$  (1.727 m)   Wt 70.3 kg (155 lb)   SpO2 97%   BMI 23.57 kg/m   General: Elderly Caucasian female. Awake and alert and oriented x3. No acute cardiopulmonary distress.  HEENT: Normocephalic atraumatic.  Right and left ears normal in appearance.  Pupils equal, round, reactive to light. Extraocular muscles are intact. Sclerae anicteric and noninjected.  Moist mucosal membranes. No mucosal lesions.  Neck: Neck supple without lymphadenopathy. No carotid bruits. No masses  palpated.  Cardiovascular: Regular rate with normal S1-S2 sounds. No murmurs, rubs, gallops auscultated. No JVD.  Respiratory: Good respiratory effort with no wheezes, rales, rhonchi. Lungs clear to auscultation bilaterally.  No accessory muscle use. Abdomen: Soft, nontender, nondistended. Active bowel sounds. No masses or hepatosplenomegaly  Skin: No rashes, lesions, or ulcerations.  Dry, warm to touch. 2+ dorsalis pedis and radial pulses. Musculoskeletal: No calf or leg pain. All major joints not erythematous nontender.  No upper or lower joint deformation.  Good ROM.  No contractures  Psychiatric: Intact judgment and insight. Pleasant and cooperative. Neurologic: No focal neurological deficits. Strength is 5/5 and symmetric in upper and lower extremities.  Cranial nerves II through XII are grossly intact.           Labs on Admission: I have personally reviewed following labs and imaging studies  CBC:  Recent Labs Lab 02/06/16 1438  WBC 16.1*  HGB 12.9  HCT 39.8  MCV 93.6  PLT 308   Basic Metabolic Panel:  Recent Labs Lab 02/06/16 1432  NA 132*  K 4.4  CL 103  CO2 20*  GLUCOSE 152*  BUN 33*  CREATININE 2.32*  CALCIUM 8.9   GFR: Estimated Creatinine Clearance: 20.2 mL/min (by C-G formula based on SCr of 2.32 mg/dL (H)). Liver Function Tests:  Recent Labs Lab 02/06/16 1432  AST 28  ALT 19  ALKPHOS 96  BILITOT 1.2  PROT 7.5  ALBUMIN 3.5    Recent Labs Lab 02/06/16 1432  LIPASE 26   No results for input(s): AMMONIA in the last 168 hours. Coagulation Profile: No results for input(s): INR, PROTIME in the last 168 hours. Cardiac Enzymes: No results for input(s): CKTOTAL, CKMB, CKMBINDEX, TROPONINI in the last 168 hours. BNP (last 3 results) No results for input(s): PROBNP in the last 8760 hours. HbA1C: No results for input(s): HGBA1C in the last 72 hours. CBG:  Recent Labs Lab 02/06/16 1416  GLUCAP 137*   Lipid Profile: No results for input(s):  CHOL, HDL, LDLCALC, TRIG, CHOLHDL, LDLDIRECT in the last 72 hours. Thyroid Function Tests: No results for input(s): TSH, T4TOTAL, FREET4, T3FREE, THYROIDAB in the last 72 hours. Anemia Panel: No results for input(s): VITAMINB12, FOLATE, FERRITIN, TIBC, IRON, RETICCTPCT in the last 72 hours. Urine analysis:    Component Value Date/Time   COLORURINE ORANGE (A) 12/24/2013 1010   APPEARANCEUR CLOUDY (A) 12/24/2013 1010   LABSPEC 1.015 12/24/2013 1010   PHURINE 5.5 12/24/2013 1010   GLUCOSEU 100 (A) 12/24/2013 1010   HGBUR MOD (A) 12/24/2013 1010   BILIRUBINUR NEG 12/24/2013 1010   KETONESUR NEG 12/24/2013 1010   PROTEINUR TRACE 12/24/2013 1010   UROBILINOGEN 1 12/24/2013 1010   NITRITE POS (A) 12/24/2013 1010   LEUKOCYTESUR LARGE (A) 12/24/2013 1010   Sepsis Labs: '@LABRCNTIP'$ (procalcitonin:4,lacticidven:4) )No results found for this or any previous visit (from the past 240 hour(s)).   Radiological Exams on Admission: No results found.  EKG: Independently reviewed. Sinus arrhythmia  Assessment/Plan: Principal Problem:   Syncope due to orthostatic hypotension Active Problems:   AKI (acute kidney injury) ([place])   Orthostasis   Dehydration   Diarrhea    This patient was discussed with the ED physician, including pertinent vitals, physical exam findings, labs, and imaging.  We also discussed care given by the ED provider.  #1 syncope secondary to dehydration and orthostatic hypotension  Observation  Likely secondary to viral syndrome  IV fluids, Imodium, telemetry monitoring #2 acute renal insufficiency  Fluid hydrated  Recheck creatinine in the morning #3 diarrhea  Imodium #4 dehydration  Improving #5 orthostasis  Recheck orthostatics in the morning  DVT prophylaxis: Lovenox Consultants: None Code Status: Full code Family Communication: Daughter in the room  Disposition Plan: Patient should be able to return home   Truett Mainland, DO [facility] Pager [phone]  If 7PM-7AM, please contact night-coverage [email or web address] Password [email or web address]

## 2016-02-06 NOTE — ED Provider Notes (Signed)
[facility] DEPT Provider Note   CSN: [id number] Arrival date & time: 02/06/16  1350     History   Chief Complaint Chief Complaint  Patient presents with  . Loss of Consciousness  . Emesis    HPI Jocelyn Moss is a 81 y.o. female.  HPI   Pt with hx afib, colon and breast cancer s/p ileostomy p/w N/V/D and 3 episodes of syncope that began around 1:30am.  She began with nausea and vomiting, occurring 4 times, then began having diarrhea.  The diarrhea is watery and green with some food particles in it including oatmeal and tomato peels.   They have had to empty the bag (1/2 filled) every 30 minutes.  They have not witnessed frank blood.  Three times during the day when getting up and down to the bathroom she experienced syncopal episodes.  These were witnessed by her daughter.  EMS was called twice.  The first time she was given 500cc bolus and decided against transport to the hospital.  The second time she was again given 500cc bolus but agreed to transport.  Pt was exposed to husband who had similar symptoms that began two days ago.  Pt denies fevers, abdominal pain, urinary symptoms.    Past Medical History:  Diagnosis Date  . Atrial fibrillation ([place])   . Breast cancer ([place]) 1990  . Chest pain   . Colon cancer ([place])    ileostomy 2000  . History of colon cancer   . Hypercholesterolemia   . Hypothyroidism   . Multiple lacunar infarcts ([place])   . MVP (mitral valve prolapse)   . Osteoporosis    osteopenia  . SOB (shortness of breath)     Patient Active Problem List   Diagnosis Date Noted  . AKI (acute kidney injury) ([place]) 02/06/2016  . Orthostasis 02/06/2016  . Dehydration 02/06/2016  . Diarrhea 02/06/2016  . Syncope due to orthostatic hypotension 02/06/2016  . PTTD (posterior tibial tendon dysfunction) 10/21/2015  . Vitamin D deficiency 09/03/2015  . Osteopenia 03/15/2012  . MVP (mitral valve prolapse)   . Hypercholesterolemia   . History of colon cancer   .  Hypothyroidism   . Chest pain   . Atrial fibrillation ([place])   . SOB (shortness of breath)   . Breast cancer ([place])   . COUGH 02/21/2009  . PULMONARY COLLAPSE 12/24/2008  . SWELLING, MASS, OR LUMP IN CHEST 12/24/2008  . HYPOTHYROIDISM 12/23/2008  . HYPERLIPIDEMIA 12/23/2008    Past Surgical History:  Procedure Laterality Date  . BREAST LUMPECTOMY    . COLON SURGERY  2000   ileostomy  . ILEOSTOMY      OB History    No data available       Home Medications    Prior to Admission medications   Medication Sig Start Date End Date Taking? Authorizing Provider  atorvastatin (LIPITOR) 20 MG tablet Take 1 tablet (20 mg total) by mouth daily. 09/19/15  Yes Susy Frizzle, MD  clopidogrel (PLAVIX) 75 MG tablet Take 1 tablet (75 mg total) by mouth daily. 09/19/15  Yes Susy Frizzle, MD  levothyroxine (SYNTHROID, LEVOTHROID) 100 MCG tablet TAKE ONE TABLET BY MOUTH EVERY OTHER DAY 12/12/15  Yes Susy Frizzle, MD  levothyroxine (SYNTHROID, LEVOTHROID) 112 MCG tablet TAKE ONE TABLET BY MOUTH EVERY OTHER DAY 12/12/15  Yes Susy Frizzle, MD  metoprolol tartrate (LOPRESSOR) 25 MG tablet TAKE ONE TABLET BY MOUTH TWICE DAILY 01/07/16  Yes Susy Frizzle, MD  mometasone-formoterol [facility])  100-5 MCG/ACT AERO Inhale 2 puffs into the lungs 2 (two) times daily. 12/24/15  Yes Susy Frizzle, MD    Family History Family History  Problem Relation Age of Onset  . Lung cancer Mother   . Lung cancer Father   . Lung cancer Sister   . Breast cancer Sister     Social History Social History  Substance Use Topics  . Smoking status: Never Smoker  . Smokeless tobacco: Never Used  . Alcohol use No     Allergies   Crestor [rosuvastatin calcium]; Rosuvastatin; and Zetia [ezetimibe]   Review of Systems Review of Systems  All other systems reviewed and are negative.    Physical Exam Updated Vital Signs BP 95/63   Pulse 71   Temp 98.9 F (37.2 C) (Oral)   Resp 22   Ht '5\' 8"'$   (1.727 m)   Wt 70.3 kg   SpO2 97%   BMI 23.57 kg/m   Physical Exam  Constitutional: She appears well-developed and well-nourished. No distress.  HENT:  Head: Normocephalic and atraumatic.  Neck: Neck supple.  Cardiovascular: Normal rate and regular rhythm.   Pulmonary/Chest: Effort normal and breath sounds normal. No respiratory distress. She has no wheezes. She has no rales.  Abdominal: Soft. She exhibits no distension. There is no tenderness. There is no rebound and no guarding.  RLQ ostomy bag with watery green stool with food debris   Neurological: She is alert.  Skin: She is not diaphoretic.  Nursing note and vitals reviewed.    ED Treatments / Results  Labs (all labs ordered are listed, but only abnormal results are displayed) Labs Reviewed  CBC - Abnormal; Notable for the following:       Result Value   WBC 16.1 (*)    All other components within normal limits  URINALYSIS, ROUTINE W REFLEX MICROSCOPIC - Abnormal; Notable for the following:    Color, Urine AMBER (*)    APPearance HAZY (*)    Protein, ur 30 (*)    Leukocytes, UA LARGE (*)    Bacteria, UA RARE (*)    Squamous Epithelial / LPF 0-5 (*)    Non Squamous Epithelial 0-5 (*)    All other components within normal limits  COMPREHENSIVE METABOLIC PANEL - Abnormal; Notable for the following:    Sodium 132 (*)    CO2 20 (*)    Glucose, Bld 152 (*)    BUN 33 (*)    Creatinine, Ser 2.32 (*)    GFR calc non Af Amer 19 (*)    GFR calc Af Amer 22 (*)    All other components within normal limits  CBG MONITORING, ED - Abnormal; Notable for the following:    Glucose-Capillary 137 (*)    All other components within normal limits  LIPASE, BLOOD    EKG  EKG Interpretation  Date/Time:  Friday February 06 2016 13:58:04 EST Ventricular Rate:  66 PR Interval:    QRS Duration: 94 QT Interval:  454 QTC Calculation: 476 R Axis:   27 Text Interpretation:  Sinus arrhythmia NO sig change from old. Confirmed by  Johnney Killian, MD, Jeannie Done [phone]) on 02/06/2016 2:29:46 PM       Radiology No results found.  Procedures Procedures (including critical care time)  Medications Ordered in ED Medications  ondansetron (ZOFRAN) injection 4 mg (not administered)  loperamide (IMODIUM) capsule 4 mg (not administered)  sodium chloride 0.9 % bolus 1,000 mL (0 mLs Intravenous Stopped 02/06/16 1549)  ondansetron (ZOFRAN)  injection 4 mg (4 mg Intravenous Given 02/06/16 1427)  0.9 %  sodium chloride infusion ( Intravenous New Bag/Given 02/06/16 1546)  loperamide (IMODIUM) capsule 4 mg (4 mg Oral Given 02/06/16 1608)  0.9 %  sodium chloride infusion ( Intravenous New Bag/Given 02/06/16 1609)     Initial Impression / Assessment and Plan / ED Course  I have reviewed the triage vital signs and the nursing notes.  Pertinent labs & imaging results that were available during my care of the patient were reviewed by me and considered in my medical decision making (see chart for details).  Clinical Course as of Feb 06 1820  Ludwig Clarks Feb 06, 2016  6067 Creatinine: (!) 2.32 [EW]    Clinical Course User Index [EW] Clayton Bibles, PA-C   Afebrile nontoxic patient with N/V/D since 1am today.  Is orthostatic with BP dropping from 100/58 to 58/44 and pulse increasing from 64 to 93 from lying to standing.  This is after 1L IVF.  She was given second liter IVF and continued to have soft BP.  Labs demonstrate AKI with creatinine increase from 1.0 to 2.3.  K is normal.  Pt admitted to [facility] for continued hydration.  Dr Nehemiah Settle accepting.    Final Clinical Impressions(s) / ED Diagnoses   Final diagnoses:  Syncope, unspecified syncope type  Nausea vomiting and diarrhea  AKI (acute kidney injury) ([place])  Orthostatic hypotension    New Prescriptions New Prescriptions   No medications on file     Clayton Bibles, PA-C 02/06/16 1822    Charlesetta Shanks, MD 02/10/16 0030

## 2016-02-06 NOTE — ED Triage Notes (Addendum)
Per [facility], Pt is coming from home with complaints of near-syncope this afternoon after a syncopal episode in the bathroom this morning. Pt reports having N/V/D that started this morning around 0130. Pt was with family when she passed out in the bathroom. EMS evaluated and gave patient 500 cc of fluid. Pt decided not to come to hospital. Pt reports having weakness to continue through the day and had a near-syncopal episode this afternoon. Family called EMS. EMS gave 500cc of fluid. Pt was positive for orthostatics.

## 2016-02-06 NOTE — ED Notes (Signed)
Pt aware of need for urine  

## 2016-02-07 DIAGNOSIS — E860 Dehydration: Secondary | ICD-10-CM | POA: Diagnosis not present

## 2016-02-07 DIAGNOSIS — N179 Acute kidney failure, unspecified: Secondary | ICD-10-CM | POA: Diagnosis not present

## 2016-02-07 DIAGNOSIS — A09 Infectious gastroenteritis and colitis, unspecified: Secondary | ICD-10-CM | POA: Diagnosis not present

## 2016-02-07 DIAGNOSIS — E861 Hypovolemia: Secondary | ICD-10-CM | POA: Diagnosis not present

## 2016-02-07 DIAGNOSIS — M810 Age-related osteoporosis without current pathological fracture: Secondary | ICD-10-CM | POA: Diagnosis not present

## 2016-02-07 DIAGNOSIS — Z932 Ileostomy status: Secondary | ICD-10-CM | POA: Diagnosis not present

## 2016-02-07 DIAGNOSIS — I341 Nonrheumatic mitral (valve) prolapse: Secondary | ICD-10-CM | POA: Diagnosis not present

## 2016-02-07 DIAGNOSIS — Z85038 Personal history of other malignant neoplasm of large intestine: Secondary | ICD-10-CM | POA: Diagnosis not present

## 2016-02-07 DIAGNOSIS — I4891 Unspecified atrial fibrillation: Secondary | ICD-10-CM | POA: Diagnosis not present

## 2016-02-07 DIAGNOSIS — J45909 Unspecified asthma, uncomplicated: Secondary | ICD-10-CM | POA: Diagnosis not present

## 2016-02-07 DIAGNOSIS — Z79899 Other long term (current) drug therapy: Secondary | ICD-10-CM | POA: Diagnosis not present

## 2016-02-07 DIAGNOSIS — E785 Hyperlipidemia, unspecified: Secondary | ICD-10-CM

## 2016-02-07 DIAGNOSIS — Z853 Personal history of malignant neoplasm of breast: Secondary | ICD-10-CM | POA: Diagnosis not present

## 2016-02-07 DIAGNOSIS — A0811 Acute gastroenteropathy due to Norwalk agent: Secondary | ICD-10-CM | POA: Diagnosis not present

## 2016-02-07 DIAGNOSIS — E039 Hypothyroidism, unspecified: Secondary | ICD-10-CM | POA: Diagnosis not present

## 2016-02-07 DIAGNOSIS — Z7902 Long term (current) use of antithrombotics/antiplatelets: Secondary | ICD-10-CM | POA: Diagnosis not present

## 2016-02-07 DIAGNOSIS — Z9049 Acquired absence of other specified parts of digestive tract: Secondary | ICD-10-CM | POA: Diagnosis not present

## 2016-02-07 DIAGNOSIS — E7800 Pure hypercholesterolemia, unspecified: Secondary | ICD-10-CM | POA: Diagnosis not present

## 2016-02-07 DIAGNOSIS — I951 Orthostatic hypotension: Secondary | ICD-10-CM | POA: Diagnosis present

## 2016-02-07 LAB — CBC
HCT: 38.3 % (ref 36.0–46.0)
HEMOGLOBIN: 12.3 g/dL (ref 12.0–15.0)
MCH: 30 pg (ref 26.0–34.0)
MCHC: 32.1 g/dL (ref 30.0–36.0)
MCV: 93.4 fL (ref 78.0–100.0)
Platelets: 182 10*3/uL (ref 150–400)
RBC: 4.1 MIL/uL (ref 3.87–5.11)
RDW: 13.6 % (ref 11.5–15.5)
WBC: 8.9 10*3/uL (ref 4.0–10.5)

## 2016-02-07 LAB — GASTROINTESTINAL PANEL BY PCR, STOOL (REPLACES STOOL CULTURE)

## 2016-02-07 LAB — BASIC METABOLIC PANEL
Anion gap: 8 (ref 5–15)
BUN: 33 mg/dL — ABNORMAL HIGH (ref 6–20)
CALCIUM: 8.8 mg/dL — AB (ref 8.9–10.3)
CHLORIDE: 107 mmol/L (ref 101–111)
CO2: 20 mmol/L — AB (ref 22–32)
CREATININE: 2.08 mg/dL — AB (ref 0.44–1.00)
GFR, EST AFRICAN AMERICAN: 25 mL/min — AB (ref >60)
GFR, EST NON AFRICAN AMERICAN: 22 mL/min — AB (ref >60)
Glucose, Bld: 115 mg/dL — ABNORMAL HIGH (ref 65–99)
Potassium: 3.7 mmol/L (ref 3.5–5.1)
Sodium: 135 mmol/L (ref 135–145)

## 2016-02-07 MED ORDER — ENOXAPARIN SODIUM 30 MG/0.3ML ~~LOC~~ SOLN
30.0000 mg | SUBCUTANEOUS | Status: DC
  Administered 2016-02-07 – 2016-02-08 (×2): 30 mg via SUBCUTANEOUS
  Filled 2016-02-07 (×2): qty 0.3

## 2016-02-07 MED ORDER — ALBUTEROL SULFATE (2.5 MG/3ML) 0.083% IN NEBU: 2.5000 mg | INHALATION_SOLUTION | RESPIRATORY_TRACT | Status: DC | PRN

## 2016-02-07 NOTE — Progress Notes (Signed)
Noro virus GI/GIR detected.  Called in by lab.  Clarified result with Magda Paganini and Nevin Bloodgood in lab. Notified on call Dr. Ara Kussmaul and on  coming nurse Ray, made aware.   Kahlel Peake,RN.

## 2016-02-07 NOTE — Evaluation (Signed)
Physical Therapy Evaluation Patient Details Name: SAMIE BARCLIFT MRN: [id number] DOB: 02-Jul-1935 Today's Date: 02/07/2016   History of Present Illness  Pt is a 81 y/o female admitted secondary to diarrhea, n/v, fever and syncope. PMH including but not limited to A-fib, breast cancer and colon cancer with hx of osteotomy.  Clinical Impression  Pt presented supine in bed with HOB elevated, awake and willing to participate in therapy session. Prior to admission, pt reported that she was independent with all functional mobility and ADLs. Pt currently performing all functional mobility at supervision to min guard level for safety only, no physical assistance needed. Pt with mild dizziness upon standing that subsided quickly. Orthostatic vitals were taken (please see below). Pt would continue to benefit from skilled physical therapy services at this time while admitted to address her below listed limitations in order to improve her overall safety and independence with functional mobility.      Follow Up Recommendations No PT follow up    Equipment Recommendations  None recommended by PT    Recommendations for Other Services       Precautions / Restrictions Precautions Precautions: Fall Restrictions Weight Bearing Restrictions: No      Mobility  Bed Mobility Overal bed mobility: Needs Assistance Bed Mobility: Supine to Sit;Sit to Supine     Supine to sit: Supervision Sit to supine: Supervision   General bed mobility comments: increased time, supervision for safety  Transfers Overall transfer level: Needs assistance Equipment used: None Transfers: Sit to/from Stand Sit to Stand: Min guard         General transfer comment: increased time, min guard for safety  Ambulation/Gait Ambulation/Gait assistance: Min guard Ambulation Distance (Feet): 25 Feet Assistive device: None Gait Pattern/deviations: Step-through pattern;Decreased stride length Gait velocity: decreased    General Gait Details: no instability or LOB, min guard for safety  Stairs            Wheelchair Mobility    Modified Rankin (Stroke Patients Only)       Balance Overall balance assessment: Needs assistance Sitting-balance support: Feet supported;No upper extremity supported Sitting balance-Leahy Scale: Good     Standing balance support: During functional activity;No upper extremity supported Standing balance-Leahy Scale: Fair                               Pertinent Vitals/Pain Pain Assessment: No/denies pain   BP Supine = 110/73 mmHg BP Sitting = 95/65 mmHg BP Standing = 73/55 mmHg    Home Living Family/patient expects to be discharged to:: Private residence Living Arrangements: Spouse/significant other Available Help at Discharge: Family;Available 24 hours/day   Home Access: Stairs to enter Entrance Stairs-Rails: None Entrance Stairs-Number of Steps: 2 Home Layout: One level Home Equipment: None      Prior Function Level of Independence: Independent               Hand Dominance        Extremity/Trunk Assessment   Upper Extremity Assessment Upper Extremity Assessment: Overall WFL for tasks assessed    Lower Extremity Assessment Lower Extremity Assessment: Overall WFL for tasks assessed    Cervical / Trunk Assessment Cervical / Trunk Assessment: Normal  Communication   Communication: No difficulties  Cognition Arousal/Alertness: Awake/alert Behavior During Therapy: WFL for tasks assessed/performed Overall Cognitive Status: Within Functional Limits for tasks assessed  General Comments      Exercises     Assessment/Plan    PT Assessment Patient needs continued PT services  PT Problem List Decreased balance;Decreased mobility;Decreased coordination;Decreased safety awareness          PT Treatment Interventions DME instruction;Gait training;Stair training;Functional mobility  training;Therapeutic activities;Therapeutic exercise;Balance training;Neuromuscular re-education;Patient/family education    PT Goals (Current goals can be found in the Care Plan section)  Acute Rehab PT Goals Patient Stated Goal: return home PT Goal Formulation: With patient Time For Goal Achievement: 02/21/16 Potential to Achieve Goals: Good    Frequency Min 3X/week   Barriers to discharge        Co-evaluation               End of Session   Activity Tolerance: Patient tolerated treatment well Patient left: in bed;with call bell/phone within reach;with bed alarm set Nurse Communication: Mobility status    Functional Assessment Tool Used: clinical judgement Functional Limitation: Mobility: Walking and moving around Mobility: Walking and Moving Around Current Status (X5056): At least 1 percent but less than 20 percent impaired, limited or restricted Mobility: Walking and Moving Around Goal Status [phone]): 0 percent impaired, limited or restricted    Time: 1015-1031 PT Time Calculation (min) (ACUTE ONLY): 16 min   Charges:   PT Evaluation $PT Eval Low Complexity: 1 Procedure     PT G Codes:   PT G-Codes **NOT FOR INPATIENT CLASS** Functional Assessment Tool Used: clinical judgement Functional Limitation: Mobility: Walking and moving around Mobility: Walking and Moving Around Current Status (A1655): At least 1 percent but less than 20 percent impaired, limited or restricted Mobility: Walking and Moving Around Goal Status [phone]): 0 percent impaired, limited or restricted    [facility] 02/07/2016, 1:22 PM Sherie Don, [place], DPT [phone]

## 2016-02-07 NOTE — Progress Notes (Addendum)
PROGRESS NOTE        PATIENT DETAILS Name: Jocelyn Moss Age: 81 y.o. Sex: female Date of Birth: 29-Mar-1935 Admit Date: 02/06/2016 Admitting Physician Truett Mainland, DO RJJ:[id number],YTKZSW TOM, MD  Brief Narrative: Patient is a 81 y.o. female with history of colectomy-status post ileostomy (history of colon cancer), dyslipidemia, hypothyroidism who presented for evaluation of 3 syncopal episodes in a setting of increased output from her ileostomy. She acknowledges exposure to sick contacts (spouse had viral syndrome a few days back)  Subjective: Better-does not feel dizzy when she stands up. Continues to have more ileostomy output than her usual baseline.  Assessment/Plan: Syncope: Clearly secondary to orthostatic hypotension-in a setting of hypovolemia due to excessive ileostomy output. Continues to be orthostatic this morning-but significantly less symptomatic. Plans are to continue telemetry monitoring (negative for arrhythmias), ambulate--if ileostomy output decreases further-I suspect she will be stable for discharge on 1/14.  Increased ileostomy output: I suspect this is due to a viral syndrome-spouse had similar symptoms a few days back. Awaiting stool GI pathogen panel. Claims that her output is now slowing down-plans are to continue with supportive measures. Do not think antimicrobial agents are indicated in this setting.  Acute kidney injury: Likely hemodynamically mediated in the setting of dehydration/increased ostomy output. Creatinine improving, follow.  Dehydration: Resolved. Secondary to increased GI loss/ileostomy output. Clinically improving-no signs of dehydration on my exam this morning. Decrease IV fluids. Follow  Hypothyroidism: Continue Synthroid  Dyslipidemia: Continue statin  History of asthma: Stable-clear lungs-Continue Dulera  History of colon cancer-status post ileostomy - 2000  DVT Prophylaxis: Prophylactic Lovenox   Code  Status: Full code   Family Communication: None at bedside  Disposition Plan: Remain home likely on 1/14  Antimicrobial agents: Anti-infectives    None     Procedures: None   CONSULTS:  None  Time spent: 25- minutes-Greater than 50% of this time was spent in counseling, explanation of diagnosis, planning of further management, and coordination of care.  MEDICATIONS: Scheduled Meds: . atorvastatin  20 mg Oral QHS  . clopidogrel  75 mg Oral Daily  . enoxaparin (LOVENOX) injection  30 mg Subcutaneous Q24H  . levothyroxine  100 mcg Oral QODAY  . [START ON 02/08/2016] levothyroxine  112 mcg Oral QODAY  . mometasone-formoterol  2 puff Inhalation BID  . sodium chloride flush  3 mL Intravenous Q12H   Continuous Infusions: . sodium chloride 125 mL/hr at 02/07/16 0501   PRN Meds:.acetaminophen **OR** acetaminophen, alum & mag hydroxide-simeth, loperamide, ondansetron **OR** ondansetron (ZOFRAN) IV   PHYSICAL EXAM: Vital signs: Vitals:   02/06/16 2033 02/07/16 0532 02/07/16 0757 02/07/16 0806  BP: (!) 108/50     Pulse: 69     Resp: 20 20    Temp: 98.6 F (37 C) 97.9 F (36.6 C)    TempSrc: Oral Oral    SpO2: 98% 96% 97% 100%  Weight: 69.4 kg (153 lb) 69 kg (152 lb 1.6 oz)    Height: '5\' 7"'$  (1.702 m)      Filed Weights   02/06/16 1403 02/06/16 2033 02/07/16 0532  Weight: 70.3 kg (155 lb) 69.4 kg (153 lb) 69 kg (152 lb 1.6 oz)   Body mass index is 23.82 kg/m.   General appearance :Awake, alert, not in any distress. Speech Clear. Not toxic Looking Eyes:, pupils equally reactive to light and accomodation,no  scleral icterus.Pink conjunctiva HEENT: Atraumatic and Normocephalic Neck: supple, no JVD. No cervical lymphadenopathy. No thyromegaly Resp:Good air entry bilaterally, no added sounds  CVS: S1 S2 regular, no murmurs.  GI: Bowel sounds present, Non tender and not distended with no gaurding, rigidity or rebound.No organomegaly. Ileostomy in place-liquid stools in  back. Extremities: B/L Lower Ext shows no edema, both legs are warm to touch Neurology:  speech clear,Non focal, sensation is grossly intact. Psychiatric: Normal judgment and insight. Alert and oriented x 3. Normal mood. Musculoskeletal:No digital cyanosis Skin:No Rash, warm and dry Wounds:N/A  I have personally reviewed following labs and imaging studies  LABORATORY DATA: CBC:  Recent Labs Lab 02/06/16 1438 02/07/16 0535  WBC 16.1* 8.9  HGB 12.9 12.3  HCT 39.8 38.3  MCV 93.6 93.4  PLT 194 093    Basic Metabolic Panel:  Recent Labs Lab 02/06/16 1432 02/07/16 0535  NA 132* 135  K 4.4 3.7  CL 103 107  CO2 20* 20*  GLUCOSE 152* 115*  BUN 33* 33*  CREATININE 2.32* 2.08*  CALCIUM 8.9 8.8*    GFR: Estimated Creatinine Clearance: 21.7 mL/min (by C-G formula based on SCr of 2.08 mg/dL (H)).  Liver Function Tests:  Recent Labs Lab 02/06/16 1432  AST 28  ALT 19  ALKPHOS 96  BILITOT 1.2  PROT 7.5  ALBUMIN 3.5    Recent Labs Lab 02/06/16 1432  LIPASE 26   No results for input(s): AMMONIA in the last 168 hours.  Coagulation Profile: No results for input(s): INR, PROTIME in the last 168 hours.  Cardiac Enzymes: No results for input(s): CKTOTAL, CKMB, CKMBINDEX, TROPONINI in the last 168 hours.  BNP (last 3 results) No results for input(s): PROBNP in the last 8760 hours.  HbA1C: No results for input(s): HGBA1C in the last 72 hours.  CBG:  Recent Labs Lab 02/06/16 1416  GLUCAP 137*    Lipid Profile: No results for input(s): CHOL, HDL, LDLCALC, TRIG, CHOLHDL, LDLDIRECT in the last 72 hours.  Thyroid Function Tests: No results for input(s): TSH, T4TOTAL, FREET4, T3FREE, THYROIDAB in the last 72 hours.  Anemia Panel: No results for input(s): VITAMINB12, FOLATE, FERRITIN, TIBC, IRON, RETICCTPCT in the last 72 hours.  Urine analysis:    Component Value Date/Time   COLORURINE AMBER (A) 02/06/2016 1634   APPEARANCEUR HAZY (A) 02/06/2016 1634    LABSPEC 1.019 02/06/2016 1634   PHURINE 5.0 02/06/2016 1634   GLUCOSEU NEGATIVE 02/06/2016 1634   HGBUR NEGATIVE 02/06/2016 1634   BILIRUBINUR NEGATIVE 02/06/2016 [place] 02/06/2016 1634   PROTEINUR 30 (A) 02/06/2016 1634   UROBILINOGEN 1 12/24/2013 1010   NITRITE NEGATIVE 02/06/2016 1634   LEUKOCYTESUR LARGE (A) 02/06/2016 1634    Sepsis Labs: Lactic Acid, Venous No results found for: LATICACIDVEN  MICROBIOLOGY: No results found for this or any previous visit (from the past 240 hour(s)).  RADIOLOGY STUDIES/RESULTS: No results found.   LOS: 0 days   Oren Binet, MD  [facility] Pager:[phone]  If 7PM-7AM, please contact night-coverage [email or web address] Password [facility] 02/07/2016, 10:38 AM

## 2016-02-07 NOTE — Progress Notes (Signed)
[place]. Mid-level paged and called back immediately regarding orthostatic bp standing 64/42; while laying pt is asymptomatic and bp is is 97/57. After reviewing amount of fluids received over the last 12 hours (2+ liters); will continue to give 125 ml/hr NSS as per previous order and will continue to monitor patient.

## 2016-02-08 DIAGNOSIS — N179 Acute kidney failure, unspecified: Secondary | ICD-10-CM | POA: Diagnosis not present

## 2016-02-08 DIAGNOSIS — I951 Orthostatic hypotension: Secondary | ICD-10-CM | POA: Diagnosis not present

## 2016-02-08 DIAGNOSIS — E860 Dehydration: Secondary | ICD-10-CM | POA: Diagnosis not present

## 2016-02-08 DIAGNOSIS — A09 Infectious gastroenteritis and colitis, unspecified: Secondary | ICD-10-CM | POA: Diagnosis not present

## 2016-02-08 LAB — BASIC METABOLIC PANEL
Anion gap: 8 (ref 5–15)
BUN: 26 mg/dL — AB (ref 6–20)
CHLORIDE: 110 mmol/L (ref 101–111)
CO2: 19 mmol/L — AB (ref 22–32)
CREATININE: 1.27 mg/dL — AB (ref 0.44–1.00)
Calcium: 8.4 mg/dL — ABNORMAL LOW (ref 8.9–10.3)
GFR calc Af Amer: 46 mL/min — ABNORMAL LOW (ref >60)
GFR calc non Af Amer: 39 mL/min — ABNORMAL LOW (ref >60)
GLUCOSE: 106 mg/dL — AB (ref 65–99)
Potassium: 3.2 mmol/L — ABNORMAL LOW (ref 3.5–5.1)
SODIUM: 137 mmol/L (ref 135–145)

## 2016-02-08 MED ORDER — LOPERAMIDE HCL 2 MG PO CAPS: 4.0000 mg | ORAL_CAPSULE | ORAL | 0 refills | Status: DC | PRN

## 2016-02-08 MED ORDER — POTASSIUM CHLORIDE CRYS ER 20 MEQ PO TBCR
40.0000 meq | EXTENDED_RELEASE_TABLET | Freq: Once | ORAL | Status: AC
  Administered 2016-02-08: 40 meq via ORAL
  Filled 2016-02-08: qty 2

## 2016-02-08 NOTE — Progress Notes (Signed)
All d/c instructions explained and given to pt.  Verbalized understanding. .  Stephanie Mcglone, RN. 

## 2016-02-08 NOTE — Discharge Instructions (Signed)
Diarrhea, Adult Introduction Diarrhea is when you have loose and water poop (stool) often. Diarrhea can make you feel weak and cause you to get dehydrated. Dehydration can make you tired and thirsty, make you have a dry mouth, and make it so you pee (urinate) less often. Diarrhea often lasts 2-3 days. However, it can last longer if it is a sign of something more serious. It is important to treat your diarrhea as told by your doctor. Follow these instructions at home: Eating and drinking Follow these recommendations as told by your doctor:  Take an oral rehydration solution (ORS). This is a drink that is sold at pharmacies and stores.  Drink clear fluids, such as:  Water.  Ice chips.  Diluted fruit juice.  Low-calorie sports drinks.  Eat bland, easy-to-digest foods in small amounts as you are able. These foods include:  Bananas.  Applesauce.  Rice.  Low-fat (lean) meats.  Toast.  Crackers.  Avoid drinking fluids that have a lot of sugar or caffeine in them.  Avoid alcohol.  Avoid spicy or fatty foods. General instructions  Drink enough fluid to keep your pee (urine) clear or pale yellow.  Wash your hands often. If you cannot use soap and water, use hand sanitizer.  Make sure that all people in your home wash their hands well and often.  Take over-the-counter and prescription medicines only as told by your doctor.  Rest at home while you get better.  Watch your condition for any changes.  Take a warm bath to help with any burning or pain from having diarrhea.  Keep all follow-up visits as told by your doctor. This is important. Contact a doctor if:  You have a fever.  Your diarrhea gets worse.  You have new symptoms.  You cannot keep fluids down.  You feel light-headed or dizzy.  You have a headache.  You have muscle cramps. Get help right away if:  You have chest pain.  You feel very weak or you pass out (faint).  You have bloody or black  poop or poop that look like tar.  You have very bad pain, cramping, or bloating in your belly (abdomen).  You have trouble breathing or you are breathing very quickly.  Your heart is beating very quickly.  Your skin feels cold and clammy.  You feel confused.  You have signs of dehydration, such as:  Dark pee, hardly any pee, or no pee.  Cracked lips.  Dry mouth.  Sunken eyes.  Sleepiness.  Weakness. This information is not intended to replace advice given to you by your health care provider. Make sure you discuss any questions you have with your health care provider. Document Released: 06/30/2007 Document Revised: 08/01/2015 Document Reviewed: 09/17/2014  2017 Elsevier

## 2016-02-08 NOTE — Progress Notes (Signed)
Feels much better today Ambulating to the bathroom without any dizziness. Creatinine has improved significantly, stool GI pathogen panel positive for norvo virus Stool output only 600 mL in ostomy since yesterday-per patient, although still lose-stools now getting a little bit more formed. She is requesting discharge-thinks she will be able to manage at home. Have asked her to follow-up with her PCP in a few days for a chemistry panel checked.

## 2016-02-08 NOTE — Discharge Summary (Signed)
PATIENT DETAILS Name: Jocelyn Moss Age: 81 y.o. Sex: female Date of Birth: 1935/02/14 MRN: [id number]. Admitting Physician: Truett Mainland, DO JME:[id number] TOM, MD  Admit Date: 02/06/2016 Discharge date: 02/08/2016  Recommendations for Outpatient Follow-up:  1. Follow up with PCP in 2-3 days for a chemistry panel check   Admitted From:  Home  Disposition: [place]: No  Equipment/Devices: None  Discharge Condition: Stable  CODE STATUS: FULL CODE  Diet recommendation:  Heart Healthy  Brief Summary: See H&P, Labs, Consult and Test reports for all details in brief, Patient is a 81 y.o. female with history of colectomy-status post ileostomy (history of colon cancer), dyslipidemia, hypothyroidism who presented for evaluation of 3 syncopal episodes in a setting of increased output from her ileostomy. She acknowledges exposure to sick contacts (spouse had viral syndrome a few days back)  Brief Hospital Course: Syncope: Clearly secondary to orthostatic hypotension-in a setting of hypovolemia due to excessive ileostomy output. She continues to improve with decreasing ostomy output. She no longer is dizzy when she stands up. She is able to ambulate in the room without any major issues. She emigrated with physical therapy yesterday without any major issues. Since this was in a setting of hypovolemia and orthostatic hypotension, doubt any further workup is required.  Increased ileostomy output:  secondary to norvo virus-apparently her spouse had similar symptoms a few days back. Ostomy output in the past day only around 600 mL. Although still with loose stools, this is much improved than just a few days back. Have advised that she continue to stay hydrated, and use as needed Imodium. She should continue to improve in the next few days. She is requesting discharge, and feels that she can manage herself at home. I have asked her to stay well hydrated, and get a  chemistry panel checked in the next 2-3 days. She has been asked to return to the emergency room if her stool output increases or she starts feeling dizzy again.  Acute kidney injury: Likely hemodynamically mediated in the setting of dehydration/increased ostomy output. Creatinine significantly improved, and should continue to improve as stool ostomy output decreases. Recheck chemistry panel in the next 2-3 days at PCPs office.  Dehydration: Resolved-she is euvolemic on my exam today. Secondary to increased GI loss/ileostomy output.   Hypothyroidism: Continue Synthroid  Dyslipidemia: Continue statin  History of asthma: Stable-clear lungs-Continue Dulera  History of colon cancer-status post ileostomy - 2000  Procedures/Studies: None  Discharge Diagnoses:  Principal Problem:   Syncope due to orthostatic hypotension Active Problems:   AKI (acute kidney injury) ([place])   Orthostasis   Dehydration   Diarrhea   Discharge Instructions:  Activity:  As tolerated with Full fall precautions use walker/cane & assistance as needed  Discharge Instructions    Call MD for:    Complete by:  As directed    If the output from the ostomy increases significantly   Call MD for:  persistant dizziness or light-headedness    Complete by:  As directed    Diet - low sodium heart healthy    Complete by:  As directed    Discharge instructions    Complete by:  As directed    Follow with Primary MD  Jenna Luo TOM, MD  In 2-3 days for a BMET check  Please get a complete blood count and chemistry panel checked by your Primary MD at your next visit, and again as instructed by your Primary MD.  Get Medicines  reviewed and adjusted: Please take all your medications with you for your next visit with your Primary MD  Laboratory/radiological data: Please request your Primary MD to go over all hospital tests and procedure/radiological results at the follow up, please ask your Primary MD to get all  Hospital records sent to his/her office.  In some cases, they will be blood work, cultures and biopsy results pending at the time of your discharge. Please request that your primary care M.D. follows up on these results.  Also Note the following: If you experience worsening of your admission symptoms, develop shortness of breath, life threatening emergency, suicidal or homicidal thoughts you must seek medical attention immediately by calling 911 or calling your MD immediately  if symptoms less severe.  You must read complete instructions/literature along with all the possible adverse reactions/side effects for all the Medicines you take and that have been prescribed to you. Take any new Medicines after you have completely understood and accpet all the possible adverse reactions/side effects.   Do not drive when taking Pain medications or sleeping medications (Benzodaizepines)  Do not take more than prescribed Pain, Sleep and Anxiety Medications. It is not advisable to combine anxiety,sleep and pain medications without talking with your primary care practitioner  Special Instructions: If you have smoked or chewed Tobacco  in the last 2 yrs please stop smoking, stop any regular Alcohol  and or any Recreational drug use.  Wear Seat belts while driving.  Please note: You were cared for by a hospitalist during your hospital stay. Once you are discharged, your primary care physician will handle any further medical issues. Please note that NO REFILLS for any discharge medications will be authorized once you are discharged, as it is imperative that you return to your primary care physician (or establish a relationship with a primary care physician if you do not have one) for your post hospital discharge needs so that they can reassess your need for medications and monitor your lab values.   Increase activity slowly    Complete by:  As directed      Allergies as of 02/08/2016      Reactions   Crestor  [rosuvastatin Calcium] Other (See Comments)   Joint pain, effected liver function   Rosuvastatin Other (See Comments)   Joint pain, effected liver function   Zetia [ezetimibe] Other (See Comments)   Joint pain, effected liver function      Medication List    TAKE these medications   atorvastatin 20 MG tablet Commonly known as:  LIPITOR Take 1 tablet (20 mg total) by mouth daily.   clopidogrel 75 MG tablet Commonly known as:  PLAVIX Take 1 tablet (75 mg total) by mouth daily.   levothyroxine 112 MCG tablet Commonly known as:  SYNTHROID, LEVOTHROID TAKE ONE TABLET BY MOUTH EVERY OTHER DAY   levothyroxine 100 MCG tablet Commonly known as:  SYNTHROID, LEVOTHROID TAKE ONE TABLET BY MOUTH EVERY OTHER DAY   loperamide 2 MG capsule Commonly known as:  IMODIUM Take 2 capsules (4 mg total) by mouth as needed for diarrhea or loose stools. DO NOT TAKE MORE THAN '16mg'$  (4 tablets) per day   metoprolol tartrate 25 MG tablet Commonly known as:  LOPRESSOR TAKE ONE TABLET BY MOUTH TWICE DAILY   mometasone-formoterol 100-5 MCG/ACT Aero Commonly known as:  DULERA Inhale 2 puffs into the lungs 2 (two) times daily.       Allergies  Allergen Reactions  . Crestor [Rosuvastatin Calcium] Other (See Comments)  Joint pain, effected liver function   . Rosuvastatin Other (See Comments)    Joint pain, effected liver function  . Zetia [Ezetimibe] Other (See Comments)    Joint pain, effected liver function    Consultations:   None   Other Procedures/Studies:  No results found.   TODAY-DAY OF DISCHARGE:  Subjective:   Kaicee Scarpino today has no headache,no chest abdominal pain,no new weakness tingling or numbness, feels much better wants to go home today.   Objective:   Blood pressure 130/73, pulse 68, temperature 97.5 F (36.4 C), temperature source Oral, resp. rate 18, height '5\' 7"'$  (1.702 m), weight 68.3 kg (150 lb 9.6 oz), SpO2 98 %.  Intake/Output Summary (Last 24 hours)  at 02/08/16 0822 Last data filed at 02/08/16 0413  Gross per 24 hour  Intake             1776 ml  Output             1950 ml  Net             -174 ml   Filed Weights   02/06/16 2033 02/07/16 0532 02/08/16 0412  Weight: 69.4 kg (153 lb) 69 kg (152 lb 1.6 oz) 68.3 kg (150 lb 9.6 oz)    Exam: Awake Alert, Oriented *3, No new F.N deficits, Normal affect [place].AT,PERRAL Supple Neck,No JVD, No cervical lymphadenopathy appriciated.  Symmetrical Chest wall movement, Good air movement bilaterally, CTAB RRR,No Gallops,Rubs or new Murmurs, No Parasternal Heave +ve B.Sounds, Abd Soft, Non tender, No organomegaly appriciated, No rebound -guarding or rigidity. No Cyanosis, Clubbing or edema, No new Rash or bruise   PERTINENT RADIOLOGIC STUDIES: No results found.   PERTINENT LAB RESULTS: CBC:  Recent Labs  02/06/16 1438 02/07/16 0535  WBC 16.1* 8.9  HGB 12.9 12.3  HCT 39.8 38.3  PLT 194 182   CMET CMP     Component Value Date/Time   NA 137 02/08/2016 0415   K 3.2 (L) 02/08/2016 0415   CL 110 02/08/2016 0415   CO2 19 (L) 02/08/2016 0415   GLUCOSE 106 (H) 02/08/2016 0415   BUN 26 (H) 02/08/2016 0415   CREATININE 1.27 (H) 02/08/2016 0415   CREATININE 1.03 (H) 01/02/2016 0837   CALCIUM 8.4 (L) 02/08/2016 0415   PROT 7.5 02/06/2016 1432   ALBUMIN 3.5 02/06/2016 1432   AST 28 02/06/2016 1432   ALT 19 02/06/2016 1432   ALKPHOS 96 02/06/2016 1432   BILITOT 1.2 02/06/2016 1432   GFRNONAA 39 (L) 02/08/2016 0415   GFRNONAA 52 (L) 01/02/2016 0837   GFRAA 46 (L) 02/08/2016 0415   GFRAA 60 01/02/2016 0837    GFR Estimated Creatinine Clearance: 35.5 mL/min (by C-G formula based on SCr of 1.27 mg/dL (H)).  Recent Labs  02/06/16 1432  LIPASE 26   No results for input(s): CKTOTAL, CKMB, CKMBINDEX, TROPONINI in the last 72 hours. Invalid input(s): POCBNP No results for input(s): DDIMER in the last 72 hours. No results for input(s): HGBA1C in the last 72 hours. No results for  input(s): CHOL, HDL, LDLCALC, TRIG, CHOLHDL, LDLDIRECT in the last 72 hours. No results for input(s): TSH, T4TOTAL, T3FREE, THYROIDAB in the last 72 hours.  Invalid input(s): FREET3 No results for input(s): VITAMINB12, FOLATE, FERRITIN, TIBC, IRON, RETICCTPCT in the last 72 hours. Coags: No results for input(s): INR in the last 72 hours.  Invalid input(s): PT Microbiology: Recent Results (from the past 240 hour(s))  Gastrointestinal Panel by PCR , Stool  Status: Abnormal   Collection Time: 02/07/16  6:30 AM  Result Value Ref Range Status   Campylobacter species NOT DETECTED NOT DETECTED Final   Plesimonas shigelloides NOT DETECTED NOT DETECTED Final   Salmonella species NOT DETECTED NOT DETECTED Final   Yersinia enterocolitica NOT DETECTED NOT DETECTED Final   Vibrio species NOT DETECTED NOT DETECTED Final   Vibrio cholerae NOT DETECTED NOT DETECTED Final   Enteroaggregative E coli (EAEC) NOT DETECTED NOT DETECTED Final   Enteropathogenic E coli (EPEC) NOT DETECTED NOT DETECTED Final   Enterotoxigenic E coli (ETEC) NOT DETECTED NOT DETECTED Final   Shiga like toxin producing E coli (STEC) NOT DETECTED NOT DETECTED Final   Shigella/Enteroinvasive E coli (EIEC) NOT DETECTED NOT DETECTED Final   Cryptosporidium NOT DETECTED NOT DETECTED Final   Cyclospora cayetanensis NOT DETECTED NOT DETECTED Final   Entamoeba histolytica NOT DETECTED NOT DETECTED Final   Giardia lamblia NOT DETECTED NOT DETECTED Final   Adenovirus F40/41 NOT DETECTED NOT DETECTED Final   Astrovirus NOT DETECTED NOT DETECTED Final   Norovirus GI/GII DETECTED (A) NOT DETECTED Final    Comment: RESULT CALLED TO, READ BACK BY AND VERIFIED WITH:  NELLIE BUCK AT 1909 02/07/16 SDR    Rotavirus A NOT DETECTED NOT DETECTED Final   Sapovirus (I, II, IV, and V) NOT DETECTED NOT DETECTED Final    FURTHER DISCHARGE INSTRUCTIONS:  Get Medicines reviewed and adjusted: Please take all your medications with you for your  next visit with your Primary MD  Laboratory/radiological data: Please request your Primary MD to go over all hospital tests and procedure/radiological results at the follow up, please ask your Primary MD to get all Hospital records sent to his/her office.  In some cases, they will be blood work, cultures and biopsy results pending at the time of your discharge. Please request that your primary care M.D. goes through all the records of your hospital data and follows up on these results.  Also Note the following: If you experience worsening of your admission symptoms, develop shortness of breath, life threatening emergency, suicidal or homicidal thoughts you must seek medical attention immediately by calling 911 or calling your MD immediately  if symptoms less severe.  You must read complete instructions/literature along with all the possible adverse reactions/side effects for all the Medicines you take and that have been prescribed to you. Take any new Medicines after you have completely understood and accpet all the possible adverse reactions/side effects.   Do not drive when taking Pain medications or sleeping medications (Benzodaizepines)  Do not take more than prescribed Pain, Sleep and Anxiety Medications. It is not advisable to combine anxiety,sleep and pain medications without talking with your primary care practitioner  Special Instructions: If you have smoked or chewed Tobacco  in the last 2 yrs please stop smoking, stop any regular Alcohol  and or any Recreational drug use.  Wear Seat belts while driving.  Please note: You were cared for by a hospitalist during your hospital stay. Once you are discharged, your primary care physician will handle any further medical issues. Please note that NO REFILLS for any discharge medications will be authorized once you are discharged, as it is imperative that you return to your primary care physician (or establish a relationship with a primary care  physician if you do not have one) for your post hospital discharge needs so that they can reassess your need for medications and monitor your lab values.  Total Time spent coordinating discharge  including counseling, education and face to face time equals 45 minutes.  SignedOren Binet 02/08/2016 8:22 AM

## 2016-02-12 ENCOUNTER — Inpatient Hospital Stay: Payer: Medicare Other | Admitting: Family Medicine

## 2016-02-16 ENCOUNTER — Ambulatory Visit (INDEPENDENT_AMBULATORY_CARE_PROVIDER_SITE_OTHER): Payer: Medicare Other | Admitting: Family Medicine

## 2016-02-16 ENCOUNTER — Encounter: Payer: Self-pay | Admitting: Family Medicine

## 2016-02-16 VITALS — BP 150/94 | HR 60 | Temp 97.6°F | Resp 14 | Ht 66.0 in | Wt 156.0 lb

## 2016-02-16 DIAGNOSIS — Z09 Encounter for follow-up examination after completed treatment for conditions other than malignant neoplasm: Secondary | ICD-10-CM

## 2016-02-16 LAB — CBC WITH DIFFERENTIAL/PLATELET
BASOS ABS: 0 {cells}/uL (ref 0–200)
Basophils Relative: 0 %
EOS ABS: 120 {cells}/uL (ref 15–500)
Eosinophils Relative: 2 %
HEMATOCRIT: 36.7 % (ref 35.0–45.0)
HEMOGLOBIN: 11.9 g/dL — AB (ref 12.0–15.0)
LYMPHS PCT: 27 %
Lymphs Abs: 1620 cells/uL (ref 850–3900)
MCH: 30.1 pg (ref 27.0–33.0)
MCHC: 32.4 g/dL (ref 32.0–36.0)
MCV: 92.9 fL (ref 80.0–100.0)
MONO ABS: 420 {cells}/uL (ref 200–950)
MPV: 9.4 fL (ref 7.5–12.5)
Monocytes Relative: 7 %
NEUTROS PCT: 64 %
Neutro Abs: 3840 cells/uL (ref 1500–7800)
Platelets: 224 10*3/uL (ref 140–400)
RBC: 3.95 MIL/uL (ref 3.80–5.10)
RDW: 13.5 % (ref 11.0–15.0)
WBC: 6 10*3/uL (ref 3.8–10.8)

## 2016-02-16 LAB — COMPLETE METABOLIC PANEL WITH GFR
ALBUMIN: 3.7 g/dL (ref 3.6–5.1)
ALK PHOS: 95 U/L (ref 33–130)
ALT: 19 U/L (ref 6–29)
AST: 25 U/L (ref 10–35)
BUN: 23 mg/dL (ref 7–25)
CALCIUM: 9.1 mg/dL (ref 8.6–10.4)
CHLORIDE: 103 mmol/L (ref 98–110)
CO2: 25 mmol/L (ref 20–31)
Creat: 1.19 mg/dL — ABNORMAL HIGH (ref 0.60–0.93)
GFR, EST AFRICAN AMERICAN: 51 mL/min — AB (ref >=60)
GFR, Est Non African American: 44 mL/min — ABNORMAL LOW (ref >=60)
Glucose, Bld: 92 mg/dL (ref 70–99)
POTASSIUM: 3.9 mmol/L (ref 3.5–5.3)
Sodium: 136 mmol/L (ref 135–146)
Total Bilirubin: 0.7 mg/dL (ref 0.2–1.2)
Total Protein: 7 g/dL (ref 6.1–8.1)

## 2016-02-16 NOTE — Progress Notes (Signed)
Subjective:    Patient ID: Jocelyn Moss, female    DOB: 1935/12/25, 81 y.o.   MRN: [id number]  Medication Refill     08/2015 Please see my previous office visit. The patient's memory problems have improved. She is accompanied today by her daughter. Her daughter states that since May, the memory problems have improved as the amount of stress the patient was under has improved. Recently she saw my partner for dizzy spells. Does have also improved. However she had an MRI obtained of the brain to evaluate further given her memory problems. This showed multiple areas of chronic microvascular ischemia as well as lacunar infarcts there previously unknown. After discussing the situation with the patient and her daughter, they both report a sudden change in her personality and her memory in December. These only gradually improved after May. At the present time she has no neurologic deficits although she does still have some mild short-term memory loss but is very difficult to discover unless you know the patient very well. She is not taking any antiplatelet agent. She is not taking any cholesterol medication. The last cholesterol see is an LDL cholesterol greater than 150 in 2013. She stopped taking Crestor due to myalgias.  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 Typically I believe that these are asymptomatic. However I believe the patient was under a tremendous amount of stress in December caring for family members that only highlighted the mild brain damage that she had. At that stress improved in May, the symptoms improved. Therefore I feel that we need to focus on prevention of further mini strokes, etc. I recommended she start taking Plavix 75 mg a day and Lipitor 20 mg a day and then recheck fasting lipid panel in 3 months. I will use low-dose Lipitor because of her history of statin intolerance.  I spent over 30 minutes  with the patient and her daughter discussing the MRI findings, reviewing her medical history, performing her exam, and determining the next course of action.  12/30/15 She is tolerating Lipitor without difficulty. She denies any myalgias or right upper quadrant pain. She denies any chest pain or shortness of breath. Her blood pressure slightly high today. She states that she is a little anxious in a doctor's office. She denies any worsening memory problems. Her mood and affect are normal. Her family has not witnessed any change in behavior. She denies any new neurologic deficits. She denies any worsening of short-term memory. She has noticed more bruising since switching to Plavix but otherwise she is doing well.  AT that time, my plan was: Patient is tolerating Lipitor well. Return fasting for fasting lipid panel. Goal LDL cholesterol is less than 70. Also check a CBC to rule out anemia on Plavix. While the patient is checking lab work I'll also repeat a TSH so that she doesn't have to come back. I like to follow-up her TSH every 6 months. Her blood pressure today is elevated. I've asked the patient check her blood pressure everyday and repeated tomorrow when she comes in fasting for lab work. If consistently greater than 140/90, I will start the patient on an ACE inhibitor.  02/16/16 Was admitted to the hospital January 12 with nausea vomiting and diarrhea secondary to confirmed noro virus. She was found to have chronic kidney injury as well secondary to dehydration as well as orthostatic hypotension. She was treated with IV fluids  and was kept in the hospital for a few days to recover. She is here today for hospital follow-up and to recheck her renal function.  Creatinine was 2.32 on admission and was 1.27 on the day of discharge January 14.  GI pathogen panel was positive for noro virus.  Prior to going to the hospital, the patient weight 159 pounds. Upon discharge from the hospital she weighed 150  pounds. Today she is back up to 156 pounds. She still feels extremely weak and fatigued. She is also slightly short of breath with activity. She denies any chest pain or pleurisy or cough or fever. She has a history of asthma and she is compliant taking her Symbicort. I believe the majority of her symptoms or deconditioning due to her rapid weight loss from dehydration. He is still taking metoprolol 25 mg twice a day which she's been taking for quite some time due to her hyperthyroidism as well as hypertension. Is possible that this medication is lowering her blood pressure and contributing to her fatigue some. Her blood pressure here today is elevated but at home it is much better. Past Medical History:  Diagnosis Date  . Atrial fibrillation ([place])   . Breast cancer ([place]) 1990  . Chest pain   . Colon cancer ([place])    ileostomy 2000  . History of colon cancer   . Hypercholesterolemia   . Hypothyroidism   . Multiple lacunar infarcts ([place])   . MVP (mitral valve prolapse)   . Osteoporosis    osteopenia  . SOB (shortness of breath)    Past Surgical History:  Procedure Laterality Date  . BREAST LUMPECTOMY    . COLON SURGERY  2000   ileostomy  . ILEOSTOMY     Current Outpatient Prescriptions on File Prior to Visit  Medication Sig Dispense Refill  . atorvastatin (LIPITOR) 20 MG tablet Take 1 tablet (20 mg total) by mouth daily. 90 tablet 3  . clopidogrel (PLAVIX) 75 MG tablet Take 1 tablet (75 mg total) by mouth daily. 90 tablet 3  . levothyroxine (SYNTHROID, LEVOTHROID) 100 MCG tablet TAKE ONE TABLET BY MOUTH EVERY OTHER DAY 45 tablet 3  . levothyroxine (SYNTHROID, LEVOTHROID) 112 MCG tablet TAKE ONE TABLET BY MOUTH EVERY OTHER DAY 45 tablet 3  . loperamide (IMODIUM) 2 MG capsule Take 2 capsules (4 mg total) by mouth as needed for diarrhea or loose stools. DO NOT TAKE MORE THAN '16mg'$  (4 tablets) per day 30 capsule 0  . metoprolol tartrate (LOPRESSOR) 25 MG tablet TAKE ONE TABLET BY MOUTH TWICE  DAILY 180 tablet 3  . mometasone-formoterol (DULERA) 100-5 MCG/ACT AERO Inhale 2 puffs into the lungs 2 (two) times daily. 39 g 3   No current facility-administered medications on file prior to visit.    Allergies  Allergen Reactions  . Crestor [Rosuvastatin Calcium] Other (See Comments)    Joint pain, effected liver function   . Rosuvastatin Other (See Comments)    Joint pain, effected liver function  . Zetia [Ezetimibe] Other (See Comments)    Joint pain, effected liver function    Social History   Social History  . Marital status: Married    Spouse name: N/A  . Number of children: N/A  . Years of education: N/A   Occupational History  . Not on file.   Social History Main Topics  . Smoking status: Never Smoker  . Smokeless tobacco: Never Used  . Alcohol use No  . Drug use: No  . Sexual activity:  Not on file   Other Topics Concern  . Not on file   Social History Narrative  . No narrative on file      Review of Systems  All other systems reviewed and are negative.      Objective:   Physical Exam  Constitutional: She is oriented to person, place, and time.  Cardiovascular: Normal rate, regular rhythm and normal heart sounds.   No murmur heard. Pulmonary/Chest: Effort normal and breath sounds normal. No respiratory distress. She has no wheezes. She has no rales.  Neurological: She is alert and oriented to person, place, and time. She has normal reflexes. No cranial nerve deficit. She exhibits normal muscle tone. Coordination normal.  Psychiatric: She has a normal mood and affect. Her behavior is normal. Judgment and thought content normal.  Vitals reviewed.    Wt Readings from Last 3 Encounters:  02/16/16 156 lb (70.8 kg)  02/08/16 150 lb 9.6 oz (68.3 kg)  12/30/15 159 lb (72.1 kg)        Assessment & Plan:  Hospital discharge follow-up - Plan: CBC with Differential/Platelet, COMPLETE METABOLIC PANEL WITH GFR  Patient lost substantial weight in a  rapid period of time secondary to the Noro virus and dehydration.  Therefore I believe it's reasonable that she still feels weak and tired. I anticipate that the symptoms will gradually improve over the next 7-14 days. Meanwhile I would like to reduce the dose of her metoprolol temporarily to 12.5 mg twice a day. She will call me back in February with her blood pressures. Once she recovers fully, we will increase metoprolol back to his current dose but at the time being I think she will feel better on less beta blocker. This may also help her shortness of breath given the fact she has some asthma. Check her renal function today. I've encouraged the patient to drink 1 can of ensure with lunch and dinner to increase her protein intake to help her recover quicker.

## 2016-03-02 ENCOUNTER — Telehealth: Payer: Self-pay | Admitting: Family Medicine

## 2016-03-02 NOTE — Telephone Encounter (Signed)
BP readings: 131/88,136/75,130/75,135/78,130/74,127/69,130/71,136/78,131/72,137/76,136/73,                       156/84,124/66,146/84,128/70,144/86,141/77,128/75,121/81,140/80,130/70.

## 2016-03-04 NOTE — Telephone Encounter (Signed)
Patient aware of providers recommendations.  

## 2016-03-04 NOTE — Telephone Encounter (Signed)
Most BP look great.  Would not change medications at this time until BP starts to rise.

## 2016-05-31 ENCOUNTER — Ambulatory Visit (INDEPENDENT_AMBULATORY_CARE_PROVIDER_SITE_OTHER): Payer: Medicare Other | Admitting: Family Medicine

## 2016-05-31 ENCOUNTER — Encounter: Payer: Self-pay | Admitting: Family Medicine

## 2016-05-31 VITALS — BP 150/76 | HR 64 | Temp 97.5°F | Resp 16 | Ht 66.0 in | Wt 160.0 lb

## 2016-05-31 DIAGNOSIS — I10 Essential (primary) hypertension: Secondary | ICD-10-CM | POA: Diagnosis not present

## 2016-05-31 DIAGNOSIS — E038 Other specified hypothyroidism: Secondary | ICD-10-CM | POA: Diagnosis not present

## 2016-05-31 DIAGNOSIS — I6930 Unspecified sequelae of cerebral infarction: Secondary | ICD-10-CM

## 2016-05-31 NOTE — Progress Notes (Signed)
Subjective:    Patient ID: Jocelyn Moss, female    DOB: 23-Feb-1935, 81 y.o.   MRN: [id number]  Medication Refill   Hypertension     08/2015 Please see my previous office visit. The patient's memory problems have improved. She is accompanied today by her daughter. Her daughter states that since May, the memory problems have improved as the amount of stress the patient was under has improved. Recently she saw my partner for dizzy spells. Does have also improved. However she had an MRI obtained of the brain to evaluate further given her memory problems. This showed multiple areas of chronic microvascular ischemia as well as lacunar infarcts there previously unknown. After discussing the situation with the patient and her daughter, they both report a sudden change in her personality and her memory in December. These only gradually improved after May. At the present time she has no neurologic deficits although she does still have some mild short-term memory loss but is very difficult to discover unless you know the patient very well. She is not taking any antiplatelet agent. She is not taking any cholesterol medication. The last cholesterol see is an LDL cholesterol greater than 150 in 2013. She stopped taking Crestor due to myalgias.  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 Typically I believe that these are asymptomatic. However I believe the patient was under a tremendous amount of stress in December caring for family members that only highlighted the mild brain damage that she had. At that stress improved in May, the symptoms improved. Therefore I feel that we need to focus on prevention of further mini strokes, etc. I recommended she start taking Plavix 75 mg a day and Lipitor 20 mg a day and then recheck fasting lipid panel in 3 months. I will use low-dose Lipitor because of her history of statin intolerance.  I spent  over 30 minutes with the patient and her daughter discussing the MRI findings, reviewing her medical history, performing her exam, and determining the next course of action.  12/30/15 She is tolerating Lipitor without difficulty. She denies any myalgias or right upper quadrant pain. She denies any chest pain or shortness of breath. Her blood pressure slightly high today. She states that she is a little anxious in a doctor's office. She denies any worsening memory problems. Her mood and affect are normal. Her family has not witnessed any change in behavior. She denies any new neurologic deficits. She denies any worsening of short-term memory. She has noticed more bruising since switching to Plavix but otherwise she is doing well.  AT that time, my plan was: Patient is tolerating Lipitor well. Return fasting for fasting lipid panel. Goal LDL cholesterol is less than 70. Also check a CBC to rule out anemia on Plavix. While the patient is checking lab work I'll also repeat a TSH so that she doesn't have to come back. I like to follow-up her TSH every 6 months. Her blood pressure today is elevated. I've asked the patient check her blood pressure everyday and repeated tomorrow when she comes in fasting for lab work. If consistently greater than 140/90, I will start the patient on an ACE inhibitor.  02/16/16 Was admitted to the hospital January 12 with nausea vomiting and diarrhea secondary to confirmed noro virus. She was found to have chronic kidney injury as well secondary to dehydration as well as orthostatic hypotension. She was treated  with IV fluids and was kept in the hospital for a few days to recover. She is here today for hospital follow-up and to recheck her renal function.  Creatinine was 2.32 on admission and was 1.27 on the day of discharge January 14.  GI pathogen panel was positive for noro virus.  Prior to going to the hospital, the patient weight 159 pounds. Upon discharge from the hospital she  weighed 150 pounds. Today she is back up to 156 pounds. She still feels extremely weak and fatigued. She is also slightly short of breath with activity. She denies any chest pain or pleurisy or cough or fever. She has a history of asthma and she is compliant taking her Symbicort. I believe the majority of her symptoms or deconditioning due to her rapid weight loss from dehydration. He is still taking metoprolol 25 mg twice a day which she's been taking for quite some time due to her hyperthyroidism as well as hypertension. Is possible that this medication is lowering her blood pressure and contributing to her fatigue some. Her blood pressure here today is elevated but at home it is much better.  At that time, my plan was: Patient lost substantial weight in a rapid period of time secondary to the Noro virus and dehydration.  Therefore I believe it's reasonable that she still feels weak and tired. I anticipate that the symptoms will gradually improve over the next 7-14 days. Meanwhile I would like to reduce the dose of her metoprolol temporarily to 12.5 mg twice a day. She will call me back in February with her blood pressures. Once she recovers fully, we will increase metoprolol back to his current dose but at the time being I think she will feel better on less beta blocker. This may also help her shortness of breath given the fact she has some asthma. Check her renal function today. I've encouraged the patient to drink 1 can of ensure with lunch and dinner to increase her protein intake to help her recover quicker. 05/31/16 Patient is mainly here today with her husband because he feels poorly. However she would like to be seen today while she is here to recheck her blood pressure, her cholesterol, and her thyroid. Please see previous office visits. She is currently on atorvastatin 20 mg a day due to her history of a lacunar infarct. Her goal LDL cholesterol is less than 70. She denies any myalgias or right upper  quadrant pain. She is due for fasting lab work. She is also on levothyroxine 112 g by mouth daily for hypothyroidism. She is due to recheck a TSH. Her blood pressure here today is elevated. However her home blood pressures in the morning are between 130 and 140/70-80. These are well controlled. She states that her blood pressure may be slightly higher in the evening. Her heart rate is borderline bradycardic on metoprolol. Past Medical History:  Diagnosis Date  . Atrial fibrillation ([place])   . Breast cancer ([place]) 1990  . Chest pain   . Colon cancer ([place])    ileostomy 2000  . History of colon cancer   . Hypercholesterolemia   . Hypothyroidism   . Multiple lacunar infarcts ([place])   . MVP (mitral valve prolapse)   . Osteoporosis    osteopenia  . SOB (shortness of breath)    Past Surgical History:  Procedure Laterality Date  . BREAST LUMPECTOMY    . COLON SURGERY  2000   ileostomy  . ILEOSTOMY  Current Outpatient Prescriptions on File Prior to Visit  Medication Sig Dispense Refill  . atorvastatin (LIPITOR) 20 MG tablet Take 1 tablet (20 mg total) by mouth daily. 90 tablet 3  . clopidogrel (PLAVIX) 75 MG tablet Take 1 tablet (75 mg total) by mouth daily. 90 tablet 3  . levothyroxine (SYNTHROID, LEVOTHROID) 100 MCG tablet TAKE ONE TABLET BY MOUTH EVERY OTHER DAY 45 tablet 3  . levothyroxine (SYNTHROID, LEVOTHROID) 112 MCG tablet TAKE ONE TABLET BY MOUTH EVERY OTHER DAY 45 tablet 3  . loperamide (IMODIUM) 2 MG capsule Take 2 capsules (4 mg total) by mouth as needed for diarrhea or loose stools. DO NOT TAKE MORE THAN '16mg'$  (4 tablets) per day 30 capsule 0  . metoprolol tartrate (LOPRESSOR) 25 MG tablet TAKE ONE TABLET BY MOUTH TWICE DAILY (Patient taking differently: 1/2 TABLET BY MOUTH TWICE DAILY) 180 tablet 3  . mometasone-formoterol (DULERA) 100-5 MCG/ACT AERO Inhale 2 puffs into the lungs 2 (two) times daily. 39 g 3   No current facility-administered medications on file prior to visit.     Allergies  Allergen Reactions  . Crestor [Rosuvastatin Calcium] Other (See Comments)    Joint pain, effected liver function   . Rosuvastatin Other (See Comments)    Joint pain, effected liver function  . Zetia [Ezetimibe] Other (See Comments)    Joint pain, effected liver function    Social History   Social History  . Marital status: Married    Spouse name: N/A  . Number of children: N/A  . Years of education: N/A   Occupational History  . Not on file.   Social History Main Topics  . Smoking status: Never Smoker  . Smokeless tobacco: Never Used  . Alcohol use No  . Drug use: No  . Sexual activity: Not on file   Other Topics Concern  . Not on file   Social History Narrative  . No narrative on file      Review of Systems  All other systems reviewed and are negative.      Objective:   Physical Exam  Constitutional: She is oriented to person, place, and time.  Cardiovascular: Normal rate, regular rhythm and normal heart sounds.   No murmur heard. Pulmonary/Chest: Effort normal and breath sounds normal. No respiratory distress. She has no wheezes. She has no rales.  Neurological: She is alert and oriented to person, place, and time. She has normal reflexes. No cranial nerve deficit. She exhibits normal muscle tone. Coordination normal.  Psychiatric: She has a normal mood and affect. Her behavior is normal. Judgment and thought content normal.  Vitals reviewed.    Wt Readings from Last 3 Encounters:  05/31/16 160 lb (72.6 kg)  02/16/16 156 lb (70.8 kg)  02/08/16 150 lb 9.6 oz (68.3 kg)   Her weight has recovered nicely since her illness in January    Assessment & Plan:  Other specified hypothyroidism - Plan: TSH  Benign essential HTN  Late effect of lacunar infarction - Plan: CBC with Differential/Platelet, COMPLETE METABOLIC PANEL WITH GFR, Lipid panel  Recheck a CMP and fasting lipid panel. Goal LDL cholesterol is less than 70. Recheck a TSH to  ensure that the dosage of levothyroxine is appropriate. I will continue Plavix given the fact that her stroke occurred while taking aspirin. She denies any bleeding or bruising. I will monitor a CBC on Plavix. Her blood pressure today is borderline. Given her age, I would be willing to accept a systolic blood pressure  in the mid 140s. She will check her blood pressure everyday for the next week and write the values down for me to review. If her systolic blood pressure is consistently above 150, I would add lisinopril  to try to keep the majority of her blood pressures under 295 systolic

## 2016-06-01 ENCOUNTER — Other Ambulatory Visit: Payer: Medicare Other

## 2016-06-01 LAB — CBC WITH DIFFERENTIAL/PLATELET
BASOS ABS: 0 {cells}/uL (ref 0–200)
Basophils Relative: 0 %
EOS ABS: 141 {cells}/uL (ref 15–500)
EOS PCT: 3 %
HCT: 37 % (ref 35.0–45.0)
HEMOGLOBIN: 12.3 g/dL (ref 12.0–15.0)
Lymphocytes Relative: 23 %
Lymphs Abs: 1081 cells/uL (ref 850–3900)
MCH: 31 pg (ref 27.0–33.0)
MCHC: 33.2 g/dL (ref 32.0–36.0)
MCV: 93.2 fL (ref 80.0–100.0)
MONO ABS: 376 {cells}/uL (ref 200–950)
MPV: 9.1 fL (ref 7.5–12.5)
Monocytes Relative: 8 %
NEUTROS ABS: 3102 {cells}/uL (ref 1500–7800)
Neutrophils Relative %: 66 %
Platelets: 169 10*3/uL (ref 140–400)
RBC: 3.97 MIL/uL (ref 3.80–5.10)
RDW: 13.9 % (ref 11.0–15.0)
WBC: 4.7 10*3/uL (ref 3.8–10.8)

## 2016-06-01 LAB — COMPLETE METABOLIC PANEL WITH GFR
ALBUMIN: 4 g/dL (ref 3.6–5.1)
ALK PHOS: 112 U/L (ref 33–130)
ALT: 16 U/L (ref 6–29)
AST: 25 U/L (ref 10–35)
BILIRUBIN TOTAL: 0.8 mg/dL (ref 0.2–1.2)
BUN: 21 mg/dL (ref 7–25)
CO2: 18 mmol/L — ABNORMAL LOW (ref 20–31)
Calcium: 9 mg/dL (ref 8.6–10.4)
Chloride: 107 mmol/L (ref 98–110)
Creat: 1.08 mg/dL — ABNORMAL HIGH (ref 0.60–0.93)
GFR, EST NON AFRICAN AMERICAN: 49 mL/min — AB (ref >=60)
GFR, Est African American: 57 mL/min — ABNORMAL LOW (ref >=60)
GLUCOSE: 90 mg/dL (ref 70–99)
Potassium: 3.9 mmol/L (ref 3.5–5.3)
SODIUM: 141 mmol/L (ref 135–146)
TOTAL PROTEIN: 7.1 g/dL (ref 6.1–8.1)

## 2016-06-01 LAB — LIPID PANEL
Cholesterol: 146 mg/dL (ref <200)
HDL: 78 mg/dL (ref >50)
LDL CALC: 55 mg/dL (ref <100)
TRIGLYCERIDES: 67 mg/dL (ref <150)
Total CHOL/HDL Ratio: 1.9 Ratio (ref <5.0)
VLDL: 13 mg/dL (ref <30)

## 2016-06-01 LAB — TSH: TSH: 3.86 mIU/L

## 2016-06-09 ENCOUNTER — Telehealth: Payer: Self-pay | Admitting: Family Medicine

## 2016-06-09 NOTE — Telephone Encounter (Signed)
BP readings AM & PM :  031/28-11 139/80-68 886/77-37 145/84-57 144/78-59 366/81-59 139/74-56 470/76-15 183/43-73 578/97-84 139/77-63 784/12-82 125/73-62 081/38-87

## 2016-06-10 NOTE — Telephone Encounter (Signed)
I think these are good enough.  No change in meds at this time.

## 2016-06-10 NOTE — Telephone Encounter (Signed)
Patient aware of providers recommendations via vm 

## 2016-09-09 ENCOUNTER — Other Ambulatory Visit: Payer: Self-pay | Admitting: Family Medicine

## 2016-09-09 DIAGNOSIS — I6930 Unspecified sequelae of cerebral infarction: Secondary | ICD-10-CM

## 2016-09-15 ENCOUNTER — Encounter: Payer: Self-pay | Admitting: Family Medicine

## 2016-09-15 ENCOUNTER — Ambulatory Visit (INDEPENDENT_AMBULATORY_CARE_PROVIDER_SITE_OTHER): Payer: Medicare Other | Admitting: Family Medicine

## 2016-09-15 VITALS — BP 150/90 | HR 64 | Temp 98.1°F | Resp 16 | Ht 66.0 in | Wt 153.0 lb

## 2016-09-15 DIAGNOSIS — I10 Essential (primary) hypertension: Secondary | ICD-10-CM | POA: Diagnosis not present

## 2016-09-15 DIAGNOSIS — I6930 Unspecified sequelae of cerebral infarction: Secondary | ICD-10-CM

## 2016-09-15 DIAGNOSIS — E038 Other specified hypothyroidism: Secondary | ICD-10-CM | POA: Diagnosis not present

## 2016-09-15 MED ORDER — LEVOTHYROXINE SODIUM 112 MCG PO TABS: 112.0000 ug | ORAL_TABLET | ORAL | 3 refills | Status: DC

## 2016-09-15 MED ORDER — LEVOTHYROXINE SODIUM 100 MCG PO TABS: 100.0000 ug | ORAL_TABLET | ORAL | 3 refills | Status: DC

## 2016-09-15 MED ORDER — ATORVASTATIN CALCIUM 20 MG PO TABS: 20.0000 mg | ORAL_TABLET | Freq: Every day | ORAL | 3 refills | Status: DC

## 2016-09-15 MED ORDER — CLOPIDOGREL BISULFATE 75 MG PO TABS: 75.0000 mg | ORAL_TABLET | Freq: Every day | ORAL | 3 refills | Status: DC

## 2016-09-15 NOTE — Progress Notes (Signed)
Subjective:    Patient ID: Jocelyn Moss, female    DOB: 11-Mar-1935, 81 y.o.   MRN: [id number]  Hypertension   Medication Refill     08/2015 Please see my previous office visit. The patient's memory problems have improved. She is accompanied today by her daughter. Her daughter states that since May, the memory problems have improved as the amount of stress the patient was under has improved. Recently she saw my partner for dizzy spells. Does have also improved. However she had an MRI obtained of the brain to evaluate further given her memory problems. This showed multiple areas of chronic microvascular ischemia as well as lacunar infarcts there previously unknown. After discussing the situation with the patient and her daughter, they both report a sudden change in her personality and her memory in December. These only gradually improved after May. At the present time she has no neurologic deficits although she does still have some mild short-term memory loss but is very difficult to discover unless you know the patient very well. She is not taking any antiplatelet agent. She is not taking any cholesterol medication. The last cholesterol see is an LDL cholesterol greater than 150 in 2013. She stopped taking Crestor due to myalgias.  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 Typically I believe that these are asymptomatic. However I believe the patient was under a tremendous amount of stress in December caring for family members that only highlighted the mild brain damage that she had. At that stress improved in May, the symptoms improved. Therefore I feel that we need to focus on prevention of further mini strokes, etc. I recommended she start taking Plavix 75 mg a day and Lipitor 20 mg a day and then recheck fasting lipid panel in 3 months. I will use low-dose Lipitor because of her history of statin intolerance.  I spent  over 30 minutes with the patient and her daughter discussing the MRI findings, reviewing her medical history, performing her exam, and determining the next course of action.  12/30/15 She is tolerating Lipitor without difficulty. She denies any myalgias or right upper quadrant pain. She denies any chest pain or shortness of breath. Her blood pressure slightly high today. She states that she is a little anxious in a doctor's office. She denies any worsening memory problems. Her mood and affect are normal. Her family has not witnessed any change in behavior. She denies any new neurologic deficits. She denies any worsening of short-term memory. She has noticed more bruising since switching to Plavix but otherwise she is doing well.  AT that time, my plan was: Patient is tolerating Lipitor well. Return fasting for fasting lipid panel. Goal LDL cholesterol is less than 70. Also check a CBC to rule out anemia on Plavix. While the patient is checking lab work I'll also repeat a TSH so that she doesn't have to come back. I like to follow-up her TSH every 6 months. Her blood pressure today is elevated. I've asked the patient check her blood pressure everyday and repeated tomorrow when she comes in fasting for lab work. If consistently greater than 140/90, I will start the patient on an ACE inhibitor.  02/16/16 Was admitted to the hospital January 12 with nausea vomiting and diarrhea secondary to confirmed noro virus. She was found to have chronic kidney injury as well secondary to dehydration as well as orthostatic hypotension. She was treated  with IV fluids and was kept in the hospital for a few days to recover. She is here today for hospital follow-up and to recheck her renal function.  Creatinine was 2.32 on admission and was 1.27 on the day of discharge January 14.  GI pathogen panel was positive for noro virus.  Prior to going to the hospital, the patient weight 159 pounds. Upon discharge from the hospital she  weighed 150 pounds. Today she is back up to 156 pounds. She still feels extremely weak and fatigued. She is also slightly short of breath with activity. She denies any chest pain or pleurisy or cough or fever. She has a history of asthma and she is compliant taking her Symbicort. I believe the majority of her symptoms or deconditioning due to her rapid weight loss from dehydration. He is still taking metoprolol 25 mg twice a day which she's been taking for quite some time due to her hyperthyroidism as well as hypertension. Is possible that this medication is lowering her blood pressure and contributing to her fatigue some. Her blood pressure here today is elevated but at home it is much better.  At that time, my plan was: Patient lost substantial weight in a rapid period of time secondary to the Noro virus and dehydration.  Therefore I believe it's reasonable that she still feels weak and tired. I anticipate that the symptoms will gradually improve over the next 7-14 days. Meanwhile I would like to reduce the dose of her metoprolol temporarily to 12.5 mg twice a day. She will call me back in February with her blood pressures. Once she recovers fully, we will increase metoprolol back to his current dose but at the time being I think she will feel better on less beta blocker. This may also help her shortness of breath given the fact she has some asthma. Check her renal function today. I've encouraged the patient to drink 1 can of ensure with lunch and dinner to increase her protein intake to help her recover quicker. 05/31/16 Patient is mainly here today with her husband because he feels poorly. However she would like to be seen today while she is here to recheck her blood pressure, her cholesterol, and her thyroid. Please see previous office visits. She is currently on atorvastatin 20 mg a day due to her history of a lacunar infarct. Her goal LDL cholesterol is less than 70. She denies any myalgias or right upper  quadrant pain. She is due for fasting lab work. She is also on levothyroxine 112 g by mouth daily for hypothyroidism. She is due to recheck a TSH. Her blood pressure here today is elevated. However her home blood pressures in the morning are between 130 and 140/70-80. These are well controlled. She states that her blood pressure may be slightly higher in the evening. Her heart rate is borderline bradycardic on metoprolol.  At that time, my plan was: Recheck a CMP and fasting lipid panel. Goal LDL cholesterol is less than 70. Recheck a TSH to ensure that the dosage of levothyroxine is appropriate. I will continue Plavix given the fact that her stroke occurred while taking aspirin. She denies any bleeding or bruising. I will monitor a CBC on Plavix. Her blood pressure today is borderline. Given her age, I would be willing to accept a systolic blood pressure in the mid 140s. She will check her blood pressure everyday for the next week and write the values down for me to review. If her systolic blood  pressure is consistently above 150, I would add lisinopril  to try to keep the majority of her blood pressures under 423 systolic  5/36/14 Patient came in today just for a checkup. Her blood pressure here is elevated at 150/90. However she is checking her blood pressure frequently at home and she states that the vast majority of the numbers are in the 431V systolic. The highest she is recorded at home has been 142. She's never had a 150. The blood pressure readings that she is getting home sound acceptable. She denies any chest pain shortness of breath or dyspnea on exertion. She is compliant taking the Plavix. She she denies any significant bleeding, hematochezia, or melena. She does have bruising on the dorsal surfaces of both forearms consistent with senile purpura. She denies any epistaxis or hemoptysis. She denies any myalgias or right upper quadrant pain on her Lipitor. She is here today alone. She denies any  progression of her memory loss. However in our encounter today, occasionally she is unable to tell me the name of her medication or other facts that normally she will be able to remember leaving me to suspect that her memory may be slightly worse than previously. Past Medical History:  Diagnosis Date  . Atrial fibrillation ([place])   . Breast cancer ([place]) 1990  . Chest pain   . Colon cancer ([place])    ileostomy 2000  . History of colon cancer   . Hypercholesterolemia   . Hypothyroidism   . Multiple lacunar infarcts ([place])   . MVP (mitral valve prolapse)   . Osteoporosis    osteopenia  . SOB (shortness of breath)    Past Surgical History:  Procedure Laterality Date  . BREAST LUMPECTOMY    . COLON SURGERY  2000   ileostomy  . ILEOSTOMY     Current Outpatient Prescriptions on File Prior to Visit  Medication Sig Dispense Refill  . loperamide (IMODIUM) 2 MG capsule Take 2 capsules (4 mg total) by mouth as needed for diarrhea or loose stools. DO NOT TAKE MORE THAN 16mg  (4 tablets) per day 30 capsule 0  . metoprolol tartrate (LOPRESSOR) 25 MG tablet TAKE ONE TABLET BY MOUTH TWICE DAILY (Patient taking differently: 1/2 TABLET BY MOUTH TWICE DAILY) 180 tablet 3  . mometasone-formoterol (DULERA) 100-5 MCG/ACT AERO Inhale 2 puffs into the lungs 2 (two) times daily. 39 g 3   No current facility-administered medications on file prior to visit.    Allergies  Allergen Reactions  . Crestor [Rosuvastatin Calcium] Other (See Comments)    Joint pain, effected liver function   . Rosuvastatin Other (See Comments)    Joint pain, effected liver function  . Zetia [Ezetimibe] Other (See Comments)    Joint pain, effected liver function    Social History   Social History  . Marital status: Married    Spouse name: N/A  . Number of children: N/A  . Years of education: N/A   Occupational History  . Not on file.   Social History Main Topics  . Smoking status: Never Smoker  . Smokeless tobacco:  Never Used  . Alcohol use No  . Drug use: No  . Sexual activity: Not on file   Other Topics Concern  . Not on file   Social History Narrative  . No narrative on file      Review of Systems  All other systems reviewed and are negative.      Objective:   Physical Exam  Constitutional: She  is oriented to person, place, and time.  Cardiovascular: Normal rate, regular rhythm and normal heart sounds.   No murmur heard. Pulmonary/Chest: Effort normal and breath sounds normal. No respiratory distress. She has no wheezes. She has no rales.  Neurological: She is alert and oriented to person, place, and time. She has normal reflexes. No cranial nerve deficit. She exhibits normal muscle tone. Coordination normal.  Psychiatric: She has a normal mood and affect. Her behavior is normal. Judgment and thought content normal.  Vitals reviewed.       Assessment & Plan:  Benign essential HTN  Late effect of lacunar infarction - Plan: atorvastatin (LIPITOR) 20 MG tablet, clopidogrel (PLAVIX) 75 MG tablet  Other specified hypothyroidism - Plan: levothyroxine (SYNTHROID, LEVOTHROID) 100 MCG tablet, levothyroxine (SYNTHROID, LEVOTHROID) 112 MCG tablet  Home blood pressure readings are acceptable. I will make no change in her blood pressure medication but I did ask her to check her blood pressure everyday at home. If consistently greater than 937 systolic, I would add lisinopril. Continue her antiplatelet medication for secondary prevention of stroke. Also continue her Lipitor. Her labs were recently checked in May and were normal. Her LDL cholesterol was below 70. Therefore I will not make any changes in her dosage of Lipitor. I would like the patient to return in November for a CBC, CMP, fasting lipid panel, and TSH

## 2016-10-19 ENCOUNTER — Other Ambulatory Visit: Payer: Self-pay | Admitting: Obstetrics and Gynecology

## 2016-10-19 DIAGNOSIS — Z1231 Encounter for screening mammogram for malignant neoplasm of breast: Secondary | ICD-10-CM

## 2016-10-22 ENCOUNTER — Ambulatory Visit (INDEPENDENT_AMBULATORY_CARE_PROVIDER_SITE_OTHER): Payer: Medicare Other | Admitting: Family Medicine

## 2016-10-22 DIAGNOSIS — Z23 Encounter for immunization: Secondary | ICD-10-CM | POA: Diagnosis not present

## 2016-11-03 ENCOUNTER — Ambulatory Visit
Admission: RE | Admit: 2016-11-03 | Discharge: 2016-11-03 | Disposition: A | Discharge status: Home / Self Care | Payer: Medicare Other | Source: Ambulatory Visit | Attending: Obstetrics and Gynecology | Admitting: Obstetrics and Gynecology

## 2016-11-03 DIAGNOSIS — Z1231 Encounter for screening mammogram for malignant neoplasm of breast: Secondary | ICD-10-CM

## 2016-11-03 HISTORY — DX: Personal history of irradiation: Z92.3

## 2016-11-03 HISTORY — DX: Personal history of antineoplastic chemotherapy: Z92.21

## 2017-02-02 ENCOUNTER — Encounter: Payer: Self-pay | Admitting: Family Medicine

## 2017-02-04 ENCOUNTER — Other Ambulatory Visit: Payer: Self-pay | Admitting: Family Medicine

## 2017-02-10 ENCOUNTER — Ambulatory Visit (INDEPENDENT_AMBULATORY_CARE_PROVIDER_SITE_OTHER): Payer: Medicare Other | Admitting: *Deleted

## 2017-02-10 DIAGNOSIS — Z23 Encounter for immunization: Secondary | ICD-10-CM

## 2017-02-10 NOTE — Progress Notes (Signed)
Patient seen in office for Shingles Vaccination.   Tolerated IM administration well.   Immunization history updated.

## 2017-03-08 ENCOUNTER — Other Ambulatory Visit: Payer: Self-pay | Admitting: Family Medicine

## 2017-05-12 ENCOUNTER — Ambulatory Visit (INDEPENDENT_AMBULATORY_CARE_PROVIDER_SITE_OTHER): Payer: Medicare Other

## 2017-05-12 DIAGNOSIS — Z23 Encounter for immunization: Secondary | ICD-10-CM

## 2017-05-12 NOTE — Progress Notes (Signed)
Patient was in office for 2nd shingrix injection.Patient received injection in her right deltoid. Patient tolerated well

## 2017-10-06 ENCOUNTER — Other Ambulatory Visit: Payer: Self-pay | Admitting: Obstetrics and Gynecology

## 2017-10-06 DIAGNOSIS — Z1231 Encounter for screening mammogram for malignant neoplasm of breast: Secondary | ICD-10-CM

## 2017-10-18 ENCOUNTER — Ambulatory Visit: Payer: Medicare Other | Admitting: Family Medicine

## 2017-10-18 ENCOUNTER — Encounter: Payer: Self-pay | Admitting: Family Medicine

## 2017-10-18 VITALS — BP 110/60 | HR 64 | Temp 98.1°F | Resp 16 | Ht 66.0 in | Wt 149.0 lb

## 2017-10-18 DIAGNOSIS — E038 Other specified hypothyroidism: Secondary | ICD-10-CM | POA: Diagnosis not present

## 2017-10-18 DIAGNOSIS — I10 Essential (primary) hypertension: Secondary | ICD-10-CM

## 2017-10-18 DIAGNOSIS — R0789 Other chest pain: Secondary | ICD-10-CM | POA: Diagnosis not present

## 2017-10-18 DIAGNOSIS — R5383 Other fatigue: Secondary | ICD-10-CM | POA: Diagnosis not present

## 2017-10-18 DIAGNOSIS — N390 Urinary tract infection, site not specified: Secondary | ICD-10-CM

## 2017-10-18 LAB — URINALYSIS, ROUTINE W REFLEX MICROSCOPIC
Bilirubin Urine: NEGATIVE
GLUCOSE, UA: NEGATIVE
Nitrite: NEGATIVE
PROTEIN: NEGATIVE
Specific Gravity, Urine: 1.015 (ref 1.001–1.03)
pH: 5.5 (ref 5.0–8.0)

## 2017-10-18 LAB — MICROSCOPIC MESSAGE

## 2017-10-18 MED ORDER — LOPERAMIDE HCL 2 MG PO CAPS: 4.0000 mg | ORAL_CAPSULE | ORAL | 0 refills | Status: DC | PRN

## 2017-10-18 MED ORDER — PROMETHAZINE HCL 12.5 MG PO TABS: 12.5000 mg | ORAL_TABLET | Freq: Three times a day (TID) | ORAL | 0 refills | Status: DC | PRN

## 2017-10-18 NOTE — Progress Notes (Signed)
Subjective:    Patient ID: Jocelyn Moss, female    DOB: 09-13-1935, 82 y.o.   MRN: [id number]  HPI Patient presents today for medicine check.  Past medical history is significant for a remote lacunar infarct that was discovered coincidentally during a work-up for memory loss.  For this, the patient is on Plavix for secondary prevention of stroke and is also on Lipitor strong management and secondary prevention of stroke.  Last week, the patient states that she felt extremely weak and tired.  She had one day where she developed a substernal chest discomfort there was approximately 3 on a scale of 1-10.  It occurred at night.  It lasted several hours.  There was no shortness of breath associated with it.  There was no nausea or vomiting.  It was very mild.  She took some Tums over-the-counter and the symptoms improved and have not returned.  She denies any further chest pain, angina, shortness of breath, or dyspnea on exertion however she has felt somewhat weak and tired over the last week.  Her husband is with her today and is concerned because she has not been eating very well.  He states that she has no appetite.  Compared to her weight 2 years ago, the patient has lost 10 pounds.  She states that she frequently only eats one meal a day and that she only picks at.  She is overdue to recheck her cholesterol and her thyroid.  Urinalysis today was obtained to evaluate for possible urinary tract infection versus dehydration.  There is +2 leukocyte Estrace however the patient denies any dysuria, frequency, urgency.  Urine sample was insufficient to allow microscopic analysis however I did send it for urine culture.  EKG today shows normal sinus rhythm with no evidence of ischemia or infarction. Past Medical History:  Diagnosis Date  . Atrial fibrillation ([place])   . Breast cancer ([place]) 1990  . Chest pain   . Colon cancer ([place])    ileostomy 2000  . History of colon cancer   . Hypercholesterolemia   .  Hypothyroidism   . Multiple lacunar infarcts ([place])   . MVP (mitral valve prolapse)   . Osteoporosis    osteopenia  . Personal history of chemotherapy   . Personal history of radiation therapy   . SOB (shortness of breath)    Past Surgical History:  Procedure Laterality Date  . BREAST BIOPSY    . BREAST LUMPECTOMY Left   . COLON SURGERY  2000   ileostomy  . ILEOSTOMY     Current Outpatient Medications on File Prior to Visit  Medication Sig Dispense Refill  . atorvastatin (LIPITOR) 20 MG tablet Take 1 tablet (20 mg total) by mouth daily. 90 tablet 3  . clopidogrel (PLAVIX) 75 MG tablet Take 1 tablet (75 mg total) by mouth daily. 90 tablet 3  . DULERA 100-5 MCG/ACT AERO USE 2 INHALATIONS TWICE A DAY 39 g 3  . levothyroxine (SYNTHROID, LEVOTHROID) 100 MCG tablet Take 1 tablet (100 mcg total) by mouth every other day. 45 tablet 3  . levothyroxine (SYNTHROID, LEVOTHROID) 112 MCG tablet Take 1 tablet (112 mcg total) by mouth every other day. 45 tablet 3  . loperamide (IMODIUM) 2 MG capsule Take 2 capsules (4 mg total) by mouth as needed for diarrhea or loose stools. DO NOT TAKE MORE THAN 16mg  (4 tablets) per day 30 capsule 0  . metoprolol tartrate (LOPRESSOR) 25 MG tablet TAKE ONE TABLET BY MOUTH TWICE DAILY  180 tablet 3   No current facility-administered medications on file prior to visit.    Allergies  Allergen Reactions  . Crestor [Rosuvastatin Calcium] Other (See Comments)    Joint pain, effected liver function   . Rosuvastatin Other (See Comments)    Joint pain, effected liver function  . Zetia [Ezetimibe] Other (See Comments)    Joint pain, effected liver function    Social History   Socioeconomic History  . Marital status: Married    Spouse name: Not on file  . Number of children: Not on file  . Years of education: Not on file  . Highest education level: Not on file  Occupational History  . Not on file  Social Needs  . Financial resource strain: Not on file  . Food  insecurity:    Worry: Not on file    Inability: Not on file  . Transportation needs:    Medical: Not on file    Non-medical: Not on file  Tobacco Use  . Smoking status: Never Smoker  . Smokeless tobacco: Never Used  Substance and Sexual Activity  . Alcohol use: No  . Drug use: No  . Sexual activity: Not on file  Lifestyle  . Physical activity:    Days per week: Not on file    Minutes per session: Not on file  . Stress: Not on file  Relationships  . Social connections:    Talks on phone: Not on file    Gets together: Not on file    Attends religious service: Not on file    Active member of club or organization: Not on file    Attends meetings of clubs or organizations: Not on file    Relationship status: Not on file  . Intimate partner violence:    Fear of current or ex partner: Not on file    Emotionally abused: Not on file    Physically abused: Not on file    Forced sexual activity: Not on file  Other Topics Concern  . Not on file  Social History Narrative  . Not on file      Review of Systems  All other systems reviewed and are negative.      Objective:   Physical Exam  Constitutional: She is oriented to person, place, and time. She appears well-developed and well-nourished.  Neck: No JVD present. No thyromegaly present.  Cardiovascular: Normal rate, regular rhythm, normal heart sounds and intact distal pulses. Exam reveals no gallop and no friction rub.  No murmur heard. Pulmonary/Chest: Effort normal and breath sounds normal. No stridor. No respiratory distress. She has no wheezes. She has no rales.  Abdominal: Soft. Bowel sounds are normal. She exhibits no distension and no mass. There is no tenderness. There is no rebound and no guarding. No hernia.  Musculoskeletal: She exhibits no edema.  Neurological: She is alert and oriented to person, place, and time. She displays normal reflexes. No cranial nerve deficit. She exhibits normal muscle tone. Coordination  normal.  Vitals reviewed.         Assessment & Plan:  Atypical chest pain - Plan: EKG 12-Lead  Other specified hypothyroidism - Plan: TSH  Benign essential HTN - Plan: COMPLETE METABOLIC PANEL WITH GFR, CBC with Differential/Platelet, Lipid panel  Fatigue, unspecified type - Plan: TSH, EKG 12-Lead, Urinalysis, Routine w reflex microscopic  Urinary tract infection without hematuria, site unspecified - Plan: Urine Culture  EKG today is reassuring.  I believe her atypical chest pain was likely  GERD.  I am concerned by her fatigue and her 10 pound weight loss.  I will check a TSH along with a CBC and a CMP.  While checking fasting lab work I will also check a lipid panel.  Urinalysis is abnormal however I will send a urine culture to evaluate for possible urinary tract infection.  I believe the abnormal urinalysis is likely due to vaginal contaminant.  At the present time I believe her fatigue is likely due to decreased oral intake.  Therefore I recommended that she take 1 can of Ensure twice daily.  I recommended 1 can at breakfast and 1 can at supper.  I also encourage that she eat a diet rich in protein to try to improve her energy level.  I will await the results of the lab work prior to making any further changes

## 2017-10-19 ENCOUNTER — Other Ambulatory Visit: Payer: Self-pay | Admitting: Family Medicine

## 2017-10-19 DIAGNOSIS — E860 Dehydration: Secondary | ICD-10-CM

## 2017-10-19 DIAGNOSIS — N289 Disorder of kidney and ureter, unspecified: Secondary | ICD-10-CM

## 2017-10-19 LAB — CBC WITH DIFFERENTIAL/PLATELET
BASOS ABS: 32 {cells}/uL (ref 0–200)
BASOS PCT: 0.5 %
EOS ABS: 109 {cells}/uL (ref 15–500)
Eosinophils Relative: 1.7 %
HEMATOCRIT: 32.4 % — AB (ref 35.0–45.0)
HEMOGLOBIN: 11.1 g/dL — AB (ref 11.7–15.5)
LYMPHS ABS: 1434 {cells}/uL (ref 850–3900)
MCH: 31.4 pg (ref 27.0–33.0)
MCHC: 34.3 g/dL (ref 32.0–36.0)
MCV: 91.5 fL (ref 80.0–100.0)
MPV: 10.5 fL (ref 7.5–12.5)
Monocytes Relative: 10.6 %
NEUTROS ABS: 4147 {cells}/uL (ref 1500–7800)
Neutrophils Relative %: 64.8 %
Platelets: 205 10*3/uL (ref 140–400)
RBC: 3.54 10*6/uL — ABNORMAL LOW (ref 3.80–5.10)
RDW: 12.3 % (ref 11.0–15.0)
Total Lymphocyte: 22.4 %
WBC mixed population: 678 cells/uL (ref 200–950)
WBC: 6.4 10*3/uL (ref 3.8–10.8)

## 2017-10-19 LAB — COMPLETE METABOLIC PANEL WITH GFR
AG RATIO: 1.3 (calc) (ref 1.0–2.5)
ALKALINE PHOSPHATASE (APISO): 156 U/L — AB (ref 33–130)
ALT: 27 U/L (ref 6–29)
AST: 23 U/L (ref 10–35)
Albumin: 3.9 g/dL (ref 3.6–5.1)
BUN/Creatinine Ratio: 17 (calc) (ref 6–22)
BUN: 34 mg/dL — ABNORMAL HIGH (ref 7–25)
CALCIUM: 9.3 mg/dL (ref 8.6–10.4)
CHLORIDE: 99 mmol/L (ref 98–110)
CO2: 25 mmol/L (ref 20–32)
Creat: 2.06 mg/dL — ABNORMAL HIGH (ref 0.60–0.88)
GFR, Est African American: 26 mL/min/{1.73_m2} — ABNORMAL LOW (ref >=60)
GFR, Est Non African American: 22 mL/min/{1.73_m2} — ABNORMAL LOW (ref >=60)
GLOBULIN: 3.1 g/dL (ref 1.9–3.7)
Glucose, Bld: 99 mg/dL (ref 65–99)
POTASSIUM: 3.9 mmol/L (ref 3.5–5.3)
SODIUM: 133 mmol/L — AB (ref 135–146)
Total Bilirubin: 0.7 mg/dL (ref 0.2–1.2)
Total Protein: 7 g/dL (ref 6.1–8.1)

## 2017-10-19 LAB — URINE CULTURE
MICRO NUMBER:: 91146399
SPECIMEN QUALITY:: ADEQUATE

## 2017-10-19 LAB — LIPID PANEL
CHOLESTEROL: 144 mg/dL (ref <200)
HDL: 61 mg/dL (ref >50)
LDL Cholesterol (Calc): 60 mg/dL (calc)
Non-HDL Cholesterol (Calc): 83 mg/dL (calc) (ref <130)
TRIGLYCERIDES: 149 mg/dL (ref <150)
Total CHOL/HDL Ratio: 2.4 (calc) (ref <5.0)

## 2017-10-19 LAB — TSH: TSH: 0.97 mIU/L (ref 0.40–4.50)

## 2017-10-24 ENCOUNTER — Other Ambulatory Visit: Payer: Medicare Other

## 2017-10-24 DIAGNOSIS — N289 Disorder of kidney and ureter, unspecified: Secondary | ICD-10-CM

## 2017-10-24 DIAGNOSIS — E860 Dehydration: Secondary | ICD-10-CM

## 2017-10-24 LAB — BASIC METABOLIC PANEL
BUN / CREAT RATIO: 14 (calc) (ref 6–22)
BUN: 20 mg/dL (ref 7–25)
CO2: 26 mmol/L (ref 20–32)
CREATININE: 1.41 mg/dL — AB (ref 0.60–0.88)
Calcium: 9 mg/dL (ref 8.6–10.4)
Chloride: 93 mmol/L — ABNORMAL LOW (ref 98–110)
GLUCOSE: 97 mg/dL (ref 65–99)
Potassium: 3.9 mmol/L (ref 3.5–5.3)
Sodium: 130 mmol/L — ABNORMAL LOW (ref 135–146)

## 2017-10-26 ENCOUNTER — Other Ambulatory Visit: Payer: Self-pay | Admitting: Family Medicine

## 2017-10-26 DIAGNOSIS — N289 Disorder of kidney and ureter, unspecified: Secondary | ICD-10-CM

## 2017-10-26 DIAGNOSIS — E860 Dehydration: Secondary | ICD-10-CM

## 2017-10-26 MED ORDER — PROMETHAZINE HCL 12.5 MG PO TABS: 12.5000 mg | ORAL_TABLET | Freq: Three times a day (TID) | ORAL | 0 refills | Status: DC | PRN

## 2017-11-02 ENCOUNTER — Other Ambulatory Visit: Payer: Medicare Other

## 2017-11-02 ENCOUNTER — Ambulatory Visit (INDEPENDENT_AMBULATORY_CARE_PROVIDER_SITE_OTHER): Payer: Medicare Other | Admitting: Family Medicine

## 2017-11-02 DIAGNOSIS — E860 Dehydration: Secondary | ICD-10-CM

## 2017-11-02 DIAGNOSIS — Z23 Encounter for immunization: Secondary | ICD-10-CM

## 2017-11-02 DIAGNOSIS — N289 Disorder of kidney and ureter, unspecified: Secondary | ICD-10-CM

## 2017-11-03 LAB — BASIC METABOLIC PANEL
BUN/Creatinine Ratio: 17 (calc) (ref 6–22)
BUN: 26 mg/dL — ABNORMAL HIGH (ref 7–25)
CALCIUM: 9.3 mg/dL (ref 8.6–10.4)
CHLORIDE: 100 mmol/L (ref 98–110)
CO2: 26 mmol/L (ref 20–32)
Creat: 1.56 mg/dL — ABNORMAL HIGH (ref 0.60–0.88)
Glucose, Bld: 98 mg/dL (ref 65–99)
Potassium: 3.9 mmol/L (ref 3.5–5.3)
SODIUM: 135 mmol/L (ref 135–146)

## 2017-11-04 ENCOUNTER — Ambulatory Visit
Admission: RE | Admit: 2017-11-04 | Discharge: 2017-11-04 | Disposition: A | Discharge status: Home / Self Care | Payer: Medicare Other | Source: Ambulatory Visit | Attending: Obstetrics and Gynecology | Admitting: Obstetrics and Gynecology

## 2017-11-04 DIAGNOSIS — Z1231 Encounter for screening mammogram for malignant neoplasm of breast: Secondary | ICD-10-CM

## 2017-11-08 ENCOUNTER — Ambulatory Visit (INDEPENDENT_AMBULATORY_CARE_PROVIDER_SITE_OTHER): Payer: Medicare Other | Admitting: Family Medicine

## 2017-11-08 ENCOUNTER — Encounter: Payer: Self-pay | Admitting: Family Medicine

## 2017-11-08 VITALS — BP 122/68 | HR 61 | Temp 97.6°F | Ht 68.0 in | Wt 152.0 lb

## 2017-11-08 DIAGNOSIS — N184 Chronic kidney disease, stage 4 (severe): Secondary | ICD-10-CM | POA: Diagnosis not present

## 2017-11-08 LAB — URINALYSIS, ROUTINE W REFLEX MICROSCOPIC
BILIRUBIN URINE: NEGATIVE
Bacteria, UA: NONE SEEN /HPF
Glucose, UA: NEGATIVE
HGB URINE DIPSTICK: NEGATIVE
KETONES UR: NEGATIVE
NITRITE: NEGATIVE
Protein, ur: NEGATIVE
RBC / HPF: NONE SEEN /HPF (ref 0–2)
Specific Gravity, Urine: 1.015 (ref 1.001–1.03)
pH: 7 (ref 5.0–8.0)

## 2017-11-08 LAB — MICROSCOPIC MESSAGE

## 2017-11-08 NOTE — Progress Notes (Signed)
Subjective:    Patient ID: Jocelyn Moss, female    DOB: October 09, 1935, 82 y.o.   MRN: [id number]  HPI  10/18/17 Patient presents today for medicine check.  Past medical history is significant for a remote lacunar infarct that was discovered coincidentally during a work-up for memory loss.  For this, the patient is on Plavix for secondary prevention of stroke and is also on Lipitor strong management and secondary prevention of stroke.  Last week, the patient states that she felt extremely weak and tired.  She had one day where she developed a substernal chest discomfort there was approximately 3 on a scale of 1-10.  It occurred at night.  It lasted several hours.  There was no shortness of breath associated with it.  There was no nausea or vomiting.  It was very mild.  She took some Tums over-the-counter and the symptoms improved and have not returned.  She denies any further chest pain, angina, shortness of breath, or dyspnea on exertion however she has felt somewhat weak and tired over the last week.  Her husband is with her today and is concerned because she has not been eating very well.  He states that she has no appetite.  Compared to her weight 2 years ago, the patient has lost 10 pounds.  She states that she frequently only eats one meal a day and that she only picks at.  She is overdue to recheck her cholesterol and her thyroid.  Urinalysis today was obtained to evaluate for possible urinary tract infection versus dehydration.  There is +2 leukocyte Estrace however the patient denies any dysuria, frequency, urgency.  Urine sample was insufficient to allow microscopic analysis however I did send it for urine culture.  EKG today shows normal sinus rhythm with no evidence of ischemia or infarction.  At that time, my plan was: EKG today is reassuring.  I believe her atypical chest pain was likely GERD.  I am concerned by her fatigue and her 10 pound weight loss.  I will check a TSH along with a CBC and  a CMP.  While checking fasting lab work I will also check a lipid panel.  Urinalysis is abnormal however I will send a urine culture to evaluate for possible urinary tract infection.  I believe the abnormal urinalysis is likely due to vaginal contaminant.  At the present time I believe her fatigue is likely due to decreased oral intake.  Therefore I recommended that she take 1 can of Ensure twice daily.  I recommended 1 can at breakfast and 1 can at supper.  I also encourage that she eat a diet rich in protein to try to improve her energy level.  I will await the results of the lab work prior to making any further changes  11/08/17 On patient's lab work, she was found to have an elevated creatinine of 2.06.  Her previous creatinine in May 2018 was 1.08.  Therefore I recommended that she push fluids and recheck her creatinine in a week.  It did improve to 1.46 which was still above her baseline.  We encouraged her to drink additional fluid and recheck in 1 week and her most recent creatinine was 1.56 indicating stage IV chronic kidney disease with a glomerular filtration rate of approximately 26 mL of blood per minute.  She is not taking any NSAIDs.  She is on no nephrotoxic medication.  She denies any fevers or chills or flank pain.  She is drinking 4  to 6 glasses of water or Gatorade a day.  She denies any dysuria urgency or frequency.  She did have +2 leukocyte esterase on her urinalysis last visit but her urine culture was negative for infection. Past Medical History:  Diagnosis Date  . Atrial fibrillation ([place])   . Breast cancer ([place]) 1990  . Chest pain   . Colon cancer ([place])    ileostomy 2000  . History of colon cancer   . Hypercholesterolemia   . Hypothyroidism   . Multiple lacunar infarcts ([place])   . MVP (mitral valve prolapse)   . Osteoporosis    osteopenia  . Personal history of chemotherapy    6 months  . Personal history of radiation therapy    6 months  . SOB (shortness of breath)      Past Surgical History:  Procedure Laterality Date  . BREAST BIOPSY    . BREAST LUMPECTOMY Left   . COLON SURGERY  2000   ileostomy  . ILEOSTOMY     Current Outpatient Medications on File Prior to Visit  Medication Sig Dispense Refill  . atorvastatin (LIPITOR) 20 MG tablet Take 1 tablet (20 mg total) by mouth daily. 90 tablet 3  . clopidogrel (PLAVIX) 75 MG tablet Take 1 tablet (75 mg total) by mouth daily. 90 tablet 3  . DULERA 100-5 MCG/ACT AERO USE 2 INHALATIONS TWICE A DAY 39 g 3  . levothyroxine (SYNTHROID, LEVOTHROID) 100 MCG tablet Take 1 tablet (100 mcg total) by mouth every other day. 45 tablet 3  . levothyroxine (SYNTHROID, LEVOTHROID) 112 MCG tablet Take 1 tablet (112 mcg total) by mouth every other day. 45 tablet 3  . loperamide (IMODIUM) 2 MG capsule TAKE 2 CAPSULES BY MOUTH AS NEEDED FOR DIARRHEA OR LOOSE STOOLS. DO NOT TAKE MORE THAN 4 TABS/DAY 30 capsule 2  . metoprolol tartrate (LOPRESSOR) 25 MG tablet TAKE ONE TABLET BY MOUTH TWICE DAILY (Patient taking differently: Patient taking 1 daily) 180 tablet 3  . promethazine (PHENERGAN) 12.5 MG tablet Take 1 tablet (12.5 mg total) by mouth every 8 (eight) hours as needed for nausea or vomiting. 20 tablet 0   No current facility-administered medications on file prior to visit.    Allergies  Allergen Reactions  . Crestor [Rosuvastatin Calcium] Other (See Comments)    Joint pain, effected liver function   . Rosuvastatin Other (See Comments)    Joint pain, effected liver function  . Zetia [Ezetimibe] Other (See Comments)    Joint pain, effected liver function    Social History   Socioeconomic History  . Marital status: Married    Spouse name: Not on file  . Number of children: Not on file  . Years of education: Not on file  . Highest education level: Not on file  Occupational History  . Not on file  Social Needs  . Financial resource strain: Not on file  . Food insecurity:    Worry: Not on file    Inability:  Not on file  . Transportation needs:    Medical: Not on file    Non-medical: Not on file  Tobacco Use  . Smoking status: Never Smoker  . Smokeless tobacco: Never Used  Substance and Sexual Activity  . Alcohol use: No  . Drug use: No  . Sexual activity: Not on file  Lifestyle  . Physical activity:    Days per week: Not on file    Minutes per session: Not on file  . Stress: Not on file  Relationships  . Social connections:    Talks on phone: Not on file    Gets together: Not on file    Attends religious service: Not on file    Active member of club or organization: Not on file    Attends meetings of clubs or organizations: Not on file    Relationship status: Not on file  . Intimate partner violence:    Fear of current or ex partner: Not on file    Emotionally abused: Not on file    Physically abused: Not on file    Forced sexual activity: Not on file  Other Topics Concern  . Not on file  Social History Narrative  . Not on file      Review of Systems  All other systems reviewed and are negative.      Objective:   Physical Exam  Constitutional: She is oriented to person, place, and time. She appears well-developed and well-nourished.  Neck: No JVD present. No thyromegaly present.  Cardiovascular: Normal rate, regular rhythm, normal heart sounds and intact distal pulses. Exam reveals no gallop and no friction rub.  No murmur heard. Pulmonary/Chest: Effort normal and breath sounds normal. No stridor. No respiratory distress. She has no wheezes. She has no rales.  Abdominal: Soft. Bowel sounds are normal. She exhibits no distension and no mass. There is no tenderness. There is no rebound and no guarding. No hernia.  Musculoskeletal: She exhibits no edema.  Neurological: She is alert and oriented to person, place, and time. She displays normal reflexes. No cranial nerve deficit. She exhibits normal muscle tone. Coordination normal.  Vitals reviewed.          Assessment & Plan:  CKD (chronic kidney disease) stage 4, GFR 15-29 ml/min (HCC) - Plan: Urinalysis, Routine w reflex microscopic, US Renal  Patient appears to have stage IV chronic kidney disease.  I will obtain a renal ultrasound to evaluate further for any evidence of hydronephrosis, renal mass, etc.  I will repeat urinalysis to evaluate for any cellular casts or evidence of glomerulonephritis or interstitial nephritis.  I suspect that the patient likely has underlying chronic kidney disease due to age and her medical history and that recently this is worsened.  Therefore if no reversible cause or treatable causes found I have recommended that we monitor her kidney test every 3 to 4 months and likely refer her to a nephrologist based on the results of the urinalysis and renal ultrasound.

## 2017-11-10 ENCOUNTER — Other Ambulatory Visit: Payer: Self-pay | Admitting: Family Medicine

## 2017-11-10 ENCOUNTER — Ambulatory Visit
Admission: RE | Admit: 2017-11-10 | Discharge: 2017-11-10 | Disposition: A | Discharge status: Home / Self Care | Payer: Medicare Other | Source: Ambulatory Visit | Attending: Family Medicine | Admitting: Family Medicine

## 2017-11-10 DIAGNOSIS — N2889 Other specified disorders of kidney and ureter: Secondary | ICD-10-CM

## 2017-11-10 DIAGNOSIS — N184 Chronic kidney disease, stage 4 (severe): Secondary | ICD-10-CM

## 2017-11-14 ENCOUNTER — Other Ambulatory Visit: Payer: Self-pay | Admitting: Family Medicine

## 2017-11-14 ENCOUNTER — Telehealth: Payer: Self-pay | Admitting: Family Medicine

## 2017-11-14 DIAGNOSIS — N2889 Other specified disorders of kidney and ureter: Secondary | ICD-10-CM

## 2017-11-14 NOTE — Telephone Encounter (Signed)
Patient's daughter called in asking more about the MRI scheduled because patient was a little unsure of some of the details. Her daughter also wanted to know if you feel with patient's current kidney functions if she needs to continue to drink Glucerna drinks due to high levels of proteins. She stated that she drinks two a day and she noticed that the Glucerna has more protein than regular ensure. Also she asked if this were to be a cancerous mass just as an estimation how soon did you think it would be before surgery within a matter of weeks or months. They also had questions on how soon would they get the results since the MRI is scheduled for the afternoon on Friday. Please advise?

## 2017-11-14 NOTE — Telephone Encounter (Signed)
I feel it is okay to stop Glucerna at this point.  MRI results should be back by Monday if it is done Friday.  Need the results of the MRI before decision can be made regarding surgery

## 2017-11-15 NOTE — Telephone Encounter (Signed)
Spoke with patient's daughter and informed her of Dr. Samella Parr recommendations. She verbalized understanding.

## 2017-11-18 ENCOUNTER — Ambulatory Visit (HOSPITAL_COMMUNITY)
Admission: RE | Admit: 2017-11-18 | Discharge: 2017-11-18 | Disposition: A | Discharge status: Home / Self Care | Payer: Medicare Other | Source: Ambulatory Visit | Attending: Family Medicine | Admitting: Family Medicine

## 2017-11-18 DIAGNOSIS — N2889 Other specified disorders of kidney and ureter: Secondary | ICD-10-CM | POA: Insufficient documentation

## 2017-11-18 MED ORDER — GADOBUTROL 1 MMOL/ML IV SOLN
6.7000 mL | Freq: Once | INTRAVENOUS | Status: AC | PRN
  Administered 2017-11-18: 6.7 mL via INTRAVENOUS

## 2017-11-20 ENCOUNTER — Other Ambulatory Visit: Payer: Medicare Other

## 2017-11-21 ENCOUNTER — Other Ambulatory Visit: Payer: Self-pay | Admitting: Family Medicine

## 2017-11-21 DIAGNOSIS — N2889 Other specified disorders of kidney and ureter: Secondary | ICD-10-CM

## 2017-11-22 ENCOUNTER — Telehealth: Payer: Self-pay | Admitting: Family Medicine

## 2017-11-22 NOTE — Telephone Encounter (Signed)
Patient's daughter Levada Dy called to inform us that [facility] got her scheduled for 12/13/17 she wanted to make sure it was ok to wait 3 weeks. I called yesterday to try and get patient scheduled and was told that they had to let the doctors review the office notes and MRI and the providers would tell them when the patient can be scheduled. Patient's daughter also called and stated that she would like to know if you could personally call patient and give her the results because she relayed the results to the patient yesterday but the patient informed her that she would like to hear it from you personally. Levada Dy also asked if we could not tell her mom that she called today as her mom does not know that she has been calling.

## 2017-11-25 NOTE — Telephone Encounter (Signed)
Patient's Daughter Jocelyn Moss came in today for a flu vaccine and wanted to follow up on this phone call. They would still like to know if you feel the 19th of this month is ok to wait to see the urologist. Please advise?

## 2017-11-25 NOTE — Telephone Encounter (Signed)
They do not. When I called to schedule the appointment they wouldn't let me do it. They had to let the provider review the case first and then he told them where to put the patient on the schedule.

## 2017-11-25 NOTE — Telephone Encounter (Signed)
Yes, if that is their first available appointment. Do they have anything sooner?

## 2017-11-25 NOTE — Telephone Encounter (Signed)
Thank you, please explain to daughter that the surgeon determined the appointment.  Two weeks should not cause a problem.

## 2017-11-28 ENCOUNTER — Other Ambulatory Visit: Payer: Self-pay | Admitting: Family Medicine

## 2017-11-28 DIAGNOSIS — E038 Other specified hypothyroidism: Secondary | ICD-10-CM

## 2017-11-28 NOTE — Telephone Encounter (Signed)
Spoke with patient and daughter and informed them that the surgeon determined appointment. They both verbalized understanding.

## 2017-12-05 ENCOUNTER — Other Ambulatory Visit: Payer: Self-pay | Admitting: Family Medicine

## 2017-12-05 DIAGNOSIS — I6930 Unspecified sequelae of cerebral infarction: Secondary | ICD-10-CM

## 2017-12-05 DIAGNOSIS — E038 Other specified hypothyroidism: Secondary | ICD-10-CM

## 2017-12-26 ENCOUNTER — Other Ambulatory Visit: Payer: Self-pay | Admitting: Urology

## 2018-01-24 NOTE — Patient Instructions (Addendum)
NISA DECAIRE  01/24/2018   Your procedure is scheduled on: 02-01-18    Report to [facility] Main  Entrance    Report to Admitting at 10:00 AM    Call this number if you have problems the morning of surgery [phone]     CLEAR LIQUID DIET   Foods Allowed                                                                     Foods Excluded  Coffee and tea, regular and decaf                             liquids that you cannot  Plain Jell-O in any flavor                                             see through such as: Fruit ices (not with fruit pulp)                                     milk, soups, orange juice  Iced Popsicles                                    All solid food Carbonated beverages, regular and diet                                    Cranberry, grape and apple juices Sports drinks like Gatorade Lightly seasoned clear broth or consume(fat free) Sugar, honey syrup  Sample Menu Breakfast                                Lunch                                     Supper Cranberry juice                    Beef broth                            Chicken broth Jell-O                                     Grape juice                           Apple juice Coffee or tea  Jell-O                                      Popsicle                                                Coffee or tea                        Coffee or tea  _____________________________________________________________________    Remember: Do not eat food or drink liquids :After Midnight.    BRUSH YOUR TEETH MORNING OF SURGERY AND RINSE YOUR MOUTH OUT, NO CHEWING GUM CANDY OR MINTS.     Take these medicines the morning of surgery with A SIP OF WATER: Levothyroxine, Metoprolol (Lopressor), Atorvastatin . You may use and bring your inhaler as needed.                                 You may not have any metal on your body including hair pins and   piercings  Do not wear jewelry, make-up, lotions, powders or perfumes, deodorant             Do not wear nail polish.  Do not shave  48 hours prior to surgery.     Do not bring valuables to the hospital. [place].  Contacts, dentures or bridgework may not be worn into surgery.  Leave suitcase in the car. After surgery it may be brought to your room.     Special Instructions: Please consume a Clear Liquid Diet the Day prior to surgery, per your MD's instructions             Please read over the following fact sheets you were given: _____________________________________________________________________           [facility] - Preparing for Surgery Before surgery, you can play an important role.  Because skin is not sterile, your skin needs to be as free of germs as possible.  You can reduce the number of germs on your skin by washing with CHG (chlorahexidine gluconate) soap before surgery.  CHG is an antiseptic cleaner which kills germs and bonds with the skin to continue killing germs even after washing. Please DO NOT use if you have an allergy to CHG or antibacterial soaps.  If your skin becomes reddened/irritated stop using the CHG and inform your nurse when you arrive at Short Stay. Do not shave (including legs and underarms) for at least 48 hours prior to the first CHG shower.  You may shave your face/neck. Please follow these instructions carefully:  1.  Shower with CHG Soap the night before surgery and the  morning of Surgery.  2.  If you choose to wash your hair, wash your hair first as usual with your  normal  shampoo.  3.  After you shampoo, rinse your hair and body thoroughly to remove the  shampoo.                           4.  Use CHG as  you would any other liquid soap.  You can apply chg directly  to the skin and wash                       Gently with a scrungie or clean washcloth.  5.  Apply the CHG Soap to your body ONLY FROM THE  NECK DOWN.   Do not use on face/ open                           Wound or open sores. Avoid contact with eyes, ears mouth and genitals (private parts).                       Wash face,  Genitals (private parts) with your normal soap.             6.  Wash thoroughly, paying special attention to the area where your surgery  will be performed.  7.  Thoroughly rinse your body with warm water from the neck down.  8.  DO NOT shower/wash with your normal soap after using and rinsing off  the CHG Soap.                9.  Pat yourself dry with a clean towel.            10.  Wear clean pajamas.            11.  Place clean sheets on your bed the night of your first shower and do not  sleep with pets. Day of Surgery : Do not apply any lotions/deodorants the morning of surgery.  Please wear clean clothes to the hospital/surgery center.  FAILURE TO FOLLOW THESE INSTRUCTIONS MAY RESULT IN THE CANCELLATION OF YOUR SURGERY PATIENT SIGNATURE_________________________________  NURSE SIGNATURE__________________________________  ________________________________________________________________________

## 2018-01-24 NOTE — Progress Notes (Signed)
10-18-17 (Epic) EKG

## 2018-01-27 ENCOUNTER — Other Ambulatory Visit: Payer: Self-pay

## 2018-01-27 ENCOUNTER — Encounter (HOSPITAL_COMMUNITY): Payer: Self-pay | Admitting: *Deleted

## 2018-01-27 ENCOUNTER — Encounter (HOSPITAL_COMMUNITY)
Admission: RE | Admit: 2018-01-27 | Discharge: 2018-01-27 | Disposition: A | Discharge status: Home / Self Care | Payer: Medicare Other | Source: Ambulatory Visit | Attending: Urology | Admitting: Urology

## 2018-01-27 DIAGNOSIS — Z01812 Encounter for preprocedural laboratory examination: Secondary | ICD-10-CM | POA: Insufficient documentation

## 2018-01-27 HISTORY — DX: Cardiac arrhythmia, unspecified: I49.9

## 2018-01-27 LAB — CBC
HCT: 37.8 % (ref 36.0–46.0)
Hemoglobin: 11.7 g/dL — ABNORMAL LOW (ref 12.0–15.0)
MCH: 29.8 pg (ref 26.0–34.0)
MCHC: 31 g/dL (ref 30.0–36.0)
MCV: 96.2 fL (ref 80.0–100.0)
Platelets: 211 10*3/uL (ref 150–400)
RBC: 3.93 MIL/uL (ref 3.87–5.11)
RDW: 12.5 % (ref 11.5–15.5)
WBC: 7.6 10*3/uL (ref 4.0–10.5)
nRBC: 0 % (ref 0.0–0.2)

## 2018-01-27 LAB — BASIC METABOLIC PANEL
Anion gap: 9 (ref 5–15)
BUN: 23 mg/dL (ref 8–23)
CHLORIDE: 102 mmol/L (ref 98–111)
CO2: 25 mmol/L (ref 22–32)
Calcium: 9.2 mg/dL (ref 8.9–10.3)
Creatinine, Ser: 1.32 mg/dL — ABNORMAL HIGH (ref 0.44–1.00)
GFR calc Af Amer: 44 mL/min — ABNORMAL LOW (ref >60)
GFR calc non Af Amer: 38 mL/min — ABNORMAL LOW (ref >60)
Glucose, Bld: 78 mg/dL (ref 70–99)
Potassium: 3.9 mmol/L (ref 3.5–5.1)
Sodium: 136 mmol/L (ref 135–145)

## 2018-02-01 ENCOUNTER — Observation Stay (HOSPITAL_COMMUNITY)
Admission: RE | Admit: 2018-02-01 | Discharge: 2018-02-03 | Disposition: A | Discharge status: Home / Self Care | Payer: Medicare Other | Attending: Urology | Admitting: Urology

## 2018-02-01 ENCOUNTER — Encounter (HOSPITAL_COMMUNITY)
Admission: RE | Disposition: A | Discharge status: Home / Self Care | Payer: Self-pay | Source: Home / Self Care | Attending: Urology

## 2018-02-01 ENCOUNTER — Encounter (HOSPITAL_COMMUNITY): Payer: Self-pay | Admitting: Emergency Medicine

## 2018-02-01 ENCOUNTER — Other Ambulatory Visit: Payer: Self-pay

## 2018-02-01 ENCOUNTER — Ambulatory Visit (HOSPITAL_COMMUNITY): Payer: Medicare Other | Admitting: Physician Assistant

## 2018-02-01 ENCOUNTER — Ambulatory Visit (HOSPITAL_COMMUNITY): Payer: Medicare Other | Admitting: Certified Registered Nurse Anesthetist

## 2018-02-01 DIAGNOSIS — N2889 Other specified disorders of kidney and ureter: Secondary | ICD-10-CM | POA: Diagnosis present

## 2018-02-01 DIAGNOSIS — Z8673 Personal history of transient ischemic attack (TIA), and cerebral infarction without residual deficits: Secondary | ICD-10-CM | POA: Diagnosis not present

## 2018-02-01 DIAGNOSIS — Z853 Personal history of malignant neoplasm of breast: Secondary | ICD-10-CM | POA: Diagnosis not present

## 2018-02-01 DIAGNOSIS — Z923 Personal history of irradiation: Secondary | ICD-10-CM | POA: Insufficient documentation

## 2018-02-01 DIAGNOSIS — Z9221 Personal history of antineoplastic chemotherapy: Secondary | ICD-10-CM | POA: Insufficient documentation

## 2018-02-01 DIAGNOSIS — Z932 Ileostomy status: Secondary | ICD-10-CM | POA: Diagnosis not present

## 2018-02-01 DIAGNOSIS — Z9049 Acquired absence of other specified parts of digestive tract: Secondary | ICD-10-CM | POA: Insufficient documentation

## 2018-02-01 DIAGNOSIS — Z85038 Personal history of other malignant neoplasm of large intestine: Secondary | ICD-10-CM | POA: Insufficient documentation

## 2018-02-01 DIAGNOSIS — C641 Malignant neoplasm of right kidney, except renal pelvis: Secondary | ICD-10-CM | POA: Diagnosis not present

## 2018-02-01 DIAGNOSIS — N184 Chronic kidney disease, stage 4 (severe): Secondary | ICD-10-CM | POA: Insufficient documentation

## 2018-02-01 DIAGNOSIS — Z7902 Long term (current) use of antithrombotics/antiplatelets: Secondary | ICD-10-CM | POA: Diagnosis not present

## 2018-02-01 DIAGNOSIS — I4891 Unspecified atrial fibrillation: Secondary | ICD-10-CM | POA: Insufficient documentation

## 2018-02-01 HISTORY — PX: ROBOTIC ASSITED PARTIAL NEPHRECTOMY: SHX6087

## 2018-02-01 LAB — ABO/RH: ABO/RH(D): O NEG

## 2018-02-01 LAB — TYPE AND SCREEN
ABO/RH(D): O NEG
ANTIBODY SCREEN: NEGATIVE

## 2018-02-01 LAB — HEMOGLOBIN AND HEMATOCRIT, BLOOD
HCT: 36.3 % (ref 36.0–46.0)
Hemoglobin: 11.4 g/dL — ABNORMAL LOW (ref 12.0–15.0)

## 2018-02-01 SURGERY — NEPHRECTOMY, PARTIAL, ROBOT-ASSISTED
Anesthesia: General | Laterality: Right

## 2018-02-01 MED ORDER — CEFAZOLIN SODIUM-DEXTROSE 2-4 GM/100ML-% IV SOLN
INTRAVENOUS | Status: AC
  Filled 2018-02-01: qty 100

## 2018-02-01 MED ORDER — LIDOCAINE 2% (20 MG/ML) 5 ML SYRINGE
INTRAMUSCULAR | Status: AC
  Filled 2018-02-01: qty 5

## 2018-02-01 MED ORDER — SODIUM CHLORIDE (PF) 0.9 % IJ SOLN
INTRAMUSCULAR | Status: DC | PRN
  Administered 2018-02-01: 20 mL

## 2018-02-01 MED ORDER — LIDOCAINE 2% (20 MG/ML) 5 ML SYRINGE
INTRAMUSCULAR | Status: DC | PRN
Start: 2018-02-01 — End: 2018-02-01
  Administered 2018-02-01: 60 mg via INTRAVENOUS

## 2018-02-01 MED ORDER — DIPHENHYDRAMINE HCL 50 MG/ML IJ SOLN: 12.5000 mg | Freq: Four times a day (QID) | INTRAMUSCULAR | Status: DC | PRN

## 2018-02-01 MED ORDER — ONDANSETRON HCL 4 MG/2ML IJ SOLN
INTRAMUSCULAR | Status: AC
  Filled 2018-02-01: qty 2

## 2018-02-01 MED ORDER — METOPROLOL TARTRATE 5 MG/5ML IV SOLN
INTRAVENOUS | Status: DC | PRN
  Administered 2018-02-01 (×2): 1 mg via INTRAVENOUS

## 2018-02-01 MED ORDER — ATORVASTATIN CALCIUM 20 MG PO TABS
20.0000 mg | ORAL_TABLET | Freq: Every day | ORAL | Status: DC
  Administered 2018-02-02 – 2018-02-03 (×2): 20 mg via ORAL
  Filled 2018-02-01 (×2): qty 1

## 2018-02-01 MED ORDER — BELLADONNA ALKALOIDS-OPIUM 16.2-60 MG RE SUPP: 1.0000 | Freq: Four times a day (QID) | RECTAL | Status: DC | PRN

## 2018-02-01 MED ORDER — FENTANYL CITRATE (PF) 100 MCG/2ML IJ SOLN
INTRAMUSCULAR | Status: AC
  Filled 2018-02-01: qty 4

## 2018-02-01 MED ORDER — EPHEDRINE 5 MG/ML INJ
INTRAVENOUS | Status: AC
  Filled 2018-02-01: qty 10

## 2018-02-01 MED ORDER — ACETAMINOPHEN 10 MG/ML IV SOLN
1000.0000 mg | Freq: Four times a day (QID) | INTRAVENOUS | Status: AC
  Administered 2018-02-01 – 2018-02-02 (×4): 1000 mg via INTRAVENOUS
  Filled 2018-02-01 (×4): qty 100

## 2018-02-01 MED ORDER — FENTANYL CITRATE (PF) 250 MCG/5ML IJ SOLN
INTRAMUSCULAR | Status: AC
  Filled 2018-02-01: qty 5

## 2018-02-01 MED ORDER — SUGAMMADEX SODIUM 200 MG/2ML IV SOLN
INTRAVENOUS | Status: AC
  Filled 2018-02-01: qty 2

## 2018-02-01 MED ORDER — OXYCODONE HCL 5 MG PO TABS
5.0000 mg | ORAL_TABLET | ORAL | Status: DC | PRN
  Administered 2018-02-03: 5 mg via ORAL
  Filled 2018-02-01: qty 1

## 2018-02-01 MED ORDER — OXYCODONE HCL 5 MG/5ML PO SOLN
5.0000 mg | Freq: Once | ORAL | Status: DC | PRN
  Filled 2018-02-01: qty 5

## 2018-02-01 MED ORDER — MOMETASONE FURO-FORMOTEROL FUM 100-5 MCG/ACT IN AERO
2.0000 | INHALATION_SPRAY | Freq: Two times a day (BID) | RESPIRATORY_TRACT | Status: DC
  Administered 2018-02-03: 2 via RESPIRATORY_TRACT
  Filled 2018-02-01 (×2): qty 8.8

## 2018-02-01 MED ORDER — BUPIVACAINE LIPOSOME 1.3 % IJ SUSP
20.0000 mL | Freq: Once | INTRAMUSCULAR | Status: AC
  Administered 2018-02-01: 20 mL
  Filled 2018-02-01: qty 20

## 2018-02-01 MED ORDER — PHENYLEPHRINE 40 MCG/ML (10ML) SYRINGE FOR IV PUSH (FOR BLOOD PRESSURE SUPPORT)
PREFILLED_SYRINGE | INTRAVENOUS | Status: DC | PRN
Start: 2018-02-01 — End: 2018-02-01
  Administered 2018-02-01: 80 ug via INTRAVENOUS
  Administered 2018-02-01: 40 ug via INTRAVENOUS

## 2018-02-01 MED ORDER — ONDANSETRON HCL 4 MG/2ML IJ SOLN
4.0000 mg | INTRAMUSCULAR | Status: DC | PRN
  Administered 2018-02-02: 4 mg via INTRAVENOUS
  Filled 2018-02-01: qty 2

## 2018-02-01 MED ORDER — PROPOFOL 10 MG/ML IV BOLUS
INTRAVENOUS | Status: AC
  Filled 2018-02-01: qty 20

## 2018-02-01 MED ORDER — SUGAMMADEX SODIUM 200 MG/2ML IV SOLN
INTRAVENOUS | Status: DC | PRN
  Administered 2018-02-01: 200 mg via INTRAVENOUS

## 2018-02-01 MED ORDER — EPHEDRINE SULFATE-NACL 50-0.9 MG/10ML-% IV SOSY
PREFILLED_SYRINGE | INTRAVENOUS | Status: DC | PRN
  Administered 2018-02-01: 10 mg via INTRAVENOUS
  Administered 2018-02-01: 5 mg via INTRAVENOUS

## 2018-02-01 MED ORDER — METOPROLOL TARTRATE 5 MG/5ML IV SOLN
INTRAVENOUS | Status: AC
  Filled 2018-02-01: qty 5

## 2018-02-01 MED ORDER — ROCURONIUM BROMIDE 50 MG/5ML IV SOSY
PREFILLED_SYRINGE | INTRAVENOUS | Status: DC | PRN
  Administered 2018-02-01: 40 mg via INTRAVENOUS
  Administered 2018-02-01: 10 mg via INTRAVENOUS

## 2018-02-01 MED ORDER — PROPOFOL 10 MG/ML IV BOLUS
INTRAVENOUS | Status: DC | PRN
  Administered 2018-02-01: 100 mg via INTRAVENOUS
  Administered 2018-02-01 (×2): 50 mg via INTRAVENOUS

## 2018-02-01 MED ORDER — CEFAZOLIN SODIUM-DEXTROSE 2-4 GM/100ML-% IV SOLN
2.0000 g | Freq: Once | INTRAVENOUS | Status: AC
  Administered 2018-02-01: 2 g via INTRAVENOUS

## 2018-02-01 MED ORDER — METOPROLOL TARTRATE 25 MG PO TABS
12.5000 mg | ORAL_TABLET | Freq: Two times a day (BID) | ORAL | Status: DC
  Administered 2018-02-01 – 2018-02-03 (×4): 12.5 mg via ORAL
  Filled 2018-02-01 (×4): qty 1

## 2018-02-01 MED ORDER — SODIUM CHLORIDE (PF) 0.9 % IJ SOLN
INTRAMUSCULAR | Status: AC
  Filled 2018-02-01: qty 20

## 2018-02-01 MED ORDER — DEXTROSE-NACL 5-0.45 % IV SOLN
INTRAVENOUS | Status: DC
  Administered 2018-02-01 – 2018-02-02 (×2): via INTRAVENOUS

## 2018-02-01 MED ORDER — CEFAZOLIN (ANCEF) 1 G IV SOLR: 2.0000 g | INTRAVENOUS | Status: DC

## 2018-02-01 MED ORDER — DIPHENHYDRAMINE HCL 12.5 MG/5ML PO ELIX: 12.5000 mg | ORAL_SOLUTION | Freq: Four times a day (QID) | ORAL | Status: DC | PRN

## 2018-02-01 MED ORDER — LEVOTHYROXINE SODIUM 112 MCG PO TABS
112.0000 ug | ORAL_TABLET | ORAL | Status: DC
  Administered 2018-02-03: 112 ug via ORAL
  Filled 2018-02-01: qty 1

## 2018-02-01 MED ORDER — HYDROMORPHONE HCL 1 MG/ML IJ SOLN
0.5000 mg | INTRAMUSCULAR | Status: DC | PRN
  Administered 2018-02-01 – 2018-02-02 (×2): 1 mg via INTRAVENOUS
  Filled 2018-02-01 (×2): qty 1

## 2018-02-01 MED ORDER — FENTANYL CITRATE (PF) 100 MCG/2ML IJ SOLN
25.0000 ug | INTRAMUSCULAR | Status: DC | PRN
  Administered 2018-02-01 (×2): 50 ug via INTRAVENOUS

## 2018-02-01 MED ORDER — PHENYLEPHRINE 40 MCG/ML (10ML) SYRINGE FOR IV PUSH (FOR BLOOD PRESSURE SUPPORT)
PREFILLED_SYRINGE | INTRAVENOUS | Status: AC
  Filled 2018-02-01: qty 10

## 2018-02-01 MED ORDER — HYDROCODONE-ACETAMINOPHEN 5-325 MG PO TABS: 1.0000 | ORAL_TABLET | Freq: Four times a day (QID) | ORAL | 0 refills | Status: DC | PRN

## 2018-02-01 MED ORDER — ONDANSETRON HCL 4 MG/2ML IJ SOLN: 4.0000 mg | Freq: Once | INTRAMUSCULAR | Status: DC | PRN

## 2018-02-01 MED ORDER — DEXAMETHASONE SODIUM PHOSPHATE 10 MG/ML IJ SOLN
INTRAMUSCULAR | Status: DC | PRN
  Administered 2018-02-01: 10 mg via INTRAVENOUS

## 2018-02-01 MED ORDER — FENTANYL CITRATE (PF) 100 MCG/2ML IJ SOLN
INTRAMUSCULAR | Status: DC | PRN
  Administered 2018-02-01 (×5): 50 ug via INTRAVENOUS

## 2018-02-01 MED ORDER — LEVOTHYROXINE SODIUM 100 MCG PO TABS
100.0000 ug | ORAL_TABLET | ORAL | Status: DC
  Administered 2018-02-02: 100 ug via ORAL
  Filled 2018-02-01: qty 1

## 2018-02-01 MED ORDER — ROCURONIUM BROMIDE 10 MG/ML (PF) SYRINGE
PREFILLED_SYRINGE | INTRAVENOUS | Status: AC
  Filled 2018-02-01: qty 10

## 2018-02-01 MED ORDER — OXYCODONE HCL 5 MG PO TABS: 5.0000 mg | ORAL_TABLET | Freq: Once | ORAL | Status: DC | PRN

## 2018-02-01 MED ORDER — LACTATED RINGERS IV SOLN
INTRAVENOUS | Status: DC
  Administered 2018-02-01 (×2): via INTRAVENOUS

## 2018-02-01 SURGICAL SUPPLY — 78 items
ADH SKN CLS APL DERMABOND .7 (GAUZE/BANDAGES/DRESSINGS) ×1
AGENT HMST KT MTR STRL THRMB (HEMOSTASIS) ×1
APL ESCP 34 STRL LF DISP (HEMOSTASIS) ×1
APPLICATOR SURGIFLO ENDO (HEMOSTASIS) ×2 IMPLANT
BAG SPEC RTRVL LRG 6X4 10 (ENDOMECHANICALS) ×1
CHLORAPREP W/TINT 26ML (MISCELLANEOUS) ×2 IMPLANT
CLIP SUT LAPRA TY ABSORB (SUTURE) ×4 IMPLANT
CLIP VESOLOCK LG 6/CT PURPLE (CLIP) ×2 IMPLANT
CLIP VESOLOCK MED LG 6/CT (CLIP) ×5 IMPLANT
CLIP VESOLOCK XL 6/CT (CLIP) IMPLANT
COVER SURGICAL LIGHT HANDLE (MISCELLANEOUS) ×2 IMPLANT
COVER TIP SHEARS 8 DVNC (MISCELLANEOUS) ×1 IMPLANT
COVER TIP SHEARS 8MM DA VINCI (MISCELLANEOUS) ×1
COVER WAND RF STERILE (DRAPES) IMPLANT
DECANTER SPIKE VIAL GLASS SM (MISCELLANEOUS) ×2 IMPLANT
DERMABOND ADVANCED (GAUZE/BANDAGES/DRESSINGS) ×1
DERMABOND ADVANCED .7 DNX12 (GAUZE/BANDAGES/DRESSINGS) ×1 IMPLANT
DISSECT BALLN SPACEMKR + OVL (BALLOONS) ×2
DISSECTOR BALLN SPACEMKR + OVL (BALLOONS) IMPLANT
DRAIN CHANNEL 15F RND FF 3/16 (WOUND CARE) ×2 IMPLANT
DRAPE ARM DVNC X/XI (DISPOSABLE) ×4 IMPLANT
DRAPE COLUMN DVNC XI (DISPOSABLE) ×1 IMPLANT
DRAPE DA VINCI XI ARM (DISPOSABLE) ×4
DRAPE DA VINCI XI COLUMN (DISPOSABLE) ×1
DRAPE INCISE IOBAN 66X45 STRL (DRAPES) ×2 IMPLANT
DRAPE SHEET LG 3/4 BI-LAMINATE (DRAPES) ×2 IMPLANT
ELECT PENCIL ROCKER SW 15FT (MISCELLANEOUS) ×2 IMPLANT
ELECT REM PT RETURN 15FT ADLT (MISCELLANEOUS) ×2 IMPLANT
EVACUATOR SILICONE 100CC (DRAIN) ×1 IMPLANT
GAUZE SPONGE 4X4 12PLY STRL (GAUZE/BANDAGES/DRESSINGS) ×1 IMPLANT
GLOVE BIO SURGEON STRL SZ 6.5 (GLOVE) ×2 IMPLANT
GLOVE BIOGEL M STRL SZ7.5 (GLOVE) ×5 IMPLANT
GOWN STRL REUS W/TWL LRG LVL3 (GOWN DISPOSABLE) ×6 IMPLANT
HEMOSTAT SURGICEL 4X8 (HEMOSTASIS) ×2 IMPLANT
HOLDER FOLEY CATH W/STRAP (MISCELLANEOUS) ×1 IMPLANT
IRRIG SUCT STRYKERFLOW 2 WTIP (MISCELLANEOUS) ×4
IRRIGATION SUCT STRKRFLW 2 WTP (MISCELLANEOUS) ×1 IMPLANT
IV LACTATED RINGERS 1000ML (IV SOLUTION) ×1 IMPLANT
KIT BASIN OR (CUSTOM PROCEDURE TRAY) ×2 IMPLANT
LOOP VESSEL MAXI BLUE (MISCELLANEOUS) ×2 IMPLANT
MARKER SKIN DUAL TIP RULER LAB (MISCELLANEOUS) ×2 IMPLANT
NDL INSUFFLATION 14GA 120MM (NEEDLE) ×1 IMPLANT
NEEDLE INSUFFLATION 14GA 120MM (NEEDLE) ×2 IMPLANT
NS IRRIG 1000ML POUR BTL (IV SOLUTION) ×1 IMPLANT
PORT ACCESS TROCAR AIRSEAL 12 (TROCAR) ×1 IMPLANT
PORT ACCESS TROCAR AIRSEAL 5M (TROCAR) ×1
POUCH SPECIMEN RETRIEVAL 10MM (ENDOMECHANICALS) ×2 IMPLANT
PROTECTOR NERVE ULNAR (MISCELLANEOUS) ×5 IMPLANT
RELOAD STAPLE 60 2.6 WHT THN (STAPLE) IMPLANT
RELOAD STAPLER WHITE 60MM (STAPLE) IMPLANT
SEAL CANN UNIV 5-8 DVNC XI (MISCELLANEOUS) ×4 IMPLANT
SEAL XI 5MM-8MM UNIVERSAL (MISCELLANEOUS) ×4
SET TRI-LUMEN FLTR TB AIRSEAL (TUBING) ×2 IMPLANT
SOL PREP POV-IOD 16OZ 10% (MISCELLANEOUS) ×1 IMPLANT
SOLUTION ELECTROLUBE (MISCELLANEOUS) ×2 IMPLANT
SPONGE LAP 4X18 RFD (DISPOSABLE) ×2 IMPLANT
STAPLE ECHEON FLEX 60 POW ENDO (STAPLE) IMPLANT
STAPLER RELOAD WHITE 60MM (STAPLE)
SURGIFLO W/THROMBIN 8M KIT (HEMOSTASIS) ×2 IMPLANT
SUT ETHILON 3 0 PS 1 (SUTURE) ×2 IMPLANT
SUT MNCRL AB 4-0 PS2 18 (SUTURE) ×4 IMPLANT
SUT PDS AB 1 CT1 27 (SUTURE) ×4 IMPLANT
SUT V-LOC BARB 180 2/0GR6 GS22 (SUTURE)
SUT VIC AB 0 CT1 27 (SUTURE) ×8
SUT VIC AB 0 CT1 27XBRD ANTBC (SUTURE) ×4 IMPLANT
SUT VIC AB 2-0 SH 27 (SUTURE) ×4
SUT VIC AB 2-0 SH 27X BRD (SUTURE) ×2 IMPLANT
SUT VLOC BARB 180 ABS3/0GR12 (SUTURE) ×4
SUTURE V-LC BRB 180 2/0GR6GS22 (SUTURE) IMPLANT
SUTURE VLOC BRB 180 ABS3/0GR12 (SUTURE) ×1 IMPLANT
TOWEL OR 17X26 10 PK STRL BLUE (TOWEL DISPOSABLE) ×3 IMPLANT
TOWEL OR NON WOVEN STRL DISP B (DISPOSABLE) ×2 IMPLANT
TRAY FOLEY CATH 14FR (SET/KITS/TRAYS/PACK) ×1 IMPLANT
TRAY FOLEY MTR SLVR 16FR STAT (SET/KITS/TRAYS/PACK) ×1 IMPLANT
TRAY LAPAROSCOPIC (CUSTOM PROCEDURE TRAY) ×2 IMPLANT
TROCAR BLADELESS OPT 5 100 (ENDOMECHANICALS) IMPLANT
TROCAR XCEL 12X100 BLDLESS (ENDOMECHANICALS) ×2 IMPLANT
WATER STERILE IRR 1000ML POUR (IV SOLUTION) ×3 IMPLANT

## 2018-02-01 NOTE — H&P (Signed)
Jocelyn Moss is an 83 y.o. female.    Chief Complaint: Pre-Op Right Partial v. Radical Nephrectomy  HPI:   1 - Right Renal Mass - 3.6cm solid enhancing extreme lower pole mass incidetnal on renal US then dedicated MRI 2019. Mass is solitary, lower pole, about 70% endophytic and abuts hilar fat. Clinically localized. 1 artery / 1 vein right renovascular anatomy. She has iliostomy.   2 - Stage 3/4 Renal Insuficiency - Cr 1.3-2 2019 x several. She has chronic dehydration from iliostomy. No hydro / stones by renal imaging. Her GFR is known to be very hydration sensitive, improves drastically with hydration.   PMH sig for colon resection / ileostomy (for ulcerative colitis, small incidetnal cancer noted), breast caner (lump/XRT/chemo), hypothyroid, AFib/CBA/Plavix (follows Diona Browner cards occasionally). Her PCP is Jocelyn Margaretmary Eddy MD with [facility]. Her daughter Jocelyn Moss is involved with her care.   Today " Jocelyn Moss" is seen to proceed with RIGHT retroperitoneal partial v. Radical nephrectomy.    Past Medical History:  Diagnosis Date  . Atrial fibrillation ([place])   . Breast cancer ([place]) 1990  . Chest pain   . Colon cancer ([place])    ileostomy 2000  . Dysrhythmia   . History of colon cancer   . Hypercholesterolemia   . Hypothyroidism   . Multiple lacunar infarcts ([place])   . MVP (mitral valve prolapse)   . Osteoporosis    osteopenia  . Personal history of chemotherapy    6 months  . Personal history of radiation therapy    6 months  . SOB (shortness of breath)     Past Surgical History:  Procedure Laterality Date  . BREAST BIOPSY    . BREAST LUMPECTOMY Left   . COLON SURGERY  2000   ileostomy  . ILEOSTOMY      Family History  Problem Relation Age of Onset  . Lung cancer Mother   . Lung cancer Father   . Lung cancer Sister   . Breast cancer Sister    Social History:  reports that she has never smoked. She has never used smokeless tobacco. She reports that she does not  drink alcohol or use drugs.  Allergies:  Allergies  Allergen Reactions  . Crestor [Rosuvastatin Calcium] Other (See Comments)    Joint pain, effected liver function   . Rosuvastatin Other (See Comments)    Joint pain, effected liver function  . Zetia [Ezetimibe] Other (See Comments)    Joint pain, effected liver function     No medications prior to admission.    No results found for this or any previous visit (from the past 48 hour(s)). No results found.  Review of Systems  Constitutional: Negative.   HENT: Negative.   Eyes: Negative.   Respiratory: Negative.   Cardiovascular: Negative.   Gastrointestinal: Negative.   Genitourinary: Negative.   Musculoskeletal: Negative.   Skin: Negative.   Neurological: Negative.   Endo/Heme/Allergies: Negative.   Psychiatric/Behavioral: Negative.     There were no vitals taken for this visit. Physical Exam  Constitutional: She appears well-developed.  HENT:  Head: Normocephalic.  Eyes: Pupils are equal, round, and reactive to light.  Neck: Normal range of motion.  Cardiovascular: Normal rate.  Respiratory: Effort normal.  GI: Soft.  Stable iliostomy and scars Jocelyn/o hernias.   Genitourinary:    Genitourinary Comments: NO CVAT   Musculoskeletal: Normal range of motion.  Neurological: She is alert.  Skin: Skin is warm.  Psychiatric: She has a normal  mood and affect.     Assessment/Plan  Proceed as planned right retroperitoneal partial v. Radical nephrectomy. Risks, benefits, alternatives, expected peri-op course discussed previously and reiterated today.   Alexis Frock, MD 02/01/2018, 8:32 AM

## 2018-02-01 NOTE — Anesthesia Preprocedure Evaluation (Addendum)
Anesthesia Evaluation  Patient identified by MRN, date of birth, ID band Patient awake    Reviewed: Allergy & Precautions, NPO status , Patient's Chart, lab work & pertinent test results, reviewed documented beta blocker date and time   History of Anesthesia Complications Negative for: history of anesthetic complications  Airway Mallampati: III  TM Distance: <3 FB Neck ROM: Full    Dental  (+) Teeth Intact   Pulmonary neg pulmonary ROS,    Pulmonary exam normal        Cardiovascular Pt. on home beta blockers + dysrhythmias Atrial Fibrillation + Valvular Problems/Murmurs MR  Rhythm:Irregular Rate:Normal     Neuro/Psych CVA negative psych ROS   GI/Hepatic negative GI ROS, Neg liver ROS,   Endo/Other  negative endocrine ROSHypothyroidism   Renal/GU Renal InsufficiencyRenal diseasenegative Renal ROS  negative genitourinary   Musculoskeletal negative musculoskeletal ROS (+)   Abdominal   Peds  Hematology  (+) anemia ,   Anesthesia Other Findings 83 yo F for right robotic partial nephrectomy - CVA on Plavix, A fib, MR/TR, CKD IV (Cr 1.32), hypothyroid, anemia (Hgb 11.7) - TTE 2004: normal LV function, mild MR & TR  Reproductive/Obstetrics                            Anesthesia Physical Anesthesia Plan  ASA: III  Anesthesia Plan: General   Post-op Pain Management:    Induction: Intravenous  PONV Risk Score and Plan: 3 and Ondansetron, Dexamethasone, Midazolam and Treatment may vary due to age or medical condition  Airway Management Planned: Oral ETT  Additional Equipment: None  Intra-op Plan:   Post-operative Plan: Extubation in OR  Informed Consent: I have reviewed the patients History and Physical, chart, labs and discussed the procedure including the risks, benefits and alternatives for the proposed anesthesia with the patient or authorized representative who has indicated his/her  understanding and acceptance.   Dental advisory given  Plan Discussed with:   Anesthesia Plan Comments:        Anesthesia Quick Evaluation

## 2018-02-01 NOTE — Discharge Instructions (Signed)
1- Drain Sites - You may have some mild persistent drainage from old drain site for several days, this is normal. This can be covered with cotton gauze for convenience.  2 - Stiches - Your stitches are all dissolvable. You may notice a "loose thread" at your incisions, these are normal and require no intervention. You may cut them flush to the skin with fingernail clippers if needed for comfort.  3 - Diet - No restrictions  4 - Activity - No heavy lifting / straining (any activities that require valsalva or "bearing down") x 4 weeks. Otherwise, no restrictions.  5 - Bathing - You may shower immediately. Do not take a bath or get into swimming pool where incision sites are submersed in water x 4 weeks.   6 - When to Call the Doctor - Call MD for any fever >102, any acute wound problems, or any severe nausea / vomiting. You can call the [facility] Office [phone]) 24 hours a day 365 days a year. It will roll-over to the answering service and on-call physician after hours.   You may resume Plavix 5 days after surgery and aspirin, advil, aleve, vitamins, and supplements 7 days after surgery.

## 2018-02-01 NOTE — Brief Op Note (Signed)
02/01/2018  3:44 PM  PATIENT:  Jocelyn Moss  83 y.o. female  PRE-OPERATIVE DIAGNOSIS:  RIGHT RENAL MASS  POST-OPERATIVE DIAGNOSIS:  RIGHT RENAL MASS  PROCEDURE:  Procedure(s): XI ROBOTIC ASSISTED RETROPERITONEAL PARTIAL NEPHRECTOMY (Right)  SURGEON:  Surgeon(s) and Role:    Alexis Frock, MD - Primary  PHYSICIAN ASSISTANT:   ASSISTANTS: Debbrah Alar PA   ANESTHESIA:   local and general  EBL:  100 mL   BLOOD ADMINISTERED:none  DRAINS: 1 - JP to bulb; 2 - FOley to gravity; 3 - iliostomy   LOCAL MEDICATIONS USED:  MARCAINE     SPECIMEN:  Source of Specimen:  Rt partial nephrectomy, hilar lymph node  DISPOSITION OF SPECIMEN:  PATHOLOGY  COUNTS:  YES  TOURNIQUET:  * No tourniquets in log *  DICTATION: .Other Dictation: Dictation Number [id number]  PLAN OF CARE: Admit to inpatient   PATIENT DISPOSITION:  PACU - hemodynamically stable.   Delay start of Pharmacological VTE agent (>24hrs) due to surgical blood loss or risk of bleeding: yes

## 2018-02-01 NOTE — Transfer of Care (Signed)
Immediate Anesthesia Transfer of Care Note  Patient: Jocelyn Moss  Procedure(s) Performed: XI ROBOTIC ASSISTED RETROPERITONEAL PARTIAL NEPHRECTOMY (Right )  Patient Location: PACU  Anesthesia Type:General  Level of Consciousness: awake, alert , oriented and patient cooperative  Airway & Oxygen Therapy: Patient Spontanous Breathing and Patient connected to face mask  Post-op Assessment: Report given to RN and Post -op Vital signs reviewed and stable  Post vital signs: Reviewed and stable  Last Vitals:  Vitals Value Taken Time  BP    Temp    Pulse 71 02/01/2018  4:11 PM  Resp 16 02/01/2018  4:11 PM  SpO2 100 % 02/01/2018  4:11 PM  Vitals shown include unvalidated device data.  Last Pain:  Vitals:   02/01/18 1020  TempSrc:   PainSc: 0-No pain      Patients Stated Pain Goal: 4 (47/20/72 1828)  Complications: No apparent anesthesia complications

## 2018-02-01 NOTE — Anesthesia Procedure Notes (Signed)
Procedure Name: Intubation Date/Time: 02/01/2018 12:30 PM Performed by: Montel Clock, CRNA Pre-anesthesia Checklist: Patient identified, Emergency Drugs available, Suction available and Patient being monitored Oxygen Delivery Method: Circle system utilized Preoxygenation: Pre-oxygenation with 100% oxygen Induction Type: IV induction Ventilation: Oral airway inserted - appropriate to patient size Laryngoscope Size: Miller and 2 Grade View: Grade I Tube type: Oral Tube size: 7.0 mm Number of attempts: 3 Airway Equipment and Method: Stylet Placement Confirmation: ETT inserted through vocal cords under direct vision,  positive ETCO2,  CO2 detector and breath sounds checked- equal and bilateral Secured at: 21 cm Tube secured with: Tape Dental Injury: Teeth and Oropharynx as per pre-operative assessment  Difficulty Due To: Difficult Airway- due to anterior larynx and Difficult Airway- due to limited oral opening Comments: Attempts x 2 by SRNA w/MAC 3 - grade 3 view; final intubating blade Miller II by Dr. Christella Hartigan. Grade 1 view with downward laryngeal pressure. Limited mouth opening + anterior airway.

## 2018-02-01 NOTE — Progress Notes (Signed)
Dr. Kerin Perna notified of pts abnormal heartrate.  Given strip from monitor.PVCs shown as well as sinus rhythm and sinus tachycardia.  No new orders.

## 2018-02-02 ENCOUNTER — Encounter (HOSPITAL_COMMUNITY): Payer: Self-pay | Admitting: Urology

## 2018-02-02 DIAGNOSIS — C641 Malignant neoplasm of right kidney, except renal pelvis: Secondary | ICD-10-CM | POA: Diagnosis not present

## 2018-02-02 LAB — HEMOGLOBIN AND HEMATOCRIT, BLOOD
HCT: 33.7 % — ABNORMAL LOW (ref 36.0–46.0)
Hemoglobin: 10.8 g/dL — ABNORMAL LOW (ref 12.0–15.0)

## 2018-02-02 LAB — BASIC METABOLIC PANEL
Anion gap: 11 (ref 5–15)
BUN: 18 mg/dL (ref 8–23)
CO2: 28 mmol/L (ref 22–32)
CREATININE: 1.3 mg/dL — AB (ref 0.44–1.00)
Calcium: 8.2 mg/dL — ABNORMAL LOW (ref 8.9–10.3)
Chloride: 89 mmol/L — ABNORMAL LOW (ref 98–111)
GFR calc Af Amer: 45 mL/min — ABNORMAL LOW (ref >60)
GFR calc non Af Amer: 39 mL/min — ABNORMAL LOW (ref >60)
Glucose, Bld: 204 mg/dL — ABNORMAL HIGH (ref 70–99)
Potassium: 2.9 mmol/L — ABNORMAL LOW (ref 3.5–5.1)
Sodium: 128 mmol/L — ABNORMAL LOW (ref 135–145)

## 2018-02-02 MED ORDER — POTASSIUM & SODIUM PHOSPHATES 280-160-250 MG PO PACK
1.0000 | PACK | Freq: Once | ORAL | Status: AC
  Administered 2018-02-02: 1 via ORAL
  Filled 2018-02-02: qty 1

## 2018-02-02 MED ORDER — POTASSIUM & SODIUM PHOSPHATES 280-160-250 MG PO PACK
1.0000 | PACK | ORAL | Status: AC
  Administered 2018-02-02: 1 via ORAL
  Filled 2018-02-02: qty 1

## 2018-02-02 NOTE — Op Note (Signed)
NAMEFRANCELIA, [facility] MED[facility] RECORD KX:[id number] ACCOUNT [id number] DATE OF BIRTH:February 06, 1935 FACILITY: [facility] LOCATION: [facility] PHYSICIAN:Mannix Kroeker, MD  OPERATIVE REPORT  DATE OF PROCEDURE:  02/01/2018  PREOPERATIVE DIAGNOSIS:  Right renal mass.  PROCEDURE PERFORMED:  Robotic-assisted laparoscopic right partial nephrectomy, retroperitoneal approach.  ESTIMATED BLOOD LOSS:  100 mL.  COMPLICATIONS:  None.  SPECIMENS: 1.  Right perihilar lymph node. 2.  Right partial nephrectomy for permanent pathology.  DRAINS: 1.  Jackson-Pratt drain to bulb suction. 2.  Foley catheter to straight drain.  ASSISTANT:  Debbrah Alar, PA  FINDINGS: 1.  Single artery, single vein, right renovascular anatomy. 2.  Exophytic right lower pole renal mass. 3.  Borderline enlarged right retroperitoneal perihilar lymph node.  INDICATIONS:  The patient is a very pleasant and quite vigorous 83 year old lady who unfortunately has a history of inflammatory bowel disease.  She is status post colectomy.  She has an end ileostomy.  She has been managed with this successfully for a  number of years.  She was found incidentally to have a right lower pole renal mass enhancing and solid, concerning for likely localized renal cell carcinoma.  Options were discussed for management including surveillance protocols versus ablative therapy  versus surgical extirpation with or without nephron sparing, and she adamantly wished to proceed with right partial nephrectomy.  Given her history of end ileostomy with major intraperitoneal surgery, it was felt that a retroperitoneal approach would be  most advantageous to minimize intraoperative complications, and she wished to proceed.  Informed consent was obtained and placed in the medical record.  PROCEDURE IN DETAIL:  The patient being identified and procedure being right retroperitoneal partial nephrectomy was confirmed.  Procedure timeout was performed.  Intravenous  antibiotics were administered.  General endotracheal anesthesia was induced.   The patient was placed into the right side up full flank position, pulling 15 degrees of table flexion, superior arm elevator axillary roll, sequential compression devices, bottom leg bent, top leg straight.  She was further fastened to the table using  3-inch tape over foam padding across the supraxiphoid chest and her pelvis.  Her ileostomy was excluded from the draping.  Attention was then directed at retroperitoneal access.  Incision was made approximately 12 mm in length, approximately 1  fingerbreadth inferior lateral to the tip of the 12th rib.  Using surgeon's finger, dissection proceeded directly down through subcutaneous fat and superficial muscle to the deep fascia of the flank, which was then very carefully pierced with a Kelly  forceps and dilated.  This allowed further digital dissection into the retroperitoneal space.  This was corroborated by palpation of the psoas muscle and lower pole of the kidney.  The retroperitoneal space was developed maximally with the surgeon's  finger.  The new Spacemaker balloon dilation apparatus was carefully advanced in a retroperitoneal location just off the psoas musculature, and under laparoscopic vision inflated maximally to develop the retroperitoneal space via the balloon.  The  retroperitoneal space was dilated sufficiently, allowing inspection of the vena cava, renal vasculature, lower pole of the kidney, diaphragm area.  The balloon dilation apparatus was then removed, and ports were placed using palpation technique with a  medial-most port being just inferior to the 12th rib and lateral upright and just lateral to the psoas muscle, the previous access point becoming the camera port and additional ports just lateral to this by 4 cm and additional port lateral to that was 4  cm, all robotic type, and then finally a 12 mm  AirSeal port approximately 4 cm below the access to the  camera port.  The Spacemaker balloon seal-type port was then placed through the previous access point using a piggyback technique.  A robotic camera  port was placed through this.  Pneumoperitoneum was obtained using the assistant port site initial type.  Attention was then directed to further development of the retroperitoneum.  Dissection proceeded directly off the psoas musculature, keeping it as a  horizontal floor of dissection.  The inferior vena cava was encountered and dissected proximally to the area of the renal hilum, which consisted of a single artery, single vein and renovascular anatomy was anticipated.  The renal artery was  circumferentially mobilized, marked as vessel loop.  There was a borderline enlarged lymph node in the area.  This was carefully dissected free.  Lymphostasis achieved with clips, set aside, labeled as perihilar lymph node.  A lower pole of the kidney  area was identified, as was the ureter.  The ureter was carefully swept medial, and lower pole kidney area was defatted, staying within the retroperitoneal plane.  Immediately there was noted to be an exophytic solid mass consistent with known mass, and  this was circumferentially mobilized, keeping a small bucket handle of fat on the inferior aspect of this.  This was circumferentially mobilized such that it was felt that an attempt at partial nephrectomy would be safe and feasible.  Warm ischemia was  then achieved by using 2 laparoscopic bulldog clamps on the renal artery, and partial nephrectomy was performed using a combination of cold scissors and point coagulation current, keeping what appeared to be a rim of normal-appearing parenchyma with the  partial nephrectomy specimen.  A purposeful entrance in the collecting system was performed and hilar fat ____ corroborating expected deep aspect of the tumor, which was then set aside for later retrieval.  First layer renorrhaphy was performed using  running 3-0 V-Loc  suture, oversewing several small venous sinuses and the purposeful entrance into the collecting system which resulted in excellent apposition of this.  A Surgicel bolster was then applied, and 4 parenchymal apposition sutures of 0  Vicryl sandwiched between Hem-o-loks and Lapra-Tys were then applied which resulted in excellent parenchymal position around the bolster.  Warm ischemia was then stopped.  Hemostasis appeared excellent.  Sponge and needle counts were correct.  Lower pole  perinephric fat was reapproximated using running V-Loc to further provide mild tamponade to the partial nephrectomy area which was completely hemostatic.  The vessel loop was removed.  The specimen was placed into an EndoCatch bag.  The robot was then  undocked.  Specimen was retrieved by extending the previous access port site, a distance of approximately 3 cm, removing the partial nephrectomy specimen, setting aside for permanent pathology.  The site was closed with fascia using figure-of-eight PDS  x2 followed by reapproximation of Scarpa's with a running Vicryl.  All incision sites were infiltrated with dilute lipolyzed Marcaine and closed at the level of the skin using subcuticular Monocryl and Dermabond.  Notably, the medial-most robotic port site was used to  place a closed suction drain under laparoscopic vision.  All ports were removed.  The procedure then terminated.  The patient tolerated the procedure well.  No immediate perioperative complications.  The patient taken to postanesthesia care unit in  stable condition.  Please note first assistant Debbrah Alar was crucial for all portions of the procedure today.  She provided invaluable vascular clamping, specimen manipulation, suctioning, retraction and general first  assistance.  LN/NUANCE  D:02/01/2018 T:02/01/2018 JOB:[id number]

## 2018-02-02 NOTE — Progress Notes (Signed)
Urology Progress Note   1 Day Post-Op  Subjective/Interval: NAEON. AFVSS. UOP adequate. No pain. Improving right sided facial swelling (due to OR positioning/use of airseal). Single episode of emesis this am with taking medication, no persistent nausea. Tolerating clears well. Not yet ambulating.  Objective: Vital signs in last 24 hours: Temp:  [96 F (35.6 C)-98 F (36.7 C)] 97.8 F (36.6 C) (01/09 0552) Pulse Rate:  [55-104] 70 (01/09 0552) Resp:  [11-21] 16 (01/09 0552) BP: (129-165)/(76-91) 149/91 (01/09 0552) SpO2:  [93 %-100 %] 98 % (01/09 0552) Weight:  [66.8 kg-69 kg] 69 kg (01/08 1758)  Intake/Output from previous day: 01/08 0701 - 01/09 0700 In: 2437.7 [I.V.:2137.7; IV Piggyback:300] Out: 2025 [Urine:1850; Drains:75; Blood:100] Intake/Output this shift: No intake/output data recorded.  Physical Exam:  General: Alert and oriented HEENT: right sided facial swelling improving (due to positioning/use of airseal) CV: Regular rate, no peripheral edema Lungs: Clear Abdomen: Soft, appropriately tender. Right flank incisions c/d/i with some surrounding ecchymoses, JP with thin s/s output GU: Foley in place draining clear yellow urine Ext: NT, No erythema  Lab Results: Recent Labs    02/01/18 1618 02/02/18 0605  HGB 11.4* 10.8*  HCT 36.3 33.7*   BMET Recent Labs    02/02/18 0605  NA 128*  K 2.9*  CL 89*  CO2 28  GLUCOSE 204*  BUN 18  CREATININE 1.30*  CALCIUM 8.2*     Studies/Results: No results found.  Assessment/Plan:  83 y.o. female s/p right robotic retroperitoneal partial nephrectomy.  Overall doing well post-op.   - Continue pain meds PRN - Medlock/regular diet - D/c Foley catheter - Continue JP to drainage - Replete K/Na orally, repeat am labs - OOB/IS/SCDs  Dispo: floor   LOS: 0 days   Cian Costanzo Rob Bunting 02/02/2018, 8:14 AM

## 2018-02-03 DIAGNOSIS — C641 Malignant neoplasm of right kidney, except renal pelvis: Secondary | ICD-10-CM | POA: Diagnosis not present

## 2018-02-03 LAB — BASIC METABOLIC PANEL
Anion gap: 12 (ref 5–15)
BUN: 19 mg/dL (ref 8–23)
CHLORIDE: 89 mmol/L — AB (ref 98–111)
CO2: 24 mmol/L (ref 22–32)
Calcium: 7.9 mg/dL — ABNORMAL LOW (ref 8.9–10.3)
Creatinine, Ser: 1.12 mg/dL — ABNORMAL HIGH (ref 0.44–1.00)
GFR calc Af Amer: 54 mL/min — ABNORMAL LOW (ref >60)
GFR calc non Af Amer: 46 mL/min — ABNORMAL LOW (ref >60)
Glucose, Bld: 103 mg/dL — ABNORMAL HIGH (ref 70–99)
Potassium: 2.9 mmol/L — ABNORMAL LOW (ref 3.5–5.1)
Sodium: 125 mmol/L — ABNORMAL LOW (ref 135–145)

## 2018-02-03 LAB — CBC
HCT: 31.5 % — ABNORMAL LOW (ref 36.0–46.0)
Hemoglobin: 10 g/dL — ABNORMAL LOW (ref 12.0–15.0)
MCH: 30.6 pg (ref 26.0–34.0)
MCHC: 31.7 g/dL (ref 30.0–36.0)
MCV: 96.3 fL (ref 80.0–100.0)
Platelets: 162 10*3/uL (ref 150–400)
RBC: 3.27 MIL/uL — ABNORMAL LOW (ref 3.87–5.11)
RDW: 12.7 % (ref 11.5–15.5)
WBC: 11.8 10*3/uL — ABNORMAL HIGH (ref 4.0–10.5)
nRBC: 0 % (ref 0.0–0.2)

## 2018-02-03 NOTE — Progress Notes (Signed)
Patient discharged home.  IV removed - WNL.  Reviewed AVS and medications, follow up in place. Instructed on s/s of infection and when to call MD, and on incision care,.  No questions at this time - verbalizes understanding.  Patient in NAD - assisted off unit by staff

## 2018-02-03 NOTE — Discharge Summary (Signed)
Physician Discharge Summary  Patient ID: Jocelyn Moss MRN: [id number] DOB/AGE: July 16, 1935 83 y.o.  Admit date: 02/01/2018 Discharge date: 02/03/2018  Admission Diagnoses: Right Renal Mass  Discharge Diagnoses:  Active Problems:   Renal mass   Discharged Condition: good  Hospital Course: Pt underwent RIGHT robotic retroperitoneal partial nephrectomy on 02/01/18, the day of admission, without acute complication. She was admitted to the 4th floor Urology service post-op. By the AM of POD 2 she is ambulatory, pain controlled on PO meds, maintaining PO hydration, and felt to be adequtae for discharge. Path pending. Cr <1.5. Hgb 10.   Consults: None  Significant Diagnostic Studies: labs: as per above  Treatments: surgery: as per above.   Discharge Exam: Blood pressure 119/73, pulse 66, temperature 97.9 F (36.6 C), temperature source Oral, resp. rate 19, height 5\' 7"  (1.702 m), weight 69 kg, SpO2 97 %. General appearance: alert, cooperative and family at bedside Eyes: negative, improving Rt orbital emphysema (exepcted post-op) Nose: Nares normal. Septum midline. Mucosa normal. No drainage or sinus tenderness. Throat: lips, mucosa, and tongue normal; teeth and gums normal Neck: supple, symmetrical, trachea midline Back: symmetric, no curvature. ROM normal. No CVA tenderness. Resp: non-labored on room air Cardio: Nl rate GI: soft, non-tender; bowel sounds normal; no masses,  no organomegaly and RLQ iliostomy patent. Recent incision sites c/d/i. JP with scant serous fluid removed and dry dressing placed.  Pelvic: external genitalia normal Extremities: extremities normal, atraumatic, no cyanosis or edema Skin: Skin color, texture, turgor normal. No rashes or lesions Lymph nodes: Cervical, supraclavicular, and axillary nodes normal. Neurologic: Grossly normal  Disposition:    Allergies as of 02/03/2018      Reactions   Crestor [rosuvastatin Calcium] Other (See Comments)   Joint  pain, effected liver function   Rosuvastatin Other (See Comments)   Joint pain, effected liver function   Zetia [ezetimibe] Other (See Comments)   Joint pain, effected liver function      Medication List    STOP taking these medications   clopidogrel 75 MG tablet Commonly known as:  PLAVIX     TAKE these medications   atorvastatin 20 MG tablet Commonly known as:  LIPITOR TAKE 1 TABLET BY MOUTH ONCE DAILY   DULERA 100-5 MCG/ACT Aero Generic drug:  mometasone-formoterol USE 2 INHALATIONS TWICE A DAY What changed:  See the new instructions.   HYDROcodone-acetaminophen 5-325 MG tablet Commonly known as:  NORCO Take 1-2 tablets by mouth every 6 (six) hours as needed for moderate pain or severe pain.   levothyroxine 100 MCG tablet Commonly known as:  SYNTHROID, LEVOTHROID TAKE 1 TABLET BY MOUTH EVERY OTHER DAY   levothyroxine 112 MCG tablet Commonly known as:  SYNTHROID, LEVOTHROID TAKE 1 TABLET BY MOUTH EVERY OTHER DAY   loperamide 2 MG capsule Commonly known as:  IMODIUM TAKE 2 CAPSULES BY MOUTH AS NEEDED FOR DIARRHEA OR LOOSE STOOLS. DO NOT TAKE MORE THAN 4 TABS/DAY What changed:  See the new instructions.   metoprolol tartrate 25 MG tablet Commonly known as:  LOPRESSOR TAKE ONE TABLET BY MOUTH TWICE DAILY What changed:  how much to take   promethazine 12.5 MG tablet Commonly known as:  PHENERGAN Take 1 tablet (12.5 mg total) by mouth every 8 (eight) hours as needed for nausea or vomiting.      Follow-up Information    Alexis Frock, MD On 02/16/2018.   Specialty:  Urology Why:  at 9 AM for MD visit and pathology review Contact information: [address] [place] [address] [phone]           Signed: Alexis Frock 02/03/2018, 7:41 AM

## 2018-02-03 NOTE — Anesthesia Postprocedure Evaluation (Signed)
Anesthesia Post Note  Patient: Jocelyn Moss  Procedure(s) Performed: XI ROBOTIC ASSISTED RETROPERITONEAL PARTIAL NEPHRECTOMY (Right )     Patient location during evaluation: PACU Anesthesia Type: General Level of consciousness: awake and patient cooperative Pain management: pain level controlled Vital Signs Assessment: post-procedure vital signs reviewed and stable Respiratory status: spontaneous breathing, nonlabored ventilation, respiratory function stable and patient connected to nasal cannula oxygen Cardiovascular status: blood pressure returned to baseline and stable Postop Assessment: no apparent nausea or vomiting Anesthetic complications: no    Last Vitals:  Vitals:   02/02/18 2036 02/03/18 0356  BP: 131/86 119/73  Pulse: 69 66  Resp: 16 19  Temp: 36.8 C 36.6 C  SpO2: 95% 97%    Last Pain:  Vitals:   02/03/18 0356  TempSrc: Oral  PainSc:                  Kathrynne Kulinski

## 2018-02-27 ENCOUNTER — Encounter: Payer: Self-pay | Admitting: Family Medicine

## 2018-02-27 DIAGNOSIS — C649 Malignant neoplasm of unspecified kidney, except renal pelvis: Secondary | ICD-10-CM | POA: Insufficient documentation

## 2018-03-31 ENCOUNTER — Other Ambulatory Visit: Payer: Self-pay | Admitting: *Deleted

## 2018-03-31 MED ORDER — MOMETASONE FURO-FORMOTEROL FUM 100-5 MCG/ACT IN AERO: 2.0000 | INHALATION_SPRAY | Freq: Two times a day (BID) | RESPIRATORY_TRACT | 11 refills | Status: DC

## 2018-04-06 ENCOUNTER — Other Ambulatory Visit: Payer: Self-pay | Admitting: *Deleted

## 2018-04-06 MED ORDER — MOMETASONE FURO-FORMOTEROL FUM 100-5 MCG/ACT IN AERO: 2.0000 | INHALATION_SPRAY | Freq: Two times a day (BID) | RESPIRATORY_TRACT | 3 refills | Status: DC

## 2018-07-06 ENCOUNTER — Encounter: Payer: Self-pay | Admitting: Family Medicine

## 2018-07-06 ENCOUNTER — Ambulatory Visit: Payer: Medicare Other | Admitting: Family Medicine

## 2018-07-06 ENCOUNTER — Other Ambulatory Visit: Payer: Self-pay

## 2018-07-06 ENCOUNTER — Ambulatory Visit
Admission: RE | Admit: 2018-07-06 | Discharge: 2018-07-06 | Disposition: A | Discharge status: Home / Self Care | Payer: Medicare Other | Source: Ambulatory Visit | Attending: Family Medicine | Admitting: Family Medicine

## 2018-07-06 VITALS — BP 100/62 | HR 89 | Temp 97.4°F | Resp 16 | Ht 68.0 in | Wt 137.0 lb

## 2018-07-06 DIAGNOSIS — R05 Cough: Secondary | ICD-10-CM

## 2018-07-06 DIAGNOSIS — E038 Other specified hypothyroidism: Secondary | ICD-10-CM | POA: Diagnosis not present

## 2018-07-06 DIAGNOSIS — R634 Abnormal weight loss: Secondary | ICD-10-CM | POA: Diagnosis not present

## 2018-07-06 DIAGNOSIS — N184 Chronic kidney disease, stage 4 (severe): Secondary | ICD-10-CM

## 2018-07-06 DIAGNOSIS — R059 Cough, unspecified: Secondary | ICD-10-CM

## 2018-07-06 DIAGNOSIS — M2550 Pain in unspecified joint: Secondary | ICD-10-CM

## 2018-07-06 MED ORDER — PANTOPRAZOLE SODIUM 40 MG PO TBEC: 40.0000 mg | DELAYED_RELEASE_TABLET | Freq: Every day | ORAL | 3 refills | Status: DC

## 2018-07-06 NOTE — Progress Notes (Signed)
Subjective:    Patient ID: Jocelyn Moss, female    DOB: June 28, 1935, 83 y.o.   MRN: [id number]  HPI  Patient is a very sweet pleasant 83 year old Caucasian female who presents today with her daughter.  Over the last few months, the patient has had a declining appetite.  As a result she has lost weight. Wt Readings from Last 3 Encounters:  07/06/18 137 lb (62.1 kg)  02/01/18 152 lb 1.9 oz (69 kg)  01/27/18 147 lb 6 oz (66.8 kg)   Patient was 152 pounds in January.  She is down to 137 pounds today.  Over roughly the same time, the patient has developed a cough.  I seen her several times in the past for a nonproductive cough.  It was ultimately felt to be due to a combination of laryngo-esophageal reflux and underlying asthma.  In the past she responded well to a combination of proton pump inhibitors as well as Delora.  She is still consistent with the [place].  She is also sleeping elevated in bed however she continues to have a cough that is nonproductive.  She denies any hemoptysis.  She denies any chest pain.  She denies any fevers or chills.  She does have a history of renal cell carcinoma status post resection.  She also has a history of breast cancer and colon cancer.  She has not had a chest x-ray.  She is not had any recent imaging to evaluate for any cancer recurrence although she is scheduled for a CT scan in July as well as a chest x-ray in July.  Also roughly around the same time, the patient had a tick bite.  Ever since that time she is developed pain in her joints primarily her shoulders and her arms.  She denies any rash associated with the tick bite.  She has osteoarthritic changes in the PIP and DIP joints on both hands. Past Medical History:  Diagnosis Date  . Atrial fibrillation ([place])   . Breast cancer ([place]) 1990  . Chest pain   . Colon cancer ([place])    ileostomy 2000  . Dysrhythmia   . History of colon cancer   . Hypercholesterolemia   . Hypothyroidism   . Multiple lacunar  infarcts ([place])   . MVP (mitral valve prolapse)   . Osteoporosis    osteopenia  . Personal history of chemotherapy    6 months  . Personal history of radiation therapy    6 months  . Renal cell carcinoma (HCC)    stage 1 s/p resection Dr. Tresa Moore 2020  . SOB (shortness of breath)    Past Surgical History:  Procedure Laterality Date  . BREAST BIOPSY    . BREAST LUMPECTOMY Left   . COLON SURGERY  2000   ileostomy  . ILEOSTOMY    . ROBOTIC ASSITED PARTIAL NEPHRECTOMY Right 02/01/2018   Procedure: XI ROBOTIC ASSISTED RETROPERITONEAL PARTIAL NEPHRECTOMY;  Surgeon: Alexis Frock, MD;  Location: [facility];  Service: Urology;  Laterality: Right;   Current Outpatient Medications on File Prior to Visit  Medication Sig Dispense Refill  . levothyroxine (SYNTHROID, LEVOTHROID) 100 MCG tablet TAKE 1 TABLET BY MOUTH EVERY OTHER DAY 45 tablet 2  . levothyroxine (SYNTHROID, LEVOTHROID) 112 MCG tablet TAKE 1 TABLET BY MOUTH EVERY OTHER DAY 45 tablet 3  . metoprolol tartrate (LOPRESSOR) 25 MG tablet TAKE ONE TABLET BY MOUTH TWICE DAILY (Patient taking differently: Take 12.5 mg by mouth 2 (two) times daily. ) 180 tablet 3  .  mometasone-formoterol (DULERA) 100-5 MCG/ACT AERO Inhale 2 puffs into the lungs 2 (two) times daily. 3 Inhaler 3  . atorvastatin (LIPITOR) 20 MG tablet TAKE 1 TABLET BY MOUTH ONCE DAILY (Patient not taking: Reported on 07/06/2018) 90 tablet 3  . HYDROcodone-acetaminophen (NORCO) 5-325 MG tablet Take 1-2 tablets by mouth every 6 (six) hours as needed for moderate pain or severe pain. (Patient not taking: Reported on 07/06/2018) 30 tablet 0  . loperamide (IMODIUM) 2 MG capsule TAKE 2 CAPSULES BY MOUTH AS NEEDED FOR DIARRHEA OR LOOSE STOOLS. DO NOT TAKE MORE THAN 4 TABS/DAY (Patient not taking: No sig reported) 30 capsule 2  . promethazine (PHENERGAN) 12.5 MG tablet Take 1 tablet (12.5 mg total) by mouth every 8 (eight) hours as needed for nausea or vomiting. (Patient not taking: Reported on  07/06/2018) 20 tablet 0   No current facility-administered medications on file prior to visit.    Allergies  Allergen Reactions  . Crestor [Rosuvastatin Calcium] Other (See Comments)    Joint pain, effected liver function   . Rosuvastatin Other (See Comments)    Joint pain, effected liver function  . Zetia [Ezetimibe] Other (See Comments)    Joint pain, effected liver function    Social History   Socioeconomic History  . Marital status: Married    Spouse name: Not on file  . Number of children: Not on file  . Years of education: Not on file  . Highest education level: Not on file  Occupational History  . Not on file  Social Needs  . Financial resource strain: Not on file  . Food insecurity    Worry: Not on file    Inability: Not on file  . Transportation needs    Medical: Not on file    Non-medical: Not on file  Tobacco Use  . Smoking status: Never Smoker  . Smokeless tobacco: Never Used  Substance and Sexual Activity  . Alcohol use: No  . Drug use: No  . Sexual activity: Not on file  Lifestyle  . Physical activity    Days per week: Not on file    Minutes per session: Not on file  . Stress: Not on file  Relationships  . Social Herbalist on phone: Not on file    Gets together: Not on file    Attends religious service: Not on file    Active member of club or organization: Not on file    Attends meetings of clubs or organizations: Not on file    Relationship status: Not on file  . Intimate partner violence    Fear of current or ex partner: Not on file    Emotionally abused: Not on file    Physically abused: Not on file    Forced sexual activity: Not on file  Other Topics Concern  . Not on file  Social History Narrative  . Not on file      Review of Systems  Respiratory: Positive for cough.   All other systems reviewed and are negative.      Objective:   Physical Exam Vitals signs reviewed.  Constitutional:      Appearance: She is  well-developed.  Neck:     Thyroid: No thyromegaly.     Vascular: No JVD.  Cardiovascular:     Rate and Rhythm: Normal rate and regular rhythm.     Heart sounds: Normal heart sounds. No murmur. No friction rub. No gallop.   Pulmonary:  Effort: Pulmonary effort is normal. No respiratory distress.     Breath sounds: Normal breath sounds. No stridor. No wheezing or rales.  Abdominal:     General: Bowel sounds are normal. There is no distension.     Palpations: Abdomen is soft. There is no mass.     Tenderness: There is no abdominal tenderness. There is no guarding or rebound.     Hernia: No hernia is present.  Neurological:     Mental Status: She is alert and oriented to person, place, and time.     Cranial Nerves: No cranial nerve deficit.     Motor: No abnormal muscle tone.     Coordination: Coordination normal.     Deep Tendon Reflexes: Reflexes normal.           Assessment & Plan:  1. CKD (chronic kidney disease) stage 4, GFR 15-29 ml/min (HCC)   2. Cough Given her history of cancer, her risk of aspiration, and the chronic nature of her cough, I have recommended a chest x-ray.  If the chest x-ray is clear, I will treat the patient for presumed laryngo-esophageal reflux by adding Protonix 40 mg daily - DG Chest 2 View; Future  3. Weight loss I believe the weight loss is likely due to her declining appetite and her age.  I recommended increasing Ensure or Glucerna to 2 cans a day.  Also check lab work.  Consider evaluation for malignancy with CT scans of the abdomen and pelvis if weight loss persists - CBC with Differential/Platelet - COMPLETE METABOLIC PANEL WITH GFR - TSH  4. Other specified hypothyroidism - CBC with Differential/Platelet - COMPLETE METABOLIC PANEL WITH GFR - TSH  5. Polyarthralgia -most likely osteoarthritis however I will check a Lyme titer given her recent tick bite.  I will also check a sed rate to evaluate for PMR. - B. burgdorfi antibodies  by WB - Sedimentation rate

## 2018-07-07 LAB — CBC WITH DIFFERENTIAL/PLATELET
Absolute Monocytes: 580 cells/uL (ref 200–950)
Basophils Absolute: 32 cells/uL (ref 0–200)
Basophils Relative: 0.5 %
Eosinophils Absolute: 113 cells/uL (ref 15–500)
Eosinophils Relative: 1.8 %
HCT: 31.3 % — ABNORMAL LOW (ref 35.0–45.0)
Hemoglobin: 10.5 g/dL — ABNORMAL LOW (ref 11.7–15.5)
Lymphs Abs: 876 cells/uL (ref 850–3900)
MCH: 30.1 pg (ref 27.0–33.0)
MCHC: 33.5 g/dL (ref 32.0–36.0)
MCV: 89.7 fL (ref 80.0–100.0)
MPV: 9.6 fL (ref 7.5–12.5)
Monocytes Relative: 9.2 %
Neutro Abs: 4700 cells/uL (ref 1500–7800)
Neutrophils Relative %: 74.6 %
Platelets: 241 10*3/uL (ref 140–400)
RBC: 3.49 10*6/uL — ABNORMAL LOW (ref 3.80–5.10)
RDW: 12.7 % (ref 11.0–15.0)
Total Lymphocyte: 13.9 %
WBC: 6.3 10*3/uL (ref 3.8–10.8)

## 2018-07-07 LAB — COMPLETE METABOLIC PANEL WITH GFR
AG Ratio: 1.1 (calc) (ref 1.0–2.5)
ALT: 9 U/L (ref 6–29)
AST: 19 U/L (ref 10–35)
Albumin: 3.7 g/dL (ref 3.6–5.1)
Alkaline phosphatase (APISO): 109 U/L (ref 37–153)
BUN/Creatinine Ratio: 15 (calc) (ref 6–22)
BUN: 22 mg/dL (ref 7–25)
CO2: 22 mmol/L (ref 20–32)
Calcium: 9.2 mg/dL (ref 8.6–10.4)
Chloride: 96 mmol/L — ABNORMAL LOW (ref 98–110)
Creat: 1.43 mg/dL — ABNORMAL HIGH (ref 0.60–0.88)
GFR, Est African American: 40 mL/min/{1.73_m2} — ABNORMAL LOW (ref >=60)
GFR, Est Non African American: 35 mL/min/{1.73_m2} — ABNORMAL LOW (ref >=60)
Globulin: 3.4 g/dL (calc) (ref 1.9–3.7)
Glucose, Bld: 96 mg/dL (ref 65–99)
Potassium: 4.4 mmol/L (ref 3.5–5.3)
Sodium: 130 mmol/L — ABNORMAL LOW (ref 135–146)
Total Bilirubin: 0.8 mg/dL (ref 0.2–1.2)
Total Protein: 7.1 g/dL (ref 6.1–8.1)

## 2018-07-07 LAB — TSH: TSH: 2.58 mIU/L (ref 0.40–4.50)

## 2018-07-10 ENCOUNTER — Other Ambulatory Visit: Payer: Self-pay | Admitting: Family Medicine

## 2018-07-10 MED ORDER — PREDNISONE 10 MG PO TABS: 10.0000 mg | ORAL_TABLET | Freq: Every day | ORAL | 0 refills | Status: DC

## 2018-07-11 LAB — B. BURGDORFI ANTIBODIES BY WB

## 2018-07-11 LAB — SEDIMENTATION RATE: Sed Rate: 39 mm/h — ABNORMAL HIGH (ref 0–30)

## 2018-07-14 ENCOUNTER — Encounter: Payer: Self-pay | Admitting: Family Medicine

## 2018-07-14 ENCOUNTER — Other Ambulatory Visit: Payer: Self-pay

## 2018-07-14 ENCOUNTER — Ambulatory Visit: Payer: Medicare Other | Admitting: Family Medicine

## 2018-07-14 VITALS — BP 124/64 | HR 63 | Temp 97.8°F | Resp 16 | Ht 68.0 in | Wt 137.0 lb

## 2018-07-14 DIAGNOSIS — E871 Hypo-osmolality and hyponatremia: Secondary | ICD-10-CM | POA: Diagnosis not present

## 2018-07-14 DIAGNOSIS — M2550 Pain in unspecified joint: Secondary | ICD-10-CM

## 2018-07-14 DIAGNOSIS — R634 Abnormal weight loss: Secondary | ICD-10-CM | POA: Diagnosis not present

## 2018-07-14 DIAGNOSIS — R05 Cough: Secondary | ICD-10-CM

## 2018-07-14 DIAGNOSIS — R059 Cough, unspecified: Secondary | ICD-10-CM

## 2018-07-14 MED ORDER — PREDNISONE 5 MG PO TABS: 10.0000 mg | ORAL_TABLET | Freq: Every day | ORAL | 0 refills | Status: DC

## 2018-07-14 MED ORDER — PREDNISONE 1 MG PO TABS: 4.0000 mg | ORAL_TABLET | Freq: Every day | ORAL | 0 refills | Status: DC

## 2018-07-14 NOTE — Progress Notes (Signed)
Subjective:    Patient ID: Jocelyn Moss, female    DOB: 11/19/1935, 83 y.o.   MRN: [id number]  Cough  07/06/18 Patient is a very sweet pleasant 83 year old Caucasian female who presents today with her daughter.  Over the last few months, the patient has had a declining appetite.  As a result she has lost weight. Wt Readings from Last 3 Encounters:  07/06/18 137 lb (62.1 kg)  02/01/18 152 lb 1.9 oz (69 kg)  01/27/18 147 lb 6 oz (66.8 kg)   Patient was 152 pounds in January.  She is down to 137 pounds today.  Over roughly the same time, the patient has developed a cough.  I seen her several times in the past for a nonproductive cough.  It was ultimately felt to be due to a combination of laryngo-esophageal reflux and underlying asthma.  In the past she responded well to a combination of proton pump inhibitors as well as Delora.  She is still consistent with the [place].  She is also sleeping elevated in bed however she continues to have a cough that is nonproductive.  She denies any hemoptysis.  She denies any chest pain.  She denies any fevers or chills.  She does have a history of renal cell carcinoma status post resection.  She also has a history of breast cancer and colon cancer.  She has not had a chest x-ray.  She is not had any recent imaging to evaluate for any cancer recurrence although she is scheduled for a CT scan in July as well as a chest x-ray in July.  Also roughly around the same time, the patient had a tick bite.  Ever since that time she is developed pain in her joints primarily her shoulders and her arms.  She denies any rash associated with the tick bite.  She has osteoarthritic changes in the PIP and DIP joints on both hands.  At that time, my plan was: 1. CKD (chronic kidney disease) stage 4, GFR 15-29 ml/min (HCC)   2. Cough Given her history of cancer, her risk of aspiration, and the chronic nature of her cough, I have recommended a chest x-ray.  If the chest x-ray is  clear, I will treat the patient for presumed laryngo-esophageal reflux by adding Protonix 40 mg daily - DG Chest 2 View; Future  3. Weight loss I believe the weight loss is likely due to her declining appetite and her age.  I recommended increasing Ensure or Glucerna to 2 cans a day.  Also check lab work.  Consider evaluation for malignancy with CT scans of the abdomen and pelvis if weight loss persists - CBC with Differential/Platelet - COMPLETE METABOLIC PANEL WITH GFR - TSH  4. Other specified hypothyroidism - CBC with Differential/Platelet - COMPLETE METABOLIC PANEL WITH GFR - TSH  5. Polyarthralgia -most likely osteoarthritis however I will check a Lyme titer given her recent tick bite.  I will also check a sed rate to evaluate for PMR. - B. burgdorfi antibodies by WB - Sedimentation rate   Office Visit on 07/06/2018  Component Date Value Ref Range Status  . WBC 07/06/2018 6.3  3.8 - 10.8 Thousand/uL Final  . RBC 07/06/2018 3.49* 3.80 - 5.10 Million/uL Final  . Hemoglobin 07/06/2018 10.5* 11.7 - 15.5 g/dL Final  . HCT 07/06/2018 31.3* 35.0 - 45.0 % Final  . MCV 07/06/2018 89.7  80.0 - 100.0 fL Final  . MCH 07/06/2018 30.1  27.0 - 33.0 pg Final  .  MCHC 07/06/2018 33.5  32.0 - 36.0 g/dL Final  . RDW 07/06/2018 12.7  11.0 - 15.0 % Final  . Platelets 07/06/2018 241  140 - 400 Thousand/uL Final  . MPV 07/06/2018 9.6  7.5 - 12.5 fL Final  . Neutro Abs 07/06/2018 4,700  1,500 - 7,800 cells/uL Final  . Lymphs Abs 07/06/2018 876  850 - 3,900 cells/uL Final  . Absolute Monocytes 07/06/2018 580  200 - 950 cells/uL Final  . Eosinophils Absolute 07/06/2018 113  15 - 500 cells/uL Final  . Basophils Absolute 07/06/2018 32  0 - 200 cells/uL Final  . Neutrophils Relative % 07/06/2018 74.6  % Final  . Total Lymphocyte 07/06/2018 13.9  % Final  . Monocytes Relative 07/06/2018 9.2  % Final  . Eosinophils Relative 07/06/2018 1.8  % Final  . Basophils Relative 07/06/2018 0.5  % Final  .  Glucose, Bld 07/06/2018 96  65 - 99 mg/dL Final   Comment: .            Fasting reference interval .   . BUN 07/06/2018 22  7 - 25 mg/dL Final  . Creat 07/06/2018 1.43* 0.60 - 0.88 mg/dL Final   Comment: For patients >48 years of age, the reference limit for Creatinine is approximately 13% higher for people identified as African-American. .   . GFR, Est Non African American 07/06/2018 35* > OR = 60 mL/min/1.46m2 Final  . GFR, Est African American 07/06/2018 40* > OR = 60 mL/min/1.74m2 Final  . BUN/Creatinine Ratio 07/06/2018 15  6 - 22 (calc) Final  . Sodium 07/06/2018 130* 135 - 146 mmol/L Final  . Potassium 07/06/2018 4.4  3.5 - 5.3 mmol/L Final  . Chloride 07/06/2018 96* 98 - 110 mmol/L Final  . CO2 07/06/2018 22  20 - 32 mmol/L Final  . Calcium 07/06/2018 9.2  8.6 - 10.4 mg/dL Final  . Total Protein 07/06/2018 7.1  6.1 - 8.1 g/dL Final  . Albumin 07/06/2018 3.7  3.6 - 5.1 g/dL Final  . Globulin 07/06/2018 3.4  1.9 - 3.7 g/dL (calc) Final  . AG Ratio 07/06/2018 1.1  1.0 - 2.5 (calc) Final  . Total Bilirubin 07/06/2018 0.8  0.2 - 1.2 mg/dL Final  . Alkaline phosphatase (APISO) 07/06/2018 109  37 - 153 U/L Final  . AST 07/06/2018 19  10 - 35 U/L Final  . ALT 07/06/2018 9  6 - 29 U/L Final  . TSH 07/06/2018 2.58  0.40 - 4.50 mIU/L Final  . B burgdorferi IgG Abs (IB) 07/06/2018 NEGATIVE  NEGATIVE Final  . Lyme Disease 18 kD IgG 07/06/2018 NON-REACTIVE   Final  . Lyme Disease 23 kD IgG 07/06/2018 NON-REACTIVE   Final  . Lyme Disease 28 kD IgG 07/06/2018 NON-REACTIVE   Final  . Lyme Disease 30 kD IgG 07/06/2018 NON-REACTIVE   Final  . Lyme Disease 39 kD IgG 07/06/2018 NON-REACTIVE   Final  . Lyme Disease 41 kD IgG 07/06/2018 REACTIVE*  Final  . Lyme Disease 45 kD IgG 07/06/2018 NON-REACTIVE   Final  . Lyme Disease 58 kD IgG 07/06/2018 NON-REACTIVE   Final  . Lyme Disease 66 kD IgG 07/06/2018 NON-REACTIVE   Final  . Lyme Disease 93 kD IgG 07/06/2018 NON-REACTIVE   Final  . B  burgdorferi IgM Abs (IB) 07/06/2018 NEGATIVE  NEGATIVE Final  . Lyme Disease 23 kD IgM 07/06/2018 NON-REACTIVE   Final  . Lyme Disease 39 kD IgM 07/06/2018 NON-REACTIVE   Final  . Lyme Disease 41 kD  IgM 07/06/2018 NON-REACTIVE   Final   Comment: As per CDC criteria, a Lyme disease IgG Immunoblot must show reactivity to at least 5 of 10 specific borrelial proteins to be considered positive; similarly, a  positive Lyme disease IgM immunoblot requires reactivity to 2 of 3 specific borrelial proteins. Although considered negative, IgG reactivity to fewer specific borrelial proteins or IgM reactivity to only 1 protein may indicate recent B. burgdorferi infection and warrant testing of a later sample. A positive IgM but negative IgG result obtained more than a month after onset of symptoms likely represents a false- positive IgM result rather than acute Lyme disease. In rare instances, Lyme disease immunoblot reactivity may represent antibodies induced by exposure to other spirochetes.  . Lyme immunoblot testing should only be performed on samples from patients who have had a Positive or Equivocal result in a screening assay.   . Sed Rate 07/06/2018 39* 0 - 30 mm/h Final   07/14/18 Labs showed stable chronic kidney disease, stable anemia, negative Lyme titers, otherwise normal except for an elevated sed rate.  This was mildly elevated however I did start the patient empirically on prednisone to see if there would be significant benefit to her polyarthralgias suggesting underlying PMR.  Her chest x-ray was also clear.  Labs did show a low sodium level of 130 and we recommended water restriction less than 1200 mL a day.  Patient is here today with her daughter for follow-up.  She reports that her joint pains have improved greater than 80 or 90%.  She feels so much better since starting prednisone.  She is only been on it since Tuesday.  To me this seems to indicate that the pain in her shoulders  and her fatigue could have been PMR rather than osteoarthritis.  Her appetite has improved on prednisone despite only taking 10 mg a day.  She also states that her cough has improved since she started Protonix for the laryngo-esophageal reflux. Past Medical History:  Diagnosis Date  . Atrial fibrillation ([place])   . Breast cancer ([place]) 1990  . Chest pain   . Colon cancer ([place])    ileostomy 2000  . Dysrhythmia   . History of colon cancer   . Hypercholesterolemia   . Hypothyroidism   . Multiple lacunar infarcts ([place])   . MVP (mitral valve prolapse)   . Osteoporosis    osteopenia  . Personal history of chemotherapy    6 months  . Personal history of radiation therapy    6 months  . Renal cell carcinoma (HCC)    stage 1 s/p resection Dr. Tresa Moore 2020  . SOB (shortness of breath)    Past Surgical History:  Procedure Laterality Date  . BREAST BIOPSY    . BREAST LUMPECTOMY Left   . COLON SURGERY  2000   ileostomy  . ILEOSTOMY    . ROBOTIC ASSITED PARTIAL NEPHRECTOMY Right 02/01/2018   Procedure: XI ROBOTIC ASSISTED RETROPERITONEAL PARTIAL NEPHRECTOMY;  Surgeon: Alexis Frock, MD;  Location: [facility];  Service: Urology;  Laterality: Right;   Current Outpatient Medications on File Prior to Visit  Medication Sig Dispense Refill  . atorvastatin (LIPITOR) 20 MG tablet TAKE 1 TABLET BY MOUTH ONCE DAILY (Patient not taking: Reported on 07/06/2018) 90 tablet 3  . HYDROcodone-acetaminophen (NORCO) 5-325 MG tablet Take 1-2 tablets by mouth every 6 (six) hours as needed for moderate pain or severe pain. (Patient not taking: Reported on 07/06/2018) 30 tablet 0  . levothyroxine (SYNTHROID, LEVOTHROID) 100  MCG tablet TAKE 1 TABLET BY MOUTH EVERY OTHER DAY 45 tablet 2  . levothyroxine (SYNTHROID, LEVOTHROID) 112 MCG tablet TAKE 1 TABLET BY MOUTH EVERY OTHER DAY 45 tablet 3  . loperamide (IMODIUM) 2 MG capsule TAKE 2 CAPSULES BY MOUTH AS NEEDED FOR DIARRHEA OR LOOSE STOOLS. DO NOT TAKE MORE THAN 4 TABS/DAY  (Patient not taking: No sig reported) 30 capsule 2  . metoprolol tartrate (LOPRESSOR) 25 MG tablet TAKE ONE TABLET BY MOUTH TWICE DAILY (Patient taking differently: Take 12.5 mg by mouth 2 (two) times daily. ) 180 tablet 3  . mometasone-formoterol (DULERA) 100-5 MCG/ACT AERO Inhale 2 puffs into the lungs 2 (two) times daily. 3 Inhaler 3  . pantoprazole (PROTONIX) 40 MG tablet Take 1 tablet (40 mg total) by mouth daily. 30 tablet 3  . predniSONE (DELTASONE) 10 MG tablet Take 1 tablet (10 mg total) by mouth daily with breakfast. 7 tablet 0  . promethazine (PHENERGAN) 12.5 MG tablet Take 1 tablet (12.5 mg total) by mouth every 8 (eight) hours as needed for nausea or vomiting. (Patient not taking: Reported on 07/06/2018) 20 tablet 0   No current facility-administered medications on file prior to visit.    Allergies  Allergen Reactions  . Crestor [Rosuvastatin Calcium] Other (See Comments)    Joint pain, effected liver function   . Rosuvastatin Other (See Comments)    Joint pain, effected liver function  . Zetia [Ezetimibe] Other (See Comments)    Joint pain, effected liver function    Social History   Socioeconomic History  . Marital status: Married    Spouse name: Not on file  . Number of children: Not on file  . Years of education: Not on file  . Highest education level: Not on file  Occupational History  . Not on file  Social Needs  . Financial resource strain: Not on file  . Food insecurity    Worry: Not on file    Inability: Not on file  . Transportation needs    Medical: Not on file    Non-medical: Not on file  Tobacco Use  . Smoking status: Never Smoker  . Smokeless tobacco: Never Used  Substance and Sexual Activity  . Alcohol use: No  . Drug use: No  . Sexual activity: Not on file  Lifestyle  . Physical activity    Days per week: Not on file    Minutes per session: Not on file  . Stress: Not on file  Relationships  . Social Herbalist on phone: Not on  file    Gets together: Not on file    Attends religious service: Not on file    Active member of club or organization: Not on file    Attends meetings of clubs or organizations: Not on file    Relationship status: Not on file  . Intimate partner violence    Fear of current or ex partner: Not on file    Emotionally abused: Not on file    Physically abused: Not on file    Forced sexual activity: Not on file  Other Topics Concern  . Not on file  Social History Narrative  . Not on file      Review of Systems  Respiratory: Positive for cough.   All other systems reviewed and are negative.      Objective:   Physical Exam Vitals signs reviewed.  Constitutional:      Appearance: She is well-developed.  Neck:  Thyroid: No thyromegaly.     Vascular: No JVD.  Cardiovascular:     Rate and Rhythm: Normal rate and regular rhythm.     Heart sounds: Normal heart sounds. No murmur. No friction rub. No gallop.   Pulmonary:     Effort: Pulmonary effort is normal. No respiratory distress.     Breath sounds: Normal breath sounds. No stridor. No wheezing or rales.  Abdominal:     General: Bowel sounds are normal. There is no distension.     Palpations: Abdomen is soft. There is no mass.     Tenderness: There is no abdominal tenderness. There is no guarding or rebound.     Hernia: No hernia is present.  Neurological:     Mental Status: She is alert and oriented to person, place, and time.     Cranial Nerves: No cranial nerve deficit.     Motor: No abnormal muscle tone.     Coordination: Coordination normal.     Deep Tendon Reflexes: Reflexes normal.           Assessment & Plan:  The primary encounter diagnosis was Weight loss. Diagnoses of Cough, Polyarthralgia, and Low sodium levels were also pertinent to this visit. I believe the patient shoulder pain and fatigue could be PMR given her dramatic response to low-dose prednisone.  Begin by gradually weaning the patient off  prednisone every week.  She will take 10 mg a day for 1 week then 9 mg a day for 1 week then 8 mg a day for 1 week and so on until she has completely weaned off the prednisone.  I explained this in detail to her daughter and gave her an outstanding prescription for 5 mg tablets and ramp tablet so that they can create the appropriate dose.  Patient's daughter understands how to administer the prednisone and not to do follow the directions written on the bottle.  I believe the cough is due to laryngo-esophageal reflux and has already improved since starting a PPI.  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  We will check a sodium level after fluid restriction to evaluate further.  Consider adrenal insufficiency if her fatigue continues and her sodium levels remain low

## 2018-07-15 LAB — BASIC METABOLIC PANEL WITH GFR
BUN/Creatinine Ratio: 25 (calc) — ABNORMAL HIGH (ref 6–22)
BUN: 35 mg/dL — ABNORMAL HIGH (ref 7–25)
CO2: 22 mmol/L (ref 20–32)
Calcium: 9 mg/dL (ref 8.6–10.4)
Chloride: 103 mmol/L (ref 98–110)
Creat: 1.41 mg/dL — ABNORMAL HIGH (ref 0.60–0.88)
GFR, Est African American: 41 mL/min/{1.73_m2} — ABNORMAL LOW (ref >=60)
GFR, Est Non African American: 35 mL/min/{1.73_m2} — ABNORMAL LOW (ref >=60)
Glucose, Bld: 147 mg/dL — ABNORMAL HIGH (ref 65–99)
Potassium: 4.1 mmol/L (ref 3.5–5.3)
Sodium: 136 mmol/L (ref 135–146)

## 2018-08-24 ENCOUNTER — Other Ambulatory Visit: Payer: Self-pay

## 2018-08-24 DIAGNOSIS — E038 Other specified hypothyroidism: Secondary | ICD-10-CM

## 2018-08-24 MED ORDER — LEVOTHYROXINE SODIUM 100 MCG PO TABS: 100.0000 ug | ORAL_TABLET | ORAL | 1 refills | Status: DC

## 2018-10-17 ENCOUNTER — Other Ambulatory Visit: Payer: Self-pay

## 2018-10-19 ENCOUNTER — Other Ambulatory Visit: Payer: Self-pay

## 2018-10-19 ENCOUNTER — Ambulatory Visit (INDEPENDENT_AMBULATORY_CARE_PROVIDER_SITE_OTHER): Payer: Medicare Other | Admitting: *Deleted

## 2018-10-19 DIAGNOSIS — Z23 Encounter for immunization: Secondary | ICD-10-CM | POA: Diagnosis not present

## 2018-10-19 NOTE — Progress Notes (Signed)
Patient seen in office for Influenza Vaccination.   Tolerated IM administration well.   Immunization history updated.  

## 2018-11-10 ENCOUNTER — Telehealth: Payer: Self-pay | Admitting: Family Medicine

## 2018-11-10 NOTE — Telephone Encounter (Signed)
Dominica Severin from [facility] did a A1C on patient this week and it was 6.6 she states that she discussed with patient diet and exercise. She stated that the patient hasn't been diagnosed with diabetes.   CB# [phone]

## 2018-11-10 NOTE — Telephone Encounter (Signed)
See note

## 2018-11-13 NOTE — Telephone Encounter (Signed)
Patient has developed mild diabetes.  Medications are not necessary.  Would work on diet and exercise to manage.  Would be glad to see her to discuss.

## 2018-11-15 NOTE — Telephone Encounter (Signed)
Discussed with husband in his ov

## 2018-12-12 ENCOUNTER — Other Ambulatory Visit: Payer: Self-pay

## 2018-12-12 DIAGNOSIS — E038 Other specified hypothyroidism: Secondary | ICD-10-CM

## 2018-12-12 MED ORDER — LEVOTHYROXINE SODIUM 112 MCG PO TABS: 112.0000 ug | ORAL_TABLET | ORAL | 0 refills | Status: DC

## 2018-12-12 MED ORDER — CLOPIDOGREL BISULFATE 75 MG PO TABS: 75.0000 mg | ORAL_TABLET | Freq: Every day | ORAL | 0 refills | Status: DC

## 2018-12-12 MED ORDER — LEVOTHYROXINE SODIUM 100 MCG PO TABS: 100.0000 ug | ORAL_TABLET | ORAL | 0 refills | Status: DC

## 2018-12-12 MED ORDER — ATORVASTATIN CALCIUM 20 MG PO TABS: 20.0000 mg | ORAL_TABLET | Freq: Every day | ORAL | 0 refills | Status: DC

## 2018-12-12 MED ORDER — METOPROLOL TARTRATE 25 MG PO TABS: 12.5000 mg | ORAL_TABLET | Freq: Two times a day (BID) | ORAL | 0 refills | Status: DC

## 2018-12-12 NOTE — Telephone Encounter (Signed)
Requested Prescriptions   Pending Prescriptions Disp Refills  . atorvastatin (LIPITOR) 20 MG tablet 90 tablet 0    Sig: Take 1 tablet (20 mg total) by mouth daily.  . clopidogrel (PLAVIX) 75 MG tablet 90 tablet 0    Sig: Take 1 tablet (75 mg total) by mouth daily.   Signed Prescriptions Disp Refills  . levothyroxine (SYNTHROID) 112 MCG tablet 45 tablet 0    Sig: Take 1 tablet (112 mcg total) by mouth every other day.    Authorizing Provider: Jenna Luo T    Ordering User: Vanice Sarah levothyroxine (SYNTHROID) 100 MCG tablet 45 tablet 0    Sig: Take 1 tablet (100 mcg total) by mouth every other day.    Authorizing Provider: Jenna Luo T    Ordering User: Dyke Maes C  . metoprolol tartrate (LOPRESSOR) 25 MG tablet 90 tablet 0    Sig: Take 0.5 tablets (12.5 mg total) by mouth 2 (two) times daily.    Authorizing Provider: Jenna Luo T    Ordering User: Dyke Maes C     Pt called wanting a rx refill on atorvastatin and clopidrogrel and I noticed these were not on pt's med list. Is it ok to fill? Pt has scheduled a physical for 01/30/2019.

## 2019-01-30 ENCOUNTER — Ambulatory Visit (INDEPENDENT_AMBULATORY_CARE_PROVIDER_SITE_OTHER): Payer: Medicare Other | Admitting: Family Medicine

## 2019-01-30 ENCOUNTER — Other Ambulatory Visit: Payer: Self-pay

## 2019-01-30 ENCOUNTER — Encounter: Payer: Self-pay | Admitting: Family Medicine

## 2019-01-30 VITALS — BP 122/70 | HR 60 | Temp 96.8°F | Resp 16 | Ht 66.0 in | Wt 142.0 lb

## 2019-01-30 DIAGNOSIS — Z0000 Encounter for general adult medical examination without abnormal findings: Secondary | ICD-10-CM

## 2019-01-30 DIAGNOSIS — C649 Malignant neoplasm of unspecified kidney, except renal pelvis: Secondary | ICD-10-CM

## 2019-01-30 DIAGNOSIS — E038 Other specified hypothyroidism: Secondary | ICD-10-CM

## 2019-01-30 DIAGNOSIS — M8580 Other specified disorders of bone density and structure, unspecified site: Secondary | ICD-10-CM

## 2019-01-30 DIAGNOSIS — Z8673 Personal history of transient ischemic attack (TIA), and cerebral infarction without residual deficits: Secondary | ICD-10-CM

## 2019-01-30 NOTE — Progress Notes (Signed)
Subjective:    Patient ID: Jocelyn Moss, female    DOB: 1935/04/09, 84 y.o.   MRN: [id number]  HPI Patient presents today for medicine check.  Past medical history is significant for a remote lacunar infarct that was discovered coincidentally during a work-up for memory loss.  For this, the patient is on Plavix for secondary prevention of stroke and is also on Lipitor for management and secondary prevention of stroke.  Of note, last year on her physical exam her creatinine was elevated slightly.  Follow-up renal ultrasound revealed a renal mass that turned out to be renal cell carcinoma.  She underwent a partial nephrectomy for this and has done well postoperatively.  Regarding her memory loss, both she and her daughter say that this is stable.  However her blood pressure is a little on the low side and she does occasionally have dizziness.  She takes metoprolol occasionally for PVCs.  She has a reported history of atrial fibrillation however she is in normal sinus rhythm with no evidence of atrial fibrillation at present.  Therefore I do not feel that she requires metoprolol any further.  I am concerned about orthostatic hypotension and falls.  Patient is currently taking a half a tablet twice a day.  We discussed at length gradually trying to wean the patient off of this.  Her immunization records are included below.  She denies any falls.  She denies any depression Immunization History  Administered Date(s) Administered  . Fluad Quad(high Dose 65+) 10/19/2018  . Influenza Whole 10/30/2008, 10/26/2011  . Influenza, High Dose Seasonal PF 11/02/2017  . Influenza,inj,Quad PF,6+ Mos 10/12/2012, 10/23/2013, 11/12/2014, 10/28/2015, 10/22/2016  . Pneumococcal Conjugate-13 01/01/2013  . Pneumococcal Polysaccharide-23 03/01/2005, 01/26/2007  . Tdap 12/17/2009  . Zoster 03/14/2006  . Zoster Recombinat (Shingrix) 02/10/2017, 05/12/2017    Past Medical History:  Diagnosis Date  . Atrial fibrillation  ([place])   . Breast cancer ([place]) 1990  . Chest pain   . Colon cancer ([place])    ileostomy 2000  . Dysrhythmia   . History of colon cancer   . Hypercholesterolemia   . Hypothyroidism   . Multiple lacunar infarcts ([place])   . MVP (mitral valve prolapse)   . Osteoporosis    osteopenia  . Personal history of chemotherapy    6 months  . Personal history of radiation therapy    6 months  . Renal cell carcinoma (HCC)    stage 1 s/p resection Dr. Tresa Moore 2020  . SOB (shortness of breath)    Past Surgical History:  Procedure Laterality Date  . BREAST BIOPSY    . BREAST LUMPECTOMY Left   . COLON SURGERY  2000   ileostomy  . ILEOSTOMY    . ROBOTIC ASSITED PARTIAL NEPHRECTOMY Right 02/01/2018   Procedure: XI ROBOTIC ASSISTED RETROPERITONEAL PARTIAL NEPHRECTOMY;  Surgeon: Alexis Frock, MD;  Location: [facility];  Service: Urology;  Laterality: Right;   Current Outpatient Medications on File Prior to Visit  Medication Sig Dispense Refill  . atorvastatin (LIPITOR) 20 MG tablet Take 1 tablet (20 mg total) by mouth daily. 90 tablet 0  . clopidogrel (PLAVIX) 75 MG tablet Take 1 tablet (75 mg total) by mouth daily. 90 tablet 0  . levothyroxine (SYNTHROID) 100 MCG tablet Take 1 tablet (100 mcg total) by mouth every other day. 45 tablet 0  . levothyroxine (SYNTHROID) 112 MCG tablet Take 1 tablet (112 mcg total) by mouth every other day. 45 tablet 0  . metoprolol tartrate (LOPRESSOR)  25 MG tablet Take 0.5 tablets (12.5 mg total) by mouth 2 (two) times daily. 90 tablet 0  . mometasone-formoterol (DULERA) 100-5 MCG/ACT AERO Inhale 2 puffs into the lungs 2 (two) times daily. 3 Inhaler 3   No current facility-administered medications on file prior to visit.   Allergies  Allergen Reactions  . Crestor [Rosuvastatin Calcium] Other (See Comments)    Joint pain, effected liver function   . Rosuvastatin Other (See Comments)    Joint pain, effected liver function  . Zetia [Ezetimibe] Other (See Comments)     Joint pain, effected liver function    Social History   Socioeconomic History  . Marital status: Married    Spouse name: Not on file  . Number of children: Not on file  . Years of education: Not on file  . Highest education level: Not on file  Occupational History  . Not on file  Tobacco Use  . Smoking status: Never Smoker  . Smokeless tobacco: Never Used  Substance and Sexual Activity  . Alcohol use: No  . Drug use: No  . Sexual activity: Not on file  Other Topics Concern  . Not on file  Social History Narrative  . Not on file   Social Determinants of Health   Financial Resource Strain:   . Difficulty of Paying Living Expenses: Not on file  Food Insecurity:   . Worried About Charity fundraiser in the Last Year: Not on file  . Ran Out of Food in the Last Year: Not on file  Transportation Needs:   . Lack of Transportation (Medical): Not on file  . Lack of Transportation (Non-Medical): Not on file  Physical Activity:   . Days of Exercise per Week: Not on file  . Minutes of Exercise per Session: Not on file  Stress:   . Feeling of Stress : Not on file  Social Connections:   . Frequency of Communication with Friends and Family: Not on file  . Frequency of Social Gatherings with Friends and Family: Not on file  . Attends Religious Services: Not on file  . Active Member of Clubs or Organizations: Not on file  . Attends Archivist Meetings: Not on file  . Marital Status: Not on file  Intimate Partner Violence:   . Fear of Current or Ex-Partner: Not on file  . Emotionally Abused: Not on file  . Physically Abused: Not on file  . Sexually Abused: Not on file      Review of Systems  All other systems reviewed and are negative.      Objective:   Physical Exam Vitals reviewed.  Constitutional:      Appearance: She is well-developed.  Neck:     Thyroid: No thyromegaly.     Vascular: No JVD.  Cardiovascular:     Rate and Rhythm: Normal rate and  regular rhythm.     Heart sounds: Normal heart sounds. No murmur. No friction rub. No gallop.   Pulmonary:     Effort: Pulmonary effort is normal. No respiratory distress.     Breath sounds: Normal breath sounds. No stridor. No wheezing or rales.  Abdominal:     General: Bowel sounds are normal. There is no distension.     Palpations: Abdomen is soft. There is no mass.     Tenderness: There is no abdominal tenderness. There is no guarding or rebound.     Hernia: No hernia is present.  Neurological:     Mental Status:  She is alert and oriented to person, place, and time.     Cranial Nerves: No cranial nerve deficit.     Motor: No abnormal muscle tone.     Coordination: Coordination normal.     Deep Tendon Reflexes: Reflexes normal.           Assessment & Plan:  History of CVA (cerebrovascular accident) - Plan: CBC with Differential, COMPLETE METABOLIC PANEL WITH GFR, Lipid Panel  Other specified hypothyroidism - Plan: TSH  Renal cell carcinoma, unspecified laterality (HCC)  Osteopenia, unspecified location  General medical exam  Recheck a TSH today to ensure adequate dosage of her levothyroxine.  Check a CBC to rule out any anemia or blood draws on the Plavix.  Check a CMP to monitor her liver function test and blood sugar.  Check a fasting lipid panel to ensure that her LDL cholesterol is less than 70.  Given her history of osteopenia I encouraged the patient to take 1200 mg a day of calcium and 1000 units a day of vitamin D.  Her immunizations are up-to-date.  Her memory loss appears stable suggesting underlying vascular dementia which is mild at best.  I am concerned about orthostatic dizziness due to her poor appetite.  Therefore I recommended trying to wean the patient off the metoprolol as she does not appear to need this for rate or rhythm control.  Decrease to one half a tablet a day for 2 to 3 weeks and if she is tolerating this change discontinue the medication altogether.   Daughter will help monitor this.  I also recommended that she drink 1 Ensure a day to help prevent protein calorie malnutrition.  The remainder of her preventative care is up-to-date for age.

## 2019-01-31 LAB — CBC WITH DIFFERENTIAL/PLATELET
Absolute Monocytes: 512 cells/uL (ref 200–950)
Basophils Absolute: 39 cells/uL (ref 0–200)
Basophils Relative: 0.7 %
Eosinophils Absolute: 160 cells/uL (ref 15–500)
Eosinophils Relative: 2.9 %
HCT: 34.6 % — ABNORMAL LOW (ref 35.0–45.0)
Hemoglobin: 11.5 g/dL — ABNORMAL LOW (ref 11.7–15.5)
Lymphs Abs: 1210 cells/uL (ref 850–3900)
MCH: 31 pg (ref 27.0–33.0)
MCHC: 33.2 g/dL (ref 32.0–36.0)
MCV: 93.3 fL (ref 80.0–100.0)
MPV: 9.8 fL (ref 7.5–12.5)
Monocytes Relative: 9.3 %
Neutro Abs: 3581 cells/uL (ref 1500–7800)
Neutrophils Relative %: 65.1 %
Platelets: 183 10*3/uL (ref 140–400)
RBC: 3.71 10*6/uL — ABNORMAL LOW (ref 3.80–5.10)
RDW: 12.2 % (ref 11.0–15.0)
Total Lymphocyte: 22 %
WBC: 5.5 10*3/uL (ref 3.8–10.8)

## 2019-01-31 LAB — COMPLETE METABOLIC PANEL WITH GFR
AG Ratio: 1.3 (calc) (ref 1.0–2.5)
ALT: 13 U/L (ref 6–29)
AST: 20 U/L (ref 10–35)
Albumin: 4.1 g/dL (ref 3.6–5.1)
Alkaline phosphatase (APISO): 122 U/L (ref 37–153)
BUN/Creatinine Ratio: 18 (calc) (ref 6–22)
BUN: 23 mg/dL (ref 7–25)
CO2: 24 mmol/L (ref 20–32)
Calcium: 9.6 mg/dL (ref 8.6–10.4)
Chloride: 104 mmol/L (ref 98–110)
Creat: 1.29 mg/dL — ABNORMAL HIGH (ref 0.60–0.88)
GFR, Est African American: 45 mL/min/{1.73_m2} — ABNORMAL LOW (ref >=60)
GFR, Est Non African American: 39 mL/min/{1.73_m2} — ABNORMAL LOW (ref >=60)
Globulin: 3.1 g/dL (calc) (ref 1.9–3.7)
Glucose, Bld: 91 mg/dL (ref 65–99)
Potassium: 4.5 mmol/L (ref 3.5–5.3)
Sodium: 136 mmol/L (ref 135–146)
Total Bilirubin: 0.8 mg/dL (ref 0.2–1.2)
Total Protein: 7.2 g/dL (ref 6.1–8.1)

## 2019-01-31 LAB — LIPID PANEL
Cholesterol: 149 mg/dL (ref <200)
HDL: 68 mg/dL (ref >=50)
LDL Cholesterol (Calc): 65 mg/dL (calc)
Non-HDL Cholesterol (Calc): 81 mg/dL (calc) (ref <130)
Total CHOL/HDL Ratio: 2.2 (calc) (ref <5.0)
Triglycerides: 77 mg/dL (ref <150)

## 2019-01-31 LAB — TSH: TSH: 2.27 mIU/L (ref 0.40–4.50)

## 2019-03-06 ENCOUNTER — Other Ambulatory Visit: Payer: Self-pay | Admitting: Family Medicine

## 2019-03-06 DIAGNOSIS — E038 Other specified hypothyroidism: Secondary | ICD-10-CM

## 2019-03-06 MED ORDER — LEVOTHYROXINE SODIUM 100 MCG PO TABS: 100.0000 ug | ORAL_TABLET | ORAL | 0 refills | Status: DC

## 2019-03-10 ENCOUNTER — Other Ambulatory Visit: Payer: Self-pay | Admitting: Family Medicine

## 2019-03-19 ENCOUNTER — Other Ambulatory Visit: Payer: Self-pay | Admitting: Family Medicine

## 2019-03-19 DIAGNOSIS — E038 Other specified hypothyroidism: Secondary | ICD-10-CM

## 2019-04-03 ENCOUNTER — Other Ambulatory Visit: Payer: Self-pay

## 2019-04-03 MED ORDER — DULERA 100-5 MCG/ACT IN AERO: 2.0000 | INHALATION_SPRAY | Freq: Two times a day (BID) | RESPIRATORY_TRACT | 3 refills | Status: DC

## 2019-05-16 ENCOUNTER — Other Ambulatory Visit: Payer: Self-pay | Admitting: Family Medicine

## 2019-05-16 MED ORDER — DULERA 100-5 MCG/ACT IN AERO: 2.0000 | INHALATION_SPRAY | Freq: Two times a day (BID) | RESPIRATORY_TRACT | 3 refills | Status: DC

## 2019-05-18 ENCOUNTER — Ambulatory Visit: Payer: Medicare Other | Admitting: Family Medicine

## 2019-05-18 ENCOUNTER — Ambulatory Visit
Admission: RE | Admit: 2019-05-18 | Discharge: 2019-05-18 | Disposition: A | Discharge status: Home / Self Care | Payer: Medicare Other | Source: Ambulatory Visit | Attending: Family Medicine | Admitting: Family Medicine

## 2019-05-18 ENCOUNTER — Other Ambulatory Visit: Payer: Self-pay

## 2019-05-18 ENCOUNTER — Encounter: Payer: Self-pay | Admitting: Family Medicine

## 2019-05-18 VITALS — BP 160/84 | HR 76 | Temp 96.7°F | Resp 14 | Ht 66.0 in | Wt 143.0 lb

## 2019-05-18 DIAGNOSIS — I499 Cardiac arrhythmia, unspecified: Secondary | ICD-10-CM

## 2019-05-18 DIAGNOSIS — S00201A Unspecified superficial injury of right eyelid and periocular area, initial encounter: Secondary | ICD-10-CM

## 2019-05-18 DIAGNOSIS — R059 Cough, unspecified: Secondary | ICD-10-CM

## 2019-05-18 DIAGNOSIS — R05 Cough: Secondary | ICD-10-CM

## 2019-05-18 DIAGNOSIS — T148XXA Other injury of unspecified body region, initial encounter: Secondary | ICD-10-CM

## 2019-05-18 NOTE — Progress Notes (Signed)
Subjective:    Patient ID: Jocelyn Moss, female    DOB: 1935/10/13, 84 y.o.   MRN: [id number]  HPI Past medical history is significant for a remote lacunar infarct that was discovered coincidentally during a work-up for memory loss.  For this, the patient is on Plavix for secondary prevention of stroke and is also on Lipitor for management and secondary prevention of stroke.  Of note, last year on her physical exam her creatinine was elevated slightly.  Follow-up renal ultrasound revealed a renal mass that turned out to be renal cell carcinoma.  She underwent a partial nephrectomy for this and has done well postoperatively.    Patient woke up this morning with bruising under her right eye.  The right eyelid is purple and swollen with bruising.  The bruising extends onto the upper right cheek.  She denies any injury to the eye.  She denies any pain in the.  There is no subconjunctival hemorrhage.  There is no pain with extraocular movements.  There is no bruising above the or in the right upper eyelid.  The bruise is approximately 3 cm x 2 cm.  Is a coincidental finding on her physical exam she appears to be tachycardic and in a regular rhythm. Immunization History  Administered Date(s) Administered  . Fluad Quad(high Dose 65+) 10/19/2018  . Influenza Whole 10/30/2008, 10/26/2011  . Influenza, High Dose Seasonal PF 11/02/2017  . Influenza,inj,Quad PF,6+ Mos 10/12/2012, 10/23/2013, 11/12/2014, 10/28/2015, 10/22/2016  . Pneumococcal Conjugate-13 01/01/2013  . Pneumococcal Polysaccharide-23 03/01/2005, 01/26/2007  . Tdap 12/17/2009  . Zoster 03/14/2006  . Zoster Recombinat (Shingrix) 02/10/2017, 05/12/2017    Past Medical History:  Diagnosis Date  . Atrial fibrillation ([place])   . Breast cancer ([place]) 1990  . Chest pain   . Colon cancer ([place])    ileostomy 2000  . Dysrhythmia   . History of colon cancer   . Hypercholesterolemia   . Hypothyroidism   . Multiple lacunar infarcts ([place])   .  MVP (mitral valve prolapse)   . Osteoporosis    osteopenia  . Personal history of chemotherapy    6 months  . Personal history of radiation therapy    6 months  . Renal cell carcinoma (HCC)    stage 1 s/p resection Dr. Tresa Moore 2020  . SOB (shortness of breath)    Past Surgical History:  Procedure Laterality Date  . BREAST BIOPSY    . BREAST LUMPECTOMY Left   . COLON SURGERY  2000   ileostomy  . ILEOSTOMY    . ROBOTIC ASSITED PARTIAL NEPHRECTOMY Right 02/01/2018   Procedure: XI ROBOTIC ASSISTED RETROPERITONEAL PARTIAL NEPHRECTOMY;  Surgeon: Alexis Frock, MD;  Location[facility];  Service: Urology;  Laterality: Right;   Current Outpatient Medications on File Prior to Visit  Medication Sig Dispense Refill  . atorvastatin (LIPITOR) 20 MG tablet TAKE 1 TABLET BY MOUTH EVERY DAY 90 tablet 0  . clopidogrel (PLAVIX) 75 MG tablet TAKE 1 TABLET BY MOUTH EVERY DAY 90 tablet 0  . levothyroxine (SYNTHROID) 100 MCG tablet TAKE 1 TABLET BY MOUTH EVERY OTHER DAY. 45 tablet 3  . levothyroxine (SYNTHROID) 112 MCG tablet TAKE 1 TABLET BY MOUTH EVERY OTHER DAY. 45 tablet 0  . metoprolol tartrate (LOPRESSOR) 25 MG tablet TAKE 1/2 TABLET (12.5 MG) BY MOUTH 2 TIMES DAILY. 90 tablet 0  . mometasone-formoterol (DULERA) 100-5 MCG/ACT AERO Inhale 2 puffs into the lungs 2 (two) times daily. 39 g 3   No current facility-administered  medications on file prior to visit.   Allergies  Allergen Reactions  . Crestor [Rosuvastatin Calcium] Other (See Comments)    Joint pain, effected liver function   . Rosuvastatin Other (See Comments)    Joint pain, effected liver function  . Zetia [Ezetimibe] Other (See Comments)    Joint pain, effected liver function    Social History   Socioeconomic History  . Marital status: Married    Spouse name: Not on file  . Number of children: Not on file  . Years of education: Not on file  . Highest education level: Not on file  Occupational History  . Not on file  Tobacco  Use  . Smoking status: Never Smoker  . Smokeless tobacco: Never Used  Substance and Sexual Activity  . Alcohol use: No  . Drug use: No  . Sexual activity: Not on file  Other Topics Concern  . Not on file  Social History Narrative  . Not on file   Social Determinants of Health   Financial Resource Strain:   . Difficulty of Paying Living Expenses:   Food Insecurity:   . Worried About Charity fundraiser in the Last Year:   . Arboriculturist in the Last Year:   Transportation Needs:   . Film/video editor (Medical):   Marland Kitchen Lack of Transportation (Non-Medical):   Physical Activity:   . Days of Exercise per Week:   . Minutes of Exercise per Session:   Stress:   . Feeling of Stress :   Social Connections:   . Frequency of Communication with Friends and Family:   . Frequency of Social Gatherings with Friends and Family:   . Attends Religious Services:   . Active Member of Clubs or Organizations:   . Attends Archivist Meetings:   Marland Kitchen Marital Status:   Intimate Partner Violence:   . Fear of Current or Ex-Partner:   . Emotionally Abused:   Marland Kitchen Physically Abused:   . Sexually Abused:       Review of Systems  All other systems reviewed and are negative.      Objective:   Physical Exam Vitals reviewed.  Constitutional:      Appearance: She is well-developed.  Eyes:   Neck:     Thyroid: No thyromegaly.     Vascular: No JVD.  Cardiovascular:     Rate and Rhythm: Tachycardia present. Rhythm irregular.     Heart sounds: Normal heart sounds. No murmur. No friction rub. No gallop.   Pulmonary:     Effort: Pulmonary effort is normal. No respiratory distress.     Breath sounds: Normal breath sounds. No stridor. No wheezing or rales.  Abdominal:     General: Bowel sounds are normal. There is no distension.     Palpations: Abdomen is soft. There is no mass.     Tenderness: There is no abdominal tenderness. There is no guarding or rebound.     Hernia: No hernia is  present.  Neurological:     Mental Status: She is alert and oriented to person, place, and time.     Cranial Nerves: No cranial nerve deficit.     Motor: No abnormal muscle tone.     Coordination: Coordination normal.     Deep Tendon Reflexes: Reflexes normal.           Assessment & Plan:  Irregular heart rhythm - Plan: EKG 12-Lead, CBC with Differential/Platelet, COMPLETE METABOLIC PANEL WITH GFR, TSH, Ambulatory referral  to Cardiology  Cough - Plan: DG Chest 2 View  Bruising  The bruising around the patient's eye should resolve gradually on its own.  I did recommend that she temporarily hold her Plavix to avoid it from worsening.  Recheck on Monday.  She also reports an occasional cough.  She has a history of chronic cough however she also has a history of renal cell carcinoma and therefore the patient would like to obtain a chest x-ray to rule out metastasis.  While I am not concerned about metastasis, I do believe that a chest x-ray is reasonable for a chronic cough.  In the past she has done well with a combination of antihistamines, proton pump inhibitors, and taking her regular Dulera.  Regarding the irregular heart rhythm, her EKG today shows normal sinus rhythm with no evidence of ischemia or infarction.  However on her exam, occasionally she will have brief runs of tachycardia as well as irregular skipped beats.  I am not sure if she is having PSVT or frequent PVCs or possibly episodes of A. fib.  Therefore I will schedule her to meet with a cardiologist for an event monitor.  If she is having episodes of atrial fibrillation, I would discontinue Plavix and replace with Eliquis.  Likely needs to resume metoprolol however I would like to see her performance on the monitor first.  I did tell her that we have to send to cardiology for monitoring right even though monitor is an option they want to see him

## 2019-05-19 LAB — CBC WITH DIFFERENTIAL/PLATELET
Absolute Monocytes: 515 cells/uL (ref 200–950)
Basophils Absolute: 28 cells/uL (ref 0–200)
Basophils Relative: 0.5 %
Eosinophils Absolute: 140 cells/uL (ref 15–500)
Eosinophils Relative: 2.5 %
HCT: 33.1 % — ABNORMAL LOW (ref 35.0–45.0)
Hemoglobin: 10.8 g/dL — ABNORMAL LOW (ref 11.7–15.5)
Lymphs Abs: 874 cells/uL (ref 850–3900)
MCH: 29.9 pg (ref 27.0–33.0)
MCHC: 32.6 g/dL (ref 32.0–36.0)
MCV: 91.7 fL (ref 80.0–100.0)
MPV: 9.7 fL (ref 7.5–12.5)
Monocytes Relative: 9.2 %
Neutro Abs: 4043 cells/uL (ref 1500–7800)
Neutrophils Relative %: 72.2 %
Platelets: 209 10*3/uL (ref 140–400)
RBC: 3.61 10*6/uL — ABNORMAL LOW (ref 3.80–5.10)
RDW: 12.2 % (ref 11.0–15.0)
Total Lymphocyte: 15.6 %
WBC: 5.6 10*3/uL (ref 3.8–10.8)

## 2019-05-19 LAB — COMPLETE METABOLIC PANEL WITH GFR
AG Ratio: 1.2 (calc) (ref 1.0–2.5)
ALT: 16 U/L (ref 6–29)
AST: 23 U/L (ref 10–35)
Albumin: 3.8 g/dL (ref 3.6–5.1)
Alkaline phosphatase (APISO): 132 U/L (ref 37–153)
BUN/Creatinine Ratio: 21 (calc) (ref 6–22)
BUN: 28 mg/dL — ABNORMAL HIGH (ref 7–25)
CO2: 26 mmol/L (ref 20–32)
Calcium: 9.6 mg/dL (ref 8.6–10.4)
Chloride: 100 mmol/L (ref 98–110)
Creat: 1.36 mg/dL — ABNORMAL HIGH (ref 0.60–0.88)
GFR, Est African American: 42 mL/min/{1.73_m2} — ABNORMAL LOW (ref >=60)
GFR, Est Non African American: 36 mL/min/{1.73_m2} — ABNORMAL LOW (ref >=60)
Globulin: 3.2 g/dL (calc) (ref 1.9–3.7)
Glucose, Bld: 86 mg/dL (ref 65–99)
Potassium: 4 mmol/L (ref 3.5–5.3)
Sodium: 137 mmol/L (ref 135–146)
Total Bilirubin: 0.7 mg/dL (ref 0.2–1.2)
Total Protein: 7 g/dL (ref 6.1–8.1)

## 2019-05-19 LAB — TSH: TSH: 2.05 mIU/L (ref 0.40–4.50)

## 2019-05-21 ENCOUNTER — Telehealth: Payer: Self-pay | Admitting: Family Medicine

## 2019-05-21 ENCOUNTER — Other Ambulatory Visit: Payer: Self-pay | Admitting: Family Medicine

## 2019-05-21 DIAGNOSIS — J9819 Other pulmonary collapse: Secondary | ICD-10-CM

## 2019-05-21 NOTE — Telephone Encounter (Signed)
Daughter called and questions answered

## 2019-05-21 NOTE — Telephone Encounter (Signed)
[phone] Patients daughter calling to ask questions about blood thinner  And about the black eye she has

## 2019-05-23 ENCOUNTER — Ambulatory Visit: Payer: Medicare Other | Admitting: Interventional Cardiology

## 2019-05-23 ENCOUNTER — Encounter: Payer: Self-pay | Admitting: *Deleted

## 2019-05-23 ENCOUNTER — Other Ambulatory Visit: Payer: Self-pay

## 2019-05-23 ENCOUNTER — Encounter: Payer: Self-pay | Admitting: Interventional Cardiology

## 2019-05-23 VITALS — BP 126/64 | HR 72 | Ht 66.0 in | Wt 142.4 lb

## 2019-05-23 DIAGNOSIS — I341 Nonrheumatic mitral (valve) prolapse: Secondary | ICD-10-CM | POA: Diagnosis not present

## 2019-05-23 DIAGNOSIS — G459 Transient cerebral ischemic attack, unspecified: Secondary | ICD-10-CM | POA: Diagnosis not present

## 2019-05-23 DIAGNOSIS — I499 Cardiac arrhythmia, unspecified: Secondary | ICD-10-CM | POA: Diagnosis not present

## 2019-05-23 NOTE — Progress Notes (Signed)
Cardiology Office Note   Date:  05/23/2019   ID:  Callaway, Hardigree 20-Sep-1935, MRN [id number]  PCP:  Susy Frizzle, MD    No chief complaint on file.  AFib  Wt Readings from Last 3 Encounters:  05/23/19 142 lb 6.4 oz (64.6 kg)  05/18/19 143 lb (64.9 kg)  01/30/19 142 lb (64.4 kg)       History of Present Illness: Jocelyn Moss is a 84 y.o. female who is being seen today for the evaluation of atrial fibrillation/mitral valve prolapse at the request of Susy Frizzle, MD.  She was seen by Dr. Acie Fredrickson in the past.  Last visit was in 2014.  She has been on a blood thinner since that time.   She has had a spontaneous black eye recently so her anticoagulation was stopped for a few days.  When seen by PMD, there was significant heart rate variability during the visit, and monitor was suggested. The patient did not feel any sx with the HR variability.  She had been on metoprolol but it was tapered off in Jan 2021 when HR was 60 and BP was 122/79.   Denies : Chest pain. Dizziness. Leg edema. Nitroglycerin use. Orthopnea. Palpitations. Paroxysmal nocturnal dyspnea. Syncope.   She does have some DOE with walking.  No other bleeding issues.  No blood in stool or urine.   She has been on Plavix but has not been on Coumadin, Eliquis, or Xarelto.    Past Medical History:  Diagnosis Date  . Atrial fibrillation ([place])   . Breast cancer ([place]) 1990  . Chest pain   . Colon cancer ([place])    ileostomy 2000  . Dysrhythmia   . History of colon cancer   . Hypercholesterolemia   . Hypothyroidism   . Multiple lacunar infarcts ([place])   . MVP (mitral valve prolapse)   . Osteoporosis    osteopenia  . Personal history of chemotherapy    6 months  . Personal history of radiation therapy    6 months  . Renal cell carcinoma (HCC)    stage 1 s/p resection Dr. Tresa Moore 2020  . SOB (shortness of breath)     Past Surgical History:  Procedure Laterality Date  . BREAST BIOPSY    .  BREAST LUMPECTOMY Left   . COLON SURGERY  2000   ileostomy  . ILEOSTOMY    . ROBOTIC ASSITED PARTIAL NEPHRECTOMY Right 02/01/2018   Procedure: XI ROBOTIC ASSISTED RETROPERITONEAL PARTIAL NEPHRECTOMY;  Surgeon: Alexis Frock, MD;  [facility];  Service: Urology;  Laterality: Right;     Current Outpatient Medications  Medication Sig Dispense Refill  . atorvastatin (LIPITOR) 20 MG tablet TAKE 1 TABLET BY MOUTH EVERY DAY 90 tablet 0  . clopidogrel (PLAVIX) 75 MG tablet TAKE 1 TABLET BY MOUTH EVERY DAY 90 tablet 0  . levothyroxine (SYNTHROID) 100 MCG tablet TAKE 1 TABLET BY MOUTH EVERY OTHER DAY. 45 tablet 3  . levothyroxine (SYNTHROID) 112 MCG tablet TAKE 1 TABLET BY MOUTH EVERY OTHER DAY. 45 tablet 0  . mometasone-formoterol (DULERA) 100-5 MCG/ACT AERO Inhale 2 puffs into the lungs 2 (two) times daily. 39 g 3  . Multiple Vitamins-Minerals (WOMENS BONE HEALTH PO) Take by mouth.     No current facility-administered medications for this visit.    Allergies:   Crestor [rosuvastatin calcium], Rosuvastatin, and Zetia [ezetimibe]    Social History:  The patient  reports that she has never smoked. She has  never used smokeless tobacco. She reports that she does not drink alcohol or use drugs.   Family History:  The patient's family history includes Breast cancer in her sister; Lung cancer in her father, mother, and sister.    ROS:  Please see the history of present illness.   Otherwise, review of systems are positive for easy bruising.   All other systems are reviewed and negative.    PHYSICAL EXAM: VS:  BP 126/64   Pulse 72   Ht 5\' 6"  (1.676 m)   Wt 142 lb 6.4 oz (64.6 kg)   SpO2 98%   BMI 22.98 kg/m  , BMI Body mass index is 22.98 kg/m. GEN: Well nourished, well developed, in no acute distress  HEENT: bruising under right eye Neck: no JVD, carotid bruits, or masses Cardiac: RRR; no murmurs, rubs, or gallops,no edema  Respiratory:  clear to auscultation bilaterally, normal work  of breathing GI: soft, nontender, nondistended, + BS MS: no deformity or atrophy  Skin: warm and dry, no rash Neuro:  Strength and sensation are intact Psych: euthymic mood, full affect   EKG:   The ekg ordered in 4/21 demonstrates NSR   Recent Labs: 05/18/2019: ALT 16; BUN 28; Creat 1.36; Hemoglobin 10.8; Platelets 209; Potassium 4.0; Sodium 137; TSH 2.05   Lipid Panel    Component Value Date/Time   CHOL 149 01/30/2019 1014   TRIG 77 01/30/2019 1014   HDL 68 01/30/2019 1014   CHOLHDL 2.2 01/30/2019 1014   VLDL 13 06/01/2016 0900   LDLCALC 65 01/30/2019 1014   LDLDIRECT 151.1 05/25/2011 0933     Other studies Reviewed: Additional studies/ records that were reviewed today with results demonstrating: Prior records reviewed.  LDL 77 in January 2021.   ASSESSMENT AND PLAN:  1. Irregular heart rhythm:  AFib is mentioned in the chart (past medical history), but after review of all ECGs back to 2013, there is no AFib on any ECG.  Dr. Elmarie Shiley notes from 2013 and 2104 do not mention AFib, only MVP.  She has never been on Coumadin, Eliquis, Xarelto, Pradaxa- which also goes along with no h/o AFib.  Plan for 2 week Zio patch to check for AFib, and to see what HR does. Will decide about need for metoprolol.  If she maintains sinus rhythm, she may not need metoprolol at all.  I don't think this is related to her partial collapse of right middle lobe of lung. 2. BP variable.  Controlled today.  She reports it was 962X systolic at Dr. Samella Parr office.  Continue to check more regularly at home. 3. TIA in the past- this is likely why she is on clopidogrel.  Nuisance bleeding noted.  No serious, internal bleeding noted. 4. Mitral valve prolapse: This is also mention in the past.  No recent echocardiogram available.  Last echo in the chart was from 2004.  No sign of CHF. 5. Patient requests that her daughter, Davy Faught, has access to her records.  Her phone number is [phone].   Current  medicines are reviewed at length with the patient today.  The patient concerns regarding her medicines were addressed.  The following changes have been made:  No change  Labs/ tests ordered today include:  No orders of the defined types were placed in this encounter.   Recommend 150 minutes/week of aerobic exercise Low fat, low carb, high fiber diet recommended  Disposition:   FU in based on results   Signed, Larae Grooms, MD  05/23/2019 8:46 AM    [address], [place], [place]  [address] Phone: [phone]; Fax: [phone]

## 2019-05-23 NOTE — Patient Instructions (Signed)
Medication Instructions:  Your physician recommends that you continue on your current medications as directed. Please refer to the Current Medication list given to you today.  *If you need a refill on your cardiac medications before your next appointment, please call your pharmacy*   Lab Work: None ordered  If you have labs (blood work) drawn today and your tests are completely normal, you will receive your results only by: Marland Kitchen MyChart Message (if you have MyChart) OR . A paper copy in the mail If you have any lab test that is abnormal or we need to change your treatment, we will call you to review the results.   Testing/Procedures: Your physician has recommended that you wear a 14 day monitor. These monitors are medical devices that record the heart's electrical activity. Doctors most often use these monitors to diagnose arrhythmias. Arrhythmias are problems with the speed or rhythm of the heartbeat. The monitor is a small, portable device. You can wear one while you do your normal daily activities. This is usually used to diagnose what is causing palpitations/syncope (passing out).   Follow-Up: Based on monitor results  Other Instructions ZIO XT- Long Term Monitor Instructions   Your physician has requested you wear your ZIO patch monitor 14 days.   This is a single patch monitor.  [employer] supplies one patch monitor per enrollment.  Additional stickers are not available.   Please do not apply patch if you will be having a Nuclear Stress Test, Echocardiogram, Cardiac CT, MRI, or Chest Xray during the time frame you would be wearing the monitor. The patch cannot be worn during these tests.  You cannot remove and re-apply the ZIO XT patch monitor.   Your ZIO patch monitor will be sent USPS Priority mail from [facility] directly to your home address. The monitor may also be mailed to a PO BOX if home delivery is not available.   It may take 3-5 days to receive your monitor after  you have been enrolled.   Once you have received you monitor, please review enclosed instructions.  Your monitor has already been registered assigning a specific monitor serial # to you.   Applying the monitor   Shave hair from upper left chest.   Hold abrader disc by orange tab.  Rub abrader in 40 strokes over left upper chest as indicated in your monitor instructions.   Clean area with 4 enclosed alcohol pads .  Use all pads to assure are is cleaned thoroughly.  Let dry.   Apply patch as indicated in monitor instructions.  Patch will be place under collarbone on left side of chest with arrow pointing upward.   Rub patch adhesive wings for 2 minutes.Remove white label marked "1".  Remove white label marked "2".  Rub patch adhesive wings for 2 additional minutes.   While looking in a mirror, press and release button in center of patch.  A small green light will flash 3-4 times .  This will be your only indicator the monitor has been turned on.     Do not shower for the first 24 hours.  You may shower after the first 24 hours.   Press button if you feel a symptom. You will hear a small click.  Record Date, Time and Symptom in the Patient Log Book.   When you are ready to remove patch, follow instructions on last 2 pages of Patient Log Book.  Stick patch monitor onto last page of Patient Log Book.  Place Patient Log Book in [place] box.  Use locking tab on box and tape box closed securely.  The [employer] and [employer] has [employer] on it.  Please place in mailbox as soon as possible.  Your physician should have your test results approximately 7 days after the monitor has been mailed back to [facility].   Call [place] at [phone] if you have questions regarding your ZIO XT patch monitor.  Call them immediately if you see an orange light blinking on your monitor.   If your monitor falls off in less than 4 days contact our Monitor department at [phone].  If  your monitor becomes loose or falls off after 4 days call [employer] at [phone] for suggestions on securing your monitor.

## 2019-05-23 NOTE — Progress Notes (Signed)
Patient ID: Jocelyn Moss, female   DOB: 1935/08/16, 84 y.o.   MRN: [id number] 14 day ZIO XT long term holter monitor to be mailed to her home.

## 2019-05-26 ENCOUNTER — Ambulatory Visit (INDEPENDENT_AMBULATORY_CARE_PROVIDER_SITE_OTHER): Payer: Medicare Other

## 2019-05-26 DIAGNOSIS — I499 Cardiac arrhythmia, unspecified: Secondary | ICD-10-CM

## 2019-05-28 ENCOUNTER — Telehealth: Payer: Self-pay | Admitting: Family Medicine

## 2019-05-28 NOTE — Telephone Encounter (Signed)
CB # [phone] Levada Dy pt  daughter call for mother xray results

## 2019-05-29 MED ORDER — AZITHROMYCIN 250 MG PO TABS
ORAL_TABLET | ORAL | 0 refills | Status: DC
Start: 2019-05-29 — End: 2019-06-06

## 2019-05-29 NOTE — Telephone Encounter (Signed)
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

## 2019-05-29 NOTE — Telephone Encounter (Signed)
I would start protonix 40 mg a day

## 2019-05-29 NOTE — Telephone Encounter (Signed)
Jocelyn Moss wants to know if she should be on a PPI - you were going to rx it in June of last year but for some reason it was not done.    Angela aware and med sent to [facility]

## 2019-05-29 NOTE — Telephone Encounter (Signed)
Spoke to dtr and she states that the "patient is coughing her head off" and she is taking robitussin qid. She has apt with pulmonary on 06/08/2019 but would like something before then. She states that it "looks painful" but pt does not complain about. No fever or any other symptoms.  She also wanted to know if they saw any type of tumour on her CXR?

## 2019-05-30 MED ORDER — PANTOPRAZOLE SODIUM 40 MG PO TBEC
40.0000 mg | DELAYED_RELEASE_TABLET | Freq: Every day | ORAL | 3 refills | Status: DC
Start: 2019-05-30 — End: 2020-05-23

## 2019-05-30 NOTE — Telephone Encounter (Signed)
Pt's dtr aware and med sent to [facility]

## 2019-06-06 ENCOUNTER — Other Ambulatory Visit: Payer: Self-pay

## 2019-06-06 ENCOUNTER — Encounter: Payer: Self-pay | Admitting: Pulmonary Disease

## 2019-06-06 ENCOUNTER — Ambulatory Visit: Payer: Medicare Other | Admitting: Pulmonary Disease

## 2019-06-06 ENCOUNTER — Encounter: Payer: Self-pay | Admitting: *Deleted

## 2019-06-06 DIAGNOSIS — J90 Pleural effusion, not elsewhere classified: Secondary | ICD-10-CM

## 2019-06-06 MED ORDER — FLUTTER DEVI
1.0000 | 0 refills | Status: AC
Start: 2019-06-06 — End: ?

## 2019-06-06 NOTE — Patient Instructions (Addendum)
I have reviewed your lung imaging which shows recurrent collapse of the middle part of your right lung. This has occurred in 2010 as well and you had undergone a procedure called bronchoscopy  I suspect you have something called right middle lobe syndrome which causes obstruction of the airway and repeated collapse of that area We will get CT chest without contrast for further evaluation We will give you a flutter valve which she can use 3 times a day along with Mucinex over-the-counter for clearance of secretion We will also check sputum cultures for AFB, fungus and regular cultures  Follow-up in 2 to 4 weeks.

## 2019-06-06 NOTE — Progress Notes (Signed)
LAQUANNA VEAZEY    [id number]    September 28, 1935  Primary Care Physician:Pickard, Cammie Mcgee, MD  Referring Physician: Susy Frizzle, MD [address] [place],  [place] [address]  Chief complaint: Consult for abnormal chest x-ray  HPI: 84 year old with history of atrial fibrillation, breast cancer, renal cell cancer Referred for abnormal chest x-ray with recurrent right middle lobe collapse. She had a chest x-ray earlier this year for cough which showed right middle lobe opacity Chief complaint is chronic cough with occasional sputum production.  Denies any fevers, chills She got a course of azithromycin on May 29, 2019 with no improvement. History notable for fall in March with black eye.  Plavix was held briefly.  Previously evaluated by Dr. Chase Caller in pulmonary clinic in 2010 for similar complaints with CT showing right middle lobe infiltrate, debris is in the airways.  She underwent bronchoscopy on 12/27/2008: Bronch BAL - necrotic cells and debris. No malignant cells. 720 cells, 51% PMN. Micro negative.   History of  right breast cancer treated with lumpectomy, chemoradiation in 1991 Colon cancer s/p colectomy, ostomy placement in 2000 Renal cell cancer treated with partial nephrectomy in 2020  Pets: No pets Occupation: Worked for an Human resources officer.  Currently retired Exposures: Exposure to [employer] on farm as a child.  No ongoing exposures.  No down pillows or comforters, mold, hot tub, Jacuzzi Smoking history: Never smoker Travel history: No significant travel history Relevant family history: Sister had probable hypersensitivity pneumonitis from chicken exposure.  Mother had chronic lung disease which was thought to be TB but not confirmed.  Father had lung cancer from smoking.  Outpatient Encounter Medications as of 06/06/2019  Medication Sig  . atorvastatin (LIPITOR) 20 MG tablet TAKE 1 TABLET BY MOUTH EVERY DAY  . clopidogrel (PLAVIX) 75 MG  tablet TAKE 1 TABLET BY MOUTH EVERY DAY  . levothyroxine (SYNTHROID) 100 MCG tablet TAKE 1 TABLET BY MOUTH EVERY OTHER DAY.  Marland Kitchen levothyroxine (SYNTHROID) 112 MCG tablet TAKE 1 TABLET BY MOUTH EVERY OTHER DAY.  . mometasone-formoterol (DULERA) 100-5 MCG/ACT AERO Inhale 2 puffs into the lungs 2 (two) times daily.  . Multiple Vitamins-Minerals (WOMENS BONE HEALTH PO) Take by mouth.  . pantoprazole (PROTONIX) 40 MG tablet Take 1 tablet (40 mg total) by mouth daily.  . [DISCONTINUED] azithromycin (ZITHROMAX) 250 MG tablet 2 tabs po x 1 day; 1 tab po qd x days 2-5  (DX: J89 -  right middle lobe syndrome)   No facility-administered encounter medications on file as of 06/06/2019.    Allergies as of 06/06/2019 - Review Complete 06/06/2019  Allergen Reaction Noted  . Crestor [rosuvastatin calcium] Other (See Comments) 06/15/2010  . Rosuvastatin Other (See Comments) 12/23/2008  . Zetia [ezetimibe] Other (See Comments) 06/15/2010    Past Medical History:  Diagnosis Date  . Atrial fibrillation ([place])   . Breast cancer ([facility]) 1990  . Chest pain   . Colon cancer ([place] [facility])    ileostomy 2000  . Dysrhythmia   . History of colon cancer   . Hypercholesterolemia   . Hypothyroidism   . Multiple lacunar infarcts ([place])   . MVP (mitral valve prolapse)   . Osteoporosis    osteopenia  . Personal history of chemotherapy    6 months  . Personal history of radiation therapy    6 months  . Renal cell carcinoma ([facility])    stage 1 s/p resection Dr. Tresa Moore 2020  .  SOB (shortness of breath)     Past Surgical History:  Procedure Laterality Date  . BREAST BIOPSY    . BREAST LUMPECTOMY Left   . COLON SURGERY  2000   ileostomy  . ILEOSTOMY    . ROBOTIC ASSITED PARTIAL NEPHRECTOMY Right 02/01/2018   Procedure: XI ROBOTIC ASSISTED RETROPERITONEAL PARTIAL NEPHRECTOMY;  Surgeon: Alexis Frock, MD;  Location: [facility];  Service: Urology;  Laterality: Right;    Family History  Problem Relation Age of Onset  . Lung  cancer Mother   . Lung cancer Father   . Lung cancer Sister   . Breast cancer Sister     Social History   Socioeconomic History  . Marital status: Married    Spouse name: Not on file  . Number of children: Not on file  . Years of education: Not on file  . Highest education level: Not on file  Occupational History  . Not on file  Tobacco Use  . Smoking status: Never Smoker  . Smokeless tobacco: Never Used  Substance and Sexual Activity  . Alcohol use: No  . Drug use: No  . Sexual activity: Not on file  Other Topics Concern  . Not on file  Social History Narrative  . Not on file   Social Determinants of Health   Financial Resource Strain:   . Difficulty of Paying Living Expenses:   Food Insecurity:   . Worried About Charity fundraiser in the Last Year:   . Arboriculturist in the Last Year:   Transportation Needs:   . Film/video editor (Medical):   Marland Kitchen Lack of Transportation (Non-Medical):   Physical Activity:   . Days of Exercise per Week:   . Minutes of Exercise per Session:   Stress:   . Feeling of Stress :   Social Connections:   . Frequency of Communication with Friends and Family:   . Frequency of Social Gatherings with Friends and Family:   . Attends Religious Services:   . Active Member of Clubs or Organizations:   . Attends Archivist Meetings:   Marland Kitchen Marital Status:   Intimate Partner Violence:   . Fear of Current or Ex-Partner:   . Emotionally Abused:   Marland Kitchen Physically Abused:   . Sexually Abused:     Review of systems: Review of Systems  Constitutional: Negative for fever and chills.  HENT: Negative.   Eyes: Negative for blurred vision.  Respiratory: as per HPI  Cardiovascular: Negative for chest pain and palpitations.  Gastrointestinal: Negative for vomiting, diarrhea, blood per rectum. Genitourinary: Negative for dysuria, urgency, frequency and hematuria.  Musculoskeletal: Negative for myalgias, back pain and joint pain.  Skin:  Negative for itching and rash.  Neurological: Negative for dizziness, tremors, focal weakness, seizures and loss of consciousness.  Endo/Heme/Allergies: Negative for environmental allergies.  Psychiatric/Behavioral: Negative for depression, suicidal ideas and hallucinations.  All other systems reviewed and are negative.  Physical Exam: Blood pressure 134/74, pulse 65, temperature (!) 97.2 F (36.2 C), temperature source Temporal, height 5\' 6"  (1.676 m), weight 143 lb 9.6 oz (65.1 kg), SpO2 99 %. Gen:      No acute distress HEENT:  EOMI, sclera anicteric Neck:     No masses; no thyromegaly Lungs:    Clear to auscultation bilaterally; normal respiratory effort CV:         Regular rate and rhythm; no murmurs Abd:      + bowel sounds; soft, non-tender; no palpable masses,  no distension Ext:    No edema; adequate peripheral perfusion Skin:      Warm and dry; no rash Neuro: alert and oriented x 3 Psych: normal mood and affect  Data Reviewed: Imaging: CT chest 12/17/2008-volume loss in the right middle lobe with debris is in the airway. CT chest 02/27/2009-no acute lung abnormality Chest x-ray 04/11/2012 hyperinflated lung Chest x-ray 07/06/2018-opacity in the right middle lobe Chest x-ray 05/18/2019-opacity in the right middle lobe compatible with collapse. I have reviewed the images personally.  PFTs:  Labs:  Assessment:  Right middle lobe collapse I have reviewed her lung imaging which shows recurrent collapse of the right middle lobe This has occurred in 2010 as well and she had undergone bronchoscopy with the finding of necrotic debris and no endobronchial lesion.  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  May need repeat bronchoscopy based on that result.  Plavix will need to be held before procedure. Give flutter valve which she can use  3 times a day along with Mucinex over-the-counter for clearance of secretion Check sputum cultures for AFB, fungus and regular cultures  Plan reviewed in detail with patient and daughter today.   Plan/Recommendations: CT chest Flutter valve, Mucinex Sputum cultures  Marshell Garfinkel MD [facility] 06/06/2019, 10:34 AM  CC: Susy Frizzle, MD

## 2019-06-10 ENCOUNTER — Other Ambulatory Visit: Payer: Self-pay | Admitting: Family Medicine

## 2019-06-10 DIAGNOSIS — E038 Other specified hypothyroidism: Secondary | ICD-10-CM

## 2019-06-11 ENCOUNTER — Other Ambulatory Visit: Payer: Medicare Other

## 2019-06-11 DIAGNOSIS — J90 Pleural effusion, not elsewhere classified: Secondary | ICD-10-CM

## 2019-06-13 ENCOUNTER — Other Ambulatory Visit: Payer: Self-pay | Admitting: Family Medicine

## 2019-06-13 MED ORDER — CLOPIDOGREL BISULFATE 75 MG PO TABS: 75.0000 mg | ORAL_TABLET | Freq: Every day | ORAL | 3 refills | Status: DC

## 2019-06-13 MED ORDER — ATORVASTATIN CALCIUM 20 MG PO TABS: 20.0000 mg | ORAL_TABLET | Freq: Every day | ORAL | 2 refills | Status: DC

## 2019-06-14 LAB — RESPIRATORY CULTURE OR RESPIRATORY AND SPUTUM CULTURE
MICRO NUMBER:: 10485366
RESULT:: NORMAL
SPECIMEN QUALITY:: ADEQUATE

## 2019-06-18 ENCOUNTER — Other Ambulatory Visit: Payer: Self-pay

## 2019-06-18 ENCOUNTER — Ambulatory Visit
Admission: RE | Admit: 2019-06-18 | Discharge: 2019-06-18 | Disposition: A | Discharge status: Home / Self Care | Payer: Medicare Other | Source: Ambulatory Visit | Attending: Pulmonary Disease | Admitting: Pulmonary Disease

## 2019-06-18 DIAGNOSIS — J90 Pleural effusion, not elsewhere classified: Secondary | ICD-10-CM

## 2019-06-22 ENCOUNTER — Telehealth: Payer: Self-pay | Admitting: Pulmonary Disease

## 2019-06-22 NOTE — Telephone Encounter (Signed)
Called and spoke with pt's daughter Levada Dy who is requesting to know the results of pt's CT from 5/24. Dr. Vaughan Browner, please advise.

## 2019-06-22 NOTE — Telephone Encounter (Signed)
CT shows inflammatory changes in the right lung again that looks like chronic infection. She may need a repeat bronchoscope. Please make follow up in clinic to discuss

## 2019-06-22 NOTE — Telephone Encounter (Signed)
Called and spoke with patient's Daughter, Levada Dy (DPR). Dr Matilde Bash results and recommendations given.  Patient is scheduled 07/24/19, for follow up OV with Dr Vaughan Browner.  Nothing further at this time.

## 2019-07-04 ENCOUNTER — Other Ambulatory Visit: Payer: Self-pay

## 2019-07-04 MED ORDER — METOPROLOL TARTRATE 25 MG PO TABS
12.5000 mg | ORAL_TABLET | Freq: Two times a day (BID) | ORAL | 3 refills | Status: DC | PRN
Start: 2019-07-04 — End: 2020-09-08

## 2019-07-06 ENCOUNTER — Telehealth: Payer: Self-pay | Admitting: Interventional Cardiology

## 2019-07-06 NOTE — Telephone Encounter (Signed)
Called and spoke to daughter and made her aware of monitor results and recommendations to start metoprolol 12.5 mg BID prn palpitations. Patient will f/u in 1 year.

## 2019-07-06 NOTE — Telephone Encounter (Signed)
Follow Up:     Daughter called and said pt still have not received her Monitor results. She also said pt received her Metoprolol and was not aware she was supposed to be still taking it. She also wanted to know when is pt supposed to have her next office visit with Dr Irish Lack?

## 2019-07-09 ENCOUNTER — Telehealth: Payer: Self-pay | Admitting: Family Medicine

## 2019-07-09 LAB — FUNGUS CULTURE W SMEAR
MICRO NUMBER:: 10484974
SMEAR:: NONE SEEN
SPECIMEN QUALITY:: ADEQUATE

## 2019-07-09 NOTE — Telephone Encounter (Signed)
Levada Dy daughter of Mrs. Netherland would like to know if patient should take mitoQ. She states that the eye doctor advised patient to start taking this medicine.  CB# [phone]

## 2019-07-10 NOTE — Telephone Encounter (Signed)
Pt verbalized understanding of situation of taking new med

## 2019-07-10 NOTE — Telephone Encounter (Signed)
I think it would be fine to take mitoQ.  It will have no impact on her medications or medical problems.

## 2019-07-24 ENCOUNTER — Encounter: Payer: Self-pay | Admitting: *Deleted

## 2019-07-24 ENCOUNTER — Ambulatory Visit: Payer: Medicare Other | Admitting: Pulmonary Disease

## 2019-07-24 ENCOUNTER — Other Ambulatory Visit: Payer: Self-pay

## 2019-07-24 ENCOUNTER — Encounter: Payer: Self-pay | Admitting: Pulmonary Disease

## 2019-07-24 VITALS — BP 110/68 | HR 55 | Temp 98.1°F | Ht 67.0 in | Wt 141.2 lb

## 2019-07-24 DIAGNOSIS — R918 Other nonspecific abnormal finding of lung field: Secondary | ICD-10-CM | POA: Diagnosis not present

## 2019-07-24 DIAGNOSIS — J9819 Other pulmonary collapse: Secondary | ICD-10-CM

## 2019-07-24 NOTE — Addendum Note (Signed)
Addended by: Elton Sin on: 07/24/2019 10:55 AM   Modules accepted: Orders

## 2019-07-24 NOTE — Patient Instructions (Signed)
We discussed the CT scanning with findings of obstruction in the right lung Per our discussion we have decided to pursue conservative management and repeat CT scan in 2 months Continue using the flutter valve 3 times daily We will also give you an incentive spirometer.  Uses 3 times daily as well to open up the lung  CT scan without contrast in 2 months end of August Follow-up in clinic after CT scan

## 2019-07-24 NOTE — Progress Notes (Signed)
Jocelyn Moss    [id number]    02/21/1935  Primary Care Physician:Pickard, Cammie Mcgee, MD  Referring Physician: Susy Frizzle, MD [address] [place],  [place] [address]  Chief complaint: Consult for abnormal chest x-ray  HPI: 84 year old with history of atrial fibrillation, breast cancer, renal cell cancer Referred for abnormal chest x-ray with recurrent right middle lobe collapse. She had a chest x-ray earlier this year for cough which showed right middle lobe opacity Chief complaint is chronic cough with occasional sputum production.  Denies any fevers, chills She got a course of azithromycin on May 29, 2019 with no improvement. History notable for fall in March with black eye.  Plavix was held briefly.  Previously evaluated by Dr. Chase Caller in pulmonary clinic in 2010 for similar complaints with CT showing right middle lobe infiltrate, debris is in the airways.  She underwent bronchoscopy on 12/27/2008: Bronch BAL - necrotic cells and debris. No malignant cells. 720 cells, 51% PMN. Micro negative.   History of  right breast cancer treated with lumpectomy, chemoradiation in 1991 Colon cancer s/p colectomy, ostomy placement in 2000 Renal cell cancer treated with partial nephrectomy in 2020  Pets: No pets Occupation: Worked for an Human resources officer.  Currently retired Exposures: Exposure to [employer] on farm as a child.  No ongoing exposures.  No down pillows or comforters, mold, hot tub, Jacuzzi Smoking history: Never smoker Travel history: No significant travel history Relevant family history: Sister had probable hypersensitivity pneumonitis from chicken exposure.  Mother had chronic lung disease which was thought to be TB but not confirmed.  Father had lung cancer from smoking.  Interim history: States that breathing is doing better, improved cough.  Still has minimal dyspnea on exertion  Has seen Dr. Irish Lack, cardiology with a Zio patch which  did not show any abnormalities.  Recommendations are to use Lopressor as needed Cardiology notes reviewed  Outpatient Encounter Medications as of 07/24/2019  Medication Sig   atorvastatin (LIPITOR) 20 MG tablet Take 1 tablet (20 mg total) by mouth daily.   clopidogrel (PLAVIX) 75 MG tablet Take 1 tablet (75 mg total) by mouth daily.   levothyroxine (SYNTHROID) 100 MCG tablet TAKE 1 TABLET BY MOUTH EVERY OTHER DAY.   levothyroxine (SYNTHROID) 112 MCG tablet TAKE 1 TABLET BY MOUTH EVERY OTHER DAY.   metoprolol tartrate (LOPRESSOR) 25 MG tablet Take 0.5 tablets (12.5 mg total) by mouth 2 (two) times daily as needed (palpitations).   mometasone-formoterol (DULERA) 100-5 MCG/ACT AERO Inhale 2 puffs into the lungs 2 (two) times daily.   Multiple Vitamins-Minerals (WOMENS BONE HEALTH PO) Take by mouth.   pantoprazole (PROTONIX) 40 MG tablet Take 1 tablet (40 mg total) by mouth daily.   Respiratory Therapy Supplies (FLUTTER) DEVI 1 Device by Does not apply route as directed.   UNABLE TO FIND Med Name: Mito Q   No facility-administered encounter medications on file as of 07/24/2019.   Physical Exam: Blood pressure 110/68, pulse (!) 55, temperature 98.1 F (36.7 C), temperature source Oral, height 5\' 7"  (1.702 m), weight 141 lb 3.2 oz (64 kg), SpO2 99 %. Gen:      No acute distress HEENT:  EOMI, sclera anicteric Neck:     No masses; no thyromegaly Lungs:    Clear to auscultation bilaterally; normal respiratory effort CV:         Regular rate and rhythm; no murmurs Abd:      +  bowel sounds; soft, non-tender; no palpable masses, no distension Ext:    No edema; adequate peripheral perfusion Skin:      Warm and dry; no rash Neuro: alert and oriented x 3 Psych: normal mood and affect  Data Reviewed: Imaging: CT chest 12/17/2008-volume loss in the right middle lobe with debris is in the airway. CT chest 02/27/2009-no acute lung abnormality Chest x-ray 04/11/2012 hyperinflated lung Chest  x-ray 07/06/2018-opacity in the right middle lobe Chest x-ray 05/18/2019-opacity in the right middle lobe compatible with collapse. CT chest 06/18/2019-right middle lobe mucoid impaction with consolidation, endobronchial material.  Partially calcified anterior abdominal mass. I have reviewed the images personally.  PFTs:  Labs: Sputum culture 06/11/2019-negative AFB, Candida albicans, normal oropharyngeal flora  Assessment:  Right middle lobe collapse I have reviewed her lung imaging which shows recurrent collapse of the right middle lobe This has occurred in 2010 as well and she had undergone bronchoscopy with the finding of necrotic debris and no endobronchial lesion.  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  Recent sputum culture shows Candida which is likely a contaminant. Discussed CT findings in extensive detail with patient and wife.  Discussed risks and benefits of repeating bronchoscope.  She is at increased risk given age, frailty.  Based on informed decision making we have decided to hold off on procedure for now and manage conservatively with repeat CT scan without contrast in 2 months She will continue the flutter valve.  I will add incentive spirometer to open up the lung.  Plan/Recommendations: CT chest in 2 months Flutter valve, incentive spirometer, mucinex  MDM Reviewed: previous chart and vitals Reviewed previous: labs and CT scan Interpretation: CT scan Consults: cardiology   Marshell Garfinkel MD [facility] 07/24/2019, 10:19 AM  CC: Susy Frizzle, MD

## 2019-07-25 ENCOUNTER — Ambulatory Visit: Payer: Medicare Other | Admitting: Pulmonary Disease

## 2019-07-25 ENCOUNTER — Telehealth: Payer: Self-pay | Admitting: Pulmonary Disease

## 2019-07-25 NOTE — Telephone Encounter (Signed)
Patient daughter calling for results of sputum studies. Dr. Vaughan Browner, please advise.

## 2019-07-25 NOTE — Telephone Encounter (Signed)
Patient daughter called with results of recent sputum culture.

## 2019-07-25 NOTE — Telephone Encounter (Signed)
Sputum is growing yeast which is likely contaminant and not significant.

## 2019-07-27 LAB — AFB CULTURE WITH SMEAR (NOT AT ARMC)
Acid Fast Culture: NEGATIVE
Acid Fast Smear: NEGATIVE

## 2019-07-27 LAB — SPECIMEN STATUS REPORT

## 2019-09-04 ENCOUNTER — Other Ambulatory Visit: Payer: Self-pay | Admitting: Family Medicine

## 2019-09-04 DIAGNOSIS — E038 Other specified hypothyroidism: Secondary | ICD-10-CM

## 2019-09-21 ENCOUNTER — Ambulatory Visit
Admission: RE | Admit: 2019-09-21 | Discharge: 2019-09-21 | Disposition: A | Discharge status: Home / Self Care | Payer: Medicare Other | Source: Ambulatory Visit | Attending: Pulmonary Disease | Admitting: Pulmonary Disease

## 2019-09-21 ENCOUNTER — Other Ambulatory Visit: Payer: Self-pay

## 2019-09-21 DIAGNOSIS — J9819 Other pulmonary collapse: Secondary | ICD-10-CM

## 2019-09-24 ENCOUNTER — Encounter: Payer: Self-pay | Admitting: Pulmonary Disease

## 2019-09-24 ENCOUNTER — Other Ambulatory Visit: Payer: Self-pay

## 2019-09-24 ENCOUNTER — Telehealth: Payer: Self-pay | Admitting: Pulmonary Disease

## 2019-09-24 ENCOUNTER — Ambulatory Visit: Payer: Medicare Other | Admitting: Pulmonary Disease

## 2019-09-24 ENCOUNTER — Encounter: Payer: Self-pay | Admitting: *Deleted

## 2019-09-24 ENCOUNTER — Other Ambulatory Visit: Payer: Self-pay | Admitting: Pulmonary Disease

## 2019-09-24 VITALS — BP 120/70 | HR 66 | Temp 97.9°F | Ht 66.0 in | Wt 139.8 lb

## 2019-09-24 DIAGNOSIS — J9819 Other pulmonary collapse: Secondary | ICD-10-CM

## 2019-09-24 DIAGNOSIS — K769 Liver disease, unspecified: Secondary | ICD-10-CM

## 2019-09-24 DIAGNOSIS — R911 Solitary pulmonary nodule: Secondary | ICD-10-CM | POA: Diagnosis not present

## 2019-09-24 DIAGNOSIS — R0602 Shortness of breath: Secondary | ICD-10-CM

## 2019-09-24 DIAGNOSIS — IMO0001 Reserved for inherently not codable concepts without codable children: Secondary | ICD-10-CM

## 2019-09-24 LAB — COMPREHENSIVE METABOLIC PANEL
ALT: 16 U/L (ref 0–35)
AST: 25 U/L (ref 0–37)
Albumin: 4 g/dL (ref 3.5–5.2)
Alkaline Phosphatase: 103 U/L (ref 39–117)
BUN: 37 mg/dL — ABNORMAL HIGH (ref 6–23)
CO2: 28 mEq/L (ref 19–32)
Calcium: 10 mg/dL (ref 8.4–10.5)
Chloride: 102 mEq/L (ref 96–112)
Creatinine, Ser: 1.76 mg/dL — ABNORMAL HIGH (ref 0.40–1.20)
GFR: 27.63 mL/min — ABNORMAL LOW (ref >60.00)
Glucose, Bld: 80 mg/dL (ref 70–99)
Potassium: 4.7 mEq/L (ref 3.5–5.1)
Sodium: 137 mEq/L (ref 135–145)
Total Bilirubin: 0.8 mg/dL (ref 0.2–1.2)
Total Protein: 7.4 g/dL (ref 6.0–8.3)

## 2019-09-24 NOTE — Patient Instructions (Signed)
I have reviewed his CT scan which shows improvement in the opacity in the right lung.  There are areas of mild inflammation which can be observed We will get a CT chest without contrast in 6 months  As we discussed in addition there is possibly a liver lesion that will need further evaluation We will order a comprehensive metabolic panel today and MRI with, without contrast for evaluation of liver lesion  Follow-up in 6 months.

## 2019-09-24 NOTE — Addendum Note (Signed)
Addended by: Vanessa Barbara on: 09/24/2019 06:38 PM   Modules accepted: Orders

## 2019-09-24 NOTE — Progress Notes (Signed)
Jocelyn Moss    [id number]    03-Dec-1935  Primary Care Physician:Pickard, Cammie Mcgee, MD  Referring Physician: Susy Frizzle, MD [address] [place],  [place] [address]  Chief complaint: Consult for abnormal chest x-ray  HPI: 84 year old with history of atrial fibrillation, breast cancer, renal cell cancer Referred for abnormal chest x-ray with recurrent right middle lobe collapse. She had a chest x-ray earlier this year for cough which showed right middle lobe opacity Chief complaint is chronic cough with occasional sputum production.  Denies any fevers, chills She got a course of azithromycin on May 29, 2019 with no improvement. History notable for fall in March with black eye.  Plavix was held briefly.  Previously evaluated by Dr. Chase Caller in pulmonary clinic in 2010 for similar complaints with CT showing right middle lobe infiltrate, debris is in the airways.  She underwent bronchoscopy on 12/27/2008: Bronch BAL - necrotic cells and debris. No malignant cells. 720 cells, 51% PMN. Micro negative.   History of  right breast cancer treated with lumpectomy, chemoradiation in 1991 Colon cancer s/p colectomy, ostomy placement in 2000 Renal cell cancer treated with partial nephrectomy in 2020  Has seen Dr. Irish Lack, cardiology with a Zio patch which did not show any abnormalities.  Recommendations are to use Lopressor as needed Cardiology notes reviewed   Pets: No pets Occupation: Worked for an Human resources officer.  Currently retired Exposures: Exposure to [employer] on farm as a child.  No ongoing exposures.  No down pillows or comforters, mold, hot tub, Jacuzzi Smoking history: Never smoker Travel history: No significant travel history Relevant family history: Sister had probable hypersensitivity pneumonitis from chicken exposure.  Mother had chronic lung disease which was thought to be TB but not confirmed.  Father had lung cancer from  smoking.  Interim history: States that she is doing well.  Breathing is improved Using the flutter valve and incentive spirometer, Mucinex   Outpatient Encounter Medications as of 09/24/2019  Medication Sig  . atorvastatin (LIPITOR) 20 MG tablet Take 1 tablet (20 mg total) by mouth daily.  . clopidogrel (PLAVIX) 75 MG tablet Take 1 tablet (75 mg total) by mouth daily.  Marland Kitchen levothyroxine (SYNTHROID) 100 MCG tablet TAKE 1 TABLET BY MOUTH EVERY OTHER DAY.  Marland Kitchen levothyroxine (SYNTHROID) 112 MCG tablet TAKE 1 TABLET BY MOUTH EVERY OTHER DAY.  . metoprolol tartrate (LOPRESSOR) 25 MG tablet Take 0.5 tablets (12.5 mg total) by mouth 2 (two) times daily as needed (palpitations).  . mometasone-formoterol (DULERA) 100-5 MCG/ACT AERO Inhale 2 puffs into the lungs 2 (two) times daily.  . Multiple Vitamins-Minerals (WOMENS BONE HEALTH PO) Take by mouth.  . pantoprazole (PROTONIX) 40 MG tablet Take 1 tablet (40 mg total) by mouth daily.  Marland Kitchen Respiratory Therapy Supplies (FLUTTER) DEVI 1 Device by Does not apply route as directed.  Marland Kitchen UNABLE TO FIND Med Name: Mito Q   No facility-administered encounter medications on file as of 09/24/2019.   Physical Exam: Blood pressure 120/70, pulse 66, temperature 97.9 F (36.6 C), temperature source Temporal, height 5\' 6"  (1.676 m), weight 139 lb 12.8 oz (63.4 kg), SpO2 100 %. Gen:      No acute distress HEENT:  EOMI, sclera anicteric Neck:     No masses; no thyromegaly Lungs:    Clear to auscultation bilaterally; normal respiratory effort CV:         Regular rate and rhythm; no murmurs Abd:      +  bowel sounds; soft, non-tender; no palpable masses, no distension Ext:    No edema; adequate peripheral perfusion Skin:      Warm and dry; no rash Neuro: alert and oriented x 3 Psych: normal mood and affect  Data Reviewed: Imaging: CT chest 12/17/2008-volume loss in the right middle lobe with debris is in the airway. CT chest 02/27/2009-no acute lung abnormality Chest  x-ray 04/11/2012 hyperinflated lung Chest x-ray 07/06/2018-opacity in the right middle lobe Chest x-ray 05/18/2019-opacity in the right middle lobe compatible with collapse. CT chest 06/18/2019-right middle lobe mucoid impaction with consolidation, endobronchial material.  Partially calcified anterior abdominal mass. CT chest 09/21/2019-intrahepatic biliary duct dilatation with 3 cm hypoattenuation Improvement in consolidation of right middle lobe opacity, IV impaction and nodularity in the left upper lobe, lingula. I have reviewed the images personally.  PFTs:  Labs: Sputum culture 06/11/2019-negative AFB, Candida albicans, normal oropharyngeal flora  Assessment:  Right middle lobe collapse I have reviewed her lung imaging which shows recurrent collapse of the right middle lobe This has occurred in 2010 as well and she had undergone bronchoscopy with the finding of necrotic debris and no endobronchial lesion.  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  Recent sputum culture shows Candida which is likely a contaminant.  The latest scan shows improvement in right middle lobe consolidation with new patchy opacities elsewhere Discussed CT findings in extensive detail with patient and wife.  Discussed risks and benefits of repeating bronchoscope.  She is at increased risk given age, frailty.  Since symptomatically she is doing better we will continue conservative management. Continue flutter valve, incentive spirometry, Mucinex for mucociliary clearance  Liver lesion Her latest scan shows a new liver lesion which is concerning Discussed with patient and daughter We will get hepatic panel today for assessment and MRI  Plan/Recommendations: CT chest without contrast follow-up in 6 months for lung nodule Hepatic panel MRI abdomen  MDM Reviewed: previous chart and vitals Reviewed previous:  labs and CT scan Interpretation: CT scan Consults: primary care provider   Marshell Garfinkel MD [facility] 09/24/2019, 9:10 AM  CC: Susy Frizzle, MD

## 2019-09-25 ENCOUNTER — Telehealth: Payer: Self-pay | Admitting: *Deleted

## 2019-09-25 ENCOUNTER — Telehealth: Payer: Self-pay | Admitting: Pulmonary Disease

## 2019-09-25 NOTE — Telephone Encounter (Signed)
Encounter created in error. Will sign off.  

## 2019-09-25 NOTE — Telephone Encounter (Signed)
Called and spoke Levada Dy, Patient's daughter.  Levada Dy was wanting to review labs results and CT results. Dr Matilde Bash results and recommendations given. Levada Dy stated that someone called to schedule MRI, but was told she may need to wait to schedule, because of Patient's kidney function. Levada Dy stated Patient has scheduled her MRI, because she would like it done as soon as possible. Patient is scheduled MRI 09/27/19 w/ out contrast, at [facility].   Patient is scheduled to see her nephrologist Dr Tresa Moore 09/26/19, at 0900, for her yearly kidney follow up,at [facility]. Levada Dy wanted Dr Vaughan Browner to know about Patient's upcoming Dr Tresa Moore appointment in case he would like to update Dr Tresa Moore on Patient.  Message routed to Dr Vaughan Browner as Juluis Rainier

## 2019-09-25 NOTE — Telephone Encounter (Signed)
Spoke with daughter and advised that I am unable to e-mail the work note to her.  She stated that her work said she does not need it and not to worry about it.  Nothing further needed.

## 2019-09-27 ENCOUNTER — Inpatient Hospital Stay: Admission: RE | Admit: 2019-09-27 | Payer: Medicare Other | Source: Ambulatory Visit

## 2019-09-27 ENCOUNTER — Other Ambulatory Visit: Payer: Self-pay

## 2019-09-27 ENCOUNTER — Ambulatory Visit
Admission: RE | Admit: 2019-09-27 | Discharge: 2019-09-27 | Disposition: A | Discharge status: Home / Self Care | Payer: Medicare Other | Source: Ambulatory Visit | Attending: Pulmonary Disease | Admitting: Pulmonary Disease

## 2019-09-27 ENCOUNTER — Telehealth: Payer: Self-pay | Admitting: Pulmonary Disease

## 2019-09-27 DIAGNOSIS — K769 Liver disease, unspecified: Secondary | ICD-10-CM

## 2019-09-27 MED ORDER — GADOBENATE DIMEGLUMINE 529 MG/ML IV SOLN
14.0000 mL | Freq: Once | INTRAVENOUS | Status: AC | PRN
  Administered 2019-09-27: 14 mL via INTRAVENOUS

## 2019-09-27 NOTE — Telephone Encounter (Signed)
I called [employer] to verify if anything needed since changed to MRI with/without contrast.  Was on the phone over an hour.  They had to start a new case to get authorized.  I was told info met criteria but due to provider info case put in review.  Decision to be made by 9/10.

## 2019-09-27 NOTE — Telephone Encounter (Signed)
Spoke with daughter (dpr on file), states that MRI needs to be both with and without contrast per urologist. Spoke with Dr. Vaughan Browner who advised that order was supposed to be with and without contrast anyways.  New stat order placed.   Called [facility] to make aware that MRI needs to be with and without contrast.   Routing to [facility] as stat message d/t time constraint to see if anything additional needs to be done by our office for this exam.

## 2019-09-27 NOTE — Telephone Encounter (Signed)
Levada Dy daughter states patient has MRI appointment at 2:50 pm. Needs to be changed to with and without contrast. [place] phone number is [phone]. Levada Dy phone number is [phone].

## 2019-10-02 ENCOUNTER — Telehealth: Payer: Self-pay | Admitting: Pulmonary Disease

## 2019-10-02 NOTE — Telephone Encounter (Signed)
Called and spoke with Jocelyn Moss per DPR to let her know results of MRI per Dr. Vaughan Browner and that at this time she did not need to have any lab work done. Advised her to keep 6 month follow up unless she needed to be seen sooner. Daughter expressed understanding. Nothing further needed at this time.

## 2019-10-05 ENCOUNTER — Other Ambulatory Visit: Payer: Medicare Other

## 2019-10-09 NOTE — Telephone Encounter (Signed)
Pt has completed this mri no changes were made to Jocelyn Moss

## 2019-10-11 ENCOUNTER — Other Ambulatory Visit: Payer: Self-pay

## 2019-10-11 ENCOUNTER — Ambulatory Visit: Payer: Medicare Other | Admitting: Family Medicine

## 2019-10-11 VITALS — BP 120/68 | HR 69 | Temp 98.2°F | Resp 18 | Wt 140.0 lb

## 2019-10-11 DIAGNOSIS — J9819 Other pulmonary collapse: Secondary | ICD-10-CM

## 2019-10-11 DIAGNOSIS — Z8673 Personal history of transient ischemic attack (TIA), and cerebral infarction without residual deficits: Secondary | ICD-10-CM

## 2019-10-11 DIAGNOSIS — C649 Malignant neoplasm of unspecified kidney, except renal pelvis: Secondary | ICD-10-CM

## 2019-10-11 DIAGNOSIS — Z23 Encounter for immunization: Secondary | ICD-10-CM

## 2019-10-11 DIAGNOSIS — N289 Disorder of kidney and ureter, unspecified: Secondary | ICD-10-CM

## 2019-10-11 DIAGNOSIS — K838 Other specified diseases of biliary tract: Secondary | ICD-10-CM

## 2019-10-11 DIAGNOSIS — N179 Acute kidney failure, unspecified: Secondary | ICD-10-CM

## 2019-10-11 NOTE — Progress Notes (Signed)
Subjective:    Patient ID: Jocelyn Moss, female    DOB: [birth year], 84 y.o.   MRN: [id number]  HPI Past medical history is significant for a remote lacunar infarct that was discovered coincidentally during a work-up for memory loss.  For this, the patient is on Plavix for secondary prevention of stroke and is also on Lipitor for management and secondary prevention of stroke.  Of note, last year on her physical exam her creatinine was elevated slightly.  Follow-up renal ultrasound revealed a renal mass that turned out to be renal cell carcinoma.  She underwent a partial nephrectomy for this and has done well postoperatively.    Patient also has been suffering from a chronic cough.  Work-up revealed bronchiectasis and right middle lobe syndrome.  She has been seeing pulmonology for this and has been doing much better under the care of her pulmonologist particular using incentive spirometry and flutter valve.  She denies any cough or fever or chills.  Recent CT scan was obtained to evaluate for progression.  Of note, the lung appeared much better however there was a coincidental finding of intrahepatic biliary ductal dilatation and possibly a 3 cm left lobe hepatic mass.  Therefore, MRI of the liver was obtained to evaluate further.  Fortunately on the MRI, there is no definitive mass in the area seen on the CT scan.  There was intrahepatic ductal dilatation.  However these are relatively unchanged from 2019, 5 mm versus 6 mm respectively.  Therefore this appears to be a stable chronic process.  However on her lab work, she was found to have an elevated creatinine of 1.76.  Since that time she has been trying to drink more water. Immunization History  Administered Date(s) Administered  . Fluad Quad(high Dose 65+) 10/19/2018, 10/11/2019  . Influenza Whole 10/30/2008, 10/26/2011  . Influenza, High Dose Seasonal PF 11/02/2017  . Influenza,inj,Quad PF,6+ Mos 10/12/2012, 10/23/2013, 11/12/2014,  10/28/2015, 10/22/2016  . PFIZER SARS-COV-2 Vaccination 02/14/2018, 03/08/2019  . Pneumococcal Conjugate-13 01/01/2013  . Pneumococcal Polysaccharide-23 03/01/2005, 01/26/2007  . Tdap 12/17/2009  . Zoster 03/14/2006  . Zoster Recombinat (Shingrix) 02/10/2017, 05/12/2017    Past Medical History:  Diagnosis Date  . Atrial fibrillation ([place])   . Breast cancer ([place]) 1990  . Chest pain   . Colon cancer ([place])    ileostomy 2000  . Dysrhythmia   . History of colon cancer   . Hypercholesterolemia   . Hypothyroidism   . Multiple lacunar infarcts ([place])   . MVP (mitral valve prolapse)   . Osteoporosis    osteopenia  . Personal history of chemotherapy    6 months  . Personal history of radiation therapy    6 months  . Renal cell carcinoma (H[facility])    stage 1 s/p resection Dr. Tresa Moore 2020  . SOB (shortness of breath)    Past Surgical History:  Procedure Laterality Date  . BREAST BIOPSY    . BREAST LUMPECTOMY Left   . COLON SURGERY  2000   ileostomy  . ILEOSTOMY    . ROBOTIC ASSITED PARTIAL NEPHRECTOMY Right 02/01/2018   Procedure: XI ROBOTIC ASSISTED RETROPERITONEAL PARTIAL NEPHRECTOMY;  Surgeon: Alexis Frock, MD;  Location: [facility];  Service: Urology;  Laterality: Right;   Current Outpatient Medications on File Prior to Visit  Medication Sig Dispense Refill  . atorvastatin (LIPITOR) 20 MG tablet Take 1 tablet (20 mg total) by mouth daily. 90 tablet 2  . clopidogrel (PLAVIX) 75 MG tablet Take 1 tablet (75  mg total) by mouth daily. 90 tablet 3  . levothyroxine (SYNTHROID) 100 MCG tablet TAKE 1 TABLET BY MOUTH EVERY OTHER DAY. 45 tablet 3  . levothyroxine (SYNTHROID) 112 MCG tablet TAKE 1 TABLET BY MOUTH EVERY OTHER DAY. 45 tablet 0  . mometasone-formoterol (DULERA) 100-5 MCG/ACT AERO Inhale 2 puffs into the lungs 2 (two) times daily. 39 g 3  . Multiple Vitamins-Minerals (WOMENS BONE HEALTH PO) Take by mouth.    . pantoprazole (PROTONIX) 40 MG tablet Take 1 tablet (40 mg total) by mouth  daily. 90 tablet 3  . Respiratory Therapy Supplies (FLUTTER) DEVI 1 Device by Does not apply route as directed. 1 each 0  . UNABLE TO FIND Med Name: Mito Q    . metoprolol tartrate (LOPRESSOR) 25 MG tablet Take 0.5 tablets (12.5 mg total) by mouth 2 (two) times daily as needed (palpitations). (Patient not taking: Reported on 10/11/2019) 180 tablet 3   No current facility-administered medications on file prior to visit.   Allergies  Allergen Reactions  . Crestor [Rosuvastatin Calcium] Other (See Comments)    Joint pain, effected liver function   . Rosuvastatin Other (See Comments)    Joint pain, effected liver function  . Zetia [Ezetimibe] Other (See Comments)    Joint pain, effected liver function    Social History   Socioeconomic History  . Marital status: Married    Spouse name: Not on file  . Number of children: Not on file  . Years of education: Not on file  . Highest education level: Not on file  Occupational History  . Not on file  Tobacco Use  . Smoking status: Never Smoker  . Smokeless tobacco: Never Used  Vaping Use  . Vaping Use: Never used  Substance and Sexual Activity  . Alcohol use: No  . Drug use: No  . Sexual activity: Not on file  Other Topics Concern  . Not on file  Social History Narrative  . Not on file   Social Determinants of Health   Financial Resource Strain:   . Difficulty of Paying Living Expenses: Not on file  Food Insecurity:   . Worried About Charity fundraiser in the Last Year: Not on file  . Ran Out of Food in the Last Year: Not on file  Transportation Needs:   . Lack of Transportation (Medical): Not on file  . Lack of Transportation (Non-Medical): Not on file  Physical Activity:   . Days of Exercise per Week: Not on file  . Minutes of Exercise per Session: Not on file  Stress:   . Feeling of Stress : Not on file  Social Connections:   . Frequency of Communication with Friends and Family: Not on file  . Frequency of Social  Gatherings with Friends and Family: Not on file  . Attends Religious Services: Not on file  . Active Member of Clubs or Organizations: Not on file  . Attends Archivist Meetings: Not on file  . Marital Status: Not on file  Intimate Partner Violence:   . Fear of Current or Ex-Partner: Not on file  . Emotionally Abused: Not on file  . Physically Abused: Not on file  . Sexually Abused: Not on file      Review of Systems  All other systems reviewed and are negative.      Objective:   Physical Exam Vitals reviewed.  Constitutional:      Appearance: She is well-developed.  Neck:     Thyroid:  No thyromegaly.     Vascular: No JVD.  Cardiovascular:     Rate and Rhythm: Normal rate. Rhythm irregular.     Heart sounds: Normal heart sounds. No murmur heard.  No friction rub. No gallop.   Pulmonary:     Effort: Pulmonary effort is normal. No respiratory distress.     Breath sounds: Normal breath sounds. No stridor. No wheezing or rales.  Abdominal:     General: Bowel sounds are normal. There is no distension.     Palpations: Abdomen is soft. There is no mass.     Tenderness: There is no abdominal tenderness. There is no guarding or rebound.     Hernia: No hernia is present.  Neurological:     Mental Status: She is alert and oriented to person, place, and time.     Cranial Nerves: No cranial nerve deficit.     Motor: No abnormal muscle tone.     Coordination: Coordination normal.     Deep Tendon Reflexes: Reflexes normal.           Assessment & Plan:  Need for immunization against influenza - Plan: Flu Vaccine QUAD High Dose(Fluad)  Renal insufficiency - Plan: BASIC METABOLIC PANEL WITH GFR  Right middle lobe syndrome  History of CVA (cerebrovascular accident)  Renal cell carcinoma, unspecified laterality (HCC)  AKI (acute kidney injury) ([place])  Dilated intrahepatic bile duct  Patient received her flu shot today.  Also recommended she receive her third  dose of her Covid vaccination within a month.  I believe her renal insufficiency is due to dehydration and prerenal azotemia.  Over the last 2 weeks she has been trying to drink more water and therefore I will repeat a BMP today to monitor this.  If better, no further work-up is necessary.  Clinically her right middle lobe syndrome has improved.  She is doing well on the flutter valve and the incentive spirometry.  I appreciate pulmonology's assistance.  Mass seen on the CT scan and the liver was found to be an error on the MRI.  However there is ductal dilatation.  Thankfully this appears to be chronic comparing MRI from 2019 to MRI at the present time.  Therefore I do not believe that there is an underlying obstructive mass.  Furthermore she does not have any signs or symptoms of PBC or PSC.  We discussed having her see gastroenterology to discuss an ERCP however given her age and the chronicity I do not feel this is necessary.  Patient and her daughter agree.

## 2019-10-12 ENCOUNTER — Other Ambulatory Visit: Payer: Medicare Other

## 2019-10-12 ENCOUNTER — Telehealth: Payer: Self-pay

## 2019-10-12 ENCOUNTER — Other Ambulatory Visit: Payer: Self-pay

## 2019-10-12 DIAGNOSIS — N289 Disorder of kidney and ureter, unspecified: Secondary | ICD-10-CM

## 2019-10-12 DIAGNOSIS — N1330 Unspecified hydronephrosis: Secondary | ICD-10-CM

## 2019-10-12 LAB — URINALYSIS, ROUTINE W REFLEX MICROSCOPIC
Bilirubin Urine: NEGATIVE
Glucose, UA: NEGATIVE
Hgb urine dipstick: NEGATIVE
Ketones, ur: NEGATIVE
Nitrite: NEGATIVE
Protein, ur: NEGATIVE
Specific Gravity, Urine: 1.02 (ref 1.001–1.03)
pH: 5.5 (ref 5.0–8.0)

## 2019-10-12 LAB — BASIC METABOLIC PANEL WITH GFR
BUN/Creatinine Ratio: 24 (calc) — ABNORMAL HIGH (ref 6–22)
BUN: 47 mg/dL — ABNORMAL HIGH (ref 7–25)
CO2: 25 mmol/L (ref 20–32)
Calcium: 9.4 mg/dL (ref 8.6–10.4)
Chloride: 102 mmol/L (ref 98–110)
Creat: 1.95 mg/dL — ABNORMAL HIGH (ref 0.60–0.88)
GFR, Est African American: 27 mL/min/{1.73_m2} — ABNORMAL LOW (ref >=60)
GFR, Est Non African American: 23 mL/min/{1.73_m2} — ABNORMAL LOW (ref >=60)
Glucose, Bld: 120 mg/dL — ABNORMAL HIGH (ref 65–99)
Potassium: 4 mmol/L (ref 3.5–5.3)
Sodium: 137 mmol/L (ref 135–146)

## 2019-10-12 LAB — MICROSCOPIC MESSAGE

## 2019-10-12 NOTE — Telephone Encounter (Signed)
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 Daughter states that she read an article stating that Mito Q can raise the creatnine levels while on it and wants to know if this can be why her creatnine levels are elevated.

## 2019-10-12 NOTE — Progress Notes (Unsigned)
ua

## 2019-10-14 LAB — URINE CULTURE
MICRO NUMBER:: 10964598
SPECIMEN QUALITY:: ADEQUATE

## 2019-10-15 ENCOUNTER — Other Ambulatory Visit: Payer: Self-pay

## 2019-10-15 LAB — PROTEIN ELECTROPHORESIS, SERUM
Albumin ELP: 3.9 g/dL (ref 3.8–4.8)
Alpha 1: 0.2 g/dL (ref 0.2–0.3)
Alpha 2: 0.7 g/dL (ref 0.5–0.9)
Beta 2: 0.4 g/dL (ref 0.2–0.5)
Beta Globulin: 0.5 g/dL (ref 0.4–0.6)
Gamma Globulin: 1.4 g/dL (ref 0.8–1.7)
Total Protein: 7.1 g/dL (ref 6.1–8.1)

## 2019-10-15 MED ORDER — CEPHALEXIN 500 MG PO CAPS: 500.0000 mg | ORAL_CAPSULE | Freq: Four times a day (QID) | ORAL | 0 refills | Status: DC

## 2019-10-15 NOTE — Telephone Encounter (Signed)
Pt notified Verbalizes understanding 

## 2019-10-15 NOTE — Telephone Encounter (Signed)
I am not aware of it causing this but I see no risk in stopping the mito q and rechecking bmp in 2 weeks to see if it improves especially while we get the other tests.

## 2019-10-16 ENCOUNTER — Other Ambulatory Visit: Payer: Self-pay

## 2019-10-22 ENCOUNTER — Other Ambulatory Visit: Payer: Self-pay

## 2019-10-22 ENCOUNTER — Ambulatory Visit (HOSPITAL_COMMUNITY)
Admission: RE | Admit: 2019-10-22 | Discharge: 2019-10-22 | Disposition: A | Discharge status: Home / Self Care | Payer: Medicare Other | Source: Ambulatory Visit | Attending: Family Medicine | Admitting: Family Medicine

## 2019-10-22 DIAGNOSIS — N1330 Unspecified hydronephrosis: Secondary | ICD-10-CM | POA: Insufficient documentation

## 2019-10-29 ENCOUNTER — Other Ambulatory Visit: Payer: Self-pay

## 2019-10-29 ENCOUNTER — Other Ambulatory Visit: Payer: Medicare Other

## 2019-10-29 DIAGNOSIS — N289 Disorder of kidney and ureter, unspecified: Secondary | ICD-10-CM

## 2019-10-30 LAB — BASIC METABOLIC PANEL
BUN/Creatinine Ratio: 21 (calc) (ref 6–22)
BUN: 38 mg/dL — ABNORMAL HIGH (ref 7–25)
CO2: 28 mmol/L (ref 20–32)
Calcium: 9.4 mg/dL (ref 8.6–10.4)
Chloride: 99 mmol/L (ref 98–110)
Creat: 1.83 mg/dL — ABNORMAL HIGH (ref 0.60–0.88)
Glucose, Bld: 117 mg/dL — ABNORMAL HIGH (ref 65–99)
Potassium: 3.4 mmol/L — ABNORMAL LOW (ref 3.5–5.3)
Sodium: 136 mmol/L (ref 135–146)

## 2019-12-28 ENCOUNTER — Other Ambulatory Visit: Payer: Medicare Other

## 2019-12-28 ENCOUNTER — Other Ambulatory Visit: Payer: Self-pay

## 2019-12-28 DIAGNOSIS — N184 Chronic kidney disease, stage 4 (severe): Secondary | ICD-10-CM

## 2019-12-28 DIAGNOSIS — N289 Disorder of kidney and ureter, unspecified: Secondary | ICD-10-CM

## 2019-12-28 DIAGNOSIS — C649 Malignant neoplasm of unspecified kidney, except renal pelvis: Secondary | ICD-10-CM

## 2019-12-29 LAB — BASIC METABOLIC PANEL
BUN/Creatinine Ratio: 20 (calc) (ref 6–22)
BUN: 33 mg/dL — ABNORMAL HIGH (ref 7–25)
CO2: 25 mmol/L (ref 20–32)
Calcium: 9.4 mg/dL (ref 8.6–10.4)
Chloride: 105 mmol/L (ref 98–110)
Creat: 1.64 mg/dL — ABNORMAL HIGH (ref 0.60–0.88)
Glucose, Bld: 91 mg/dL (ref 65–99)
Potassium: 4.1 mmol/L (ref 3.5–5.3)
Sodium: 141 mmol/L (ref 135–146)

## 2019-12-31 ENCOUNTER — Telehealth: Payer: Self-pay | Admitting: Family Medicine

## 2019-12-31 NOTE — Telephone Encounter (Signed)
Daughter call for lab results [phone]

## 2020-01-01 ENCOUNTER — Ambulatory Visit: Payer: Medicare Other | Admitting: Family Medicine

## 2020-01-01 ENCOUNTER — Other Ambulatory Visit: Payer: Self-pay

## 2020-01-01 VITALS — BP 130/70 | HR 67 | Temp 97.4°F | Ht 66.0 in | Wt 148.0 lb

## 2020-01-01 DIAGNOSIS — N1832 Chronic kidney disease, stage 3b: Secondary | ICD-10-CM | POA: Diagnosis not present

## 2020-01-01 DIAGNOSIS — Z8673 Personal history of transient ischemic attack (TIA), and cerebral infarction without residual deficits: Secondary | ICD-10-CM

## 2020-01-01 DIAGNOSIS — J9819 Other pulmonary collapse: Secondary | ICD-10-CM

## 2020-01-01 DIAGNOSIS — N289 Disorder of kidney and ureter, unspecified: Secondary | ICD-10-CM | POA: Diagnosis not present

## 2020-01-01 MED ORDER — PROMETHAZINE HCL 25 MG PO TABS: 25.0000 mg | ORAL_TABLET | Freq: Three times a day (TID) | ORAL | 0 refills | Status: DC | PRN

## 2020-01-01 NOTE — Progress Notes (Signed)
Subjective:    Patient ID: Jocelyn Moss, female    DOB: Nov 13, 1935, 84 y.o.   MRN: [id number]  HPI Past medical history is significant for a remote lacunar infarct that was discovered coincidentally during a work-up for memory loss.  For this, the patient is on Plavix for secondary prevention of stroke and is also on Lipitor for management and secondary prevention of stroke.  Of note, last year on her physical exam her creatinine was elevated slightly.  Follow-up renal ultrasound revealed a renal mass that turned out to be renal cell carcinoma.  She underwent a partial nephrectomy for this and has done well postoperatively.    Patient also has been suffering from a chronic cough.  Work-up revealed bronchiectasis and right middle lobe syndrome.  She has been seeing pulmonology for this and has been doing much better under the care of her pulmonologist particular using incentive spirometry and flutter valve.  She denies any cough or fever or chills.  Recent CT scan was obtained to evaluate for progression.  Of note, the lung appeared much better however there was a coincidental finding of intrahepatic biliary ductal dilatation and possibly a 3 cm left lobe hepatic mass.  Therefore, MRI of the liver was obtained to evaluate further.  Fortunately on the MRI, there is no definitive mass in the area seen on the CT scan.  There was intrahepatic ductal dilatation.  However these are relatively unchanged from 2019, 5 mm versus 6 mm respectively.  Therefore this appears to be a stable chronic process.  However on her lab work, she was found to have an elevated creatinine of 1.76.  Since that time she has been trying to drink more water.  01/01/20 Here today for follow up.  Creatinine has improved slightly.  Patient's daughter attributes this to discontinuing an eye supplement that she was taking for macular degeneration.  She also may be less dehydrated as she has not been out in the heat and sweating is much.   She denies any chest pain shortness of breath or dyspnea on exertion.  She denies any myalgias or right upper quadrant pain.  She denies any bleeding or bruising. Lab on 12/28/2019  Component Date Value Ref Range Status  . Glucose, Bld 12/28/2019 91  65 - 99 mg/dL Final   Comment: .            Fasting reference interval .   . BUN 12/28/2019 33* 7 - 25 mg/dL Final  . Creat 12/28/2019 1.64* 0.60 - 0.88 mg/dL Final   Comment: For patients >21 years of age, the reference limit for Creatinine is approximately 13% higher for people identified as African-American. .   Havery Moros Ratio 12/28/2019 20  6 - 22 (calc) Final  . Sodium 12/28/2019 141  135 - 146 mmol/L Final  . Potassium 12/28/2019 4.1  3.5 - 5.3 mmol/L Final  . Chloride 12/28/2019 105  98 - 110 mmol/L Final  . CO2 12/28/2019 25  20 - 32 mmol/L Final  . Calcium 12/28/2019 9.4  8.6 - 10.4 mg/dL Final    Past Medical History:  Diagnosis Date  . Atrial fibrillation ([place])   . Breast cancer ([place]) 1990  . Chest pain   . Colon cancer ([place])    ileostomy 2000  . Dysrhythmia   . History of colon cancer   . Hypercholesterolemia   . Hypothyroidism   . Multiple lacunar infarcts ([place])   . MVP (mitral valve prolapse)   . Osteoporosis  osteopenia  . Personal history of chemotherapy    6 months  . Personal history of radiation therapy    6 months  . Renal cell carcinoma ([facility])    stage 1 s/p resection Dr. Tresa Moore 2020  . SOB (shortness of breath)    Past Surgical History:  Procedure Laterality Date  . BREAST BIOPSY    . BREAST LUMPECTOMY Left   . COLON SURGERY  2000   ileostomy  . ILEOSTOMY    . ROBOTIC ASSITED PARTIAL NEPHRECTOMY Right 02/01/2018   Procedure: XI ROBOTIC ASSISTED RETROPERITONEAL PARTIAL NEPHRECTOMY;  Surgeon: Alexis Frock, MD;  Location: [facility];  Service: Urology;  Laterality: Right;   Current Outpatient Medications on File Prior to Visit  Medication Sig Dispense Refill  . atorvastatin (LIPITOR) 20 MG  tablet Take 1 tablet (20 mg total) by mouth daily. 90 tablet 2  . cephALEXin (KEFLEX) 500 MG capsule Take 500 mg by mouth in the morning, at noon, and at bedtime.    . clopidogrel (PLAVIX) 75 MG tablet Take 1 tablet (75 mg total) by mouth daily. 90 tablet 3  . levothyroxine (SYNTHROID) 100 MCG tablet TAKE 1 TABLET BY MOUTH EVERY OTHER DAY. 45 tablet 3  . levothyroxine (SYNTHROID) 112 MCG tablet TAKE 1 TABLET BY MOUTH EVERY OTHER DAY. 45 tablet 0  . metoprolol tartrate (LOPRESSOR) 25 MG tablet Take 0.5 tablets (12.5 mg total) by mouth 2 (two) times daily as needed (palpitations). (Patient not taking: Reported on 10/11/2019) 180 tablet 3  . mometasone-formoterol (DULERA) 100-5 MCG/ACT AERO Inhale 2 puffs into the lungs 2 (two) times daily. 39 g 3  . Multiple Vitamins-Minerals (WOMENS BONE HEALTH PO) Take by mouth.    . pantoprazole (PROTONIX) 40 MG tablet Take 1 tablet (40 mg total) by mouth daily. 90 tablet 3  . Respiratory Therapy Supplies (FLUTTER) DEVI 1 Device by Does not apply route as directed. 1 each 0  . UNABLE TO FIND Med Name: Mito Q     No current facility-administered medications on file prior to visit.   Allergies  Allergen Reactions  . Crestor [Rosuvastatin Calcium] Other (See Comments)    Joint pain, effected liver function   . Rosuvastatin Other (See Comments)    Joint pain, effected liver function  . Zetia [Ezetimibe] Other (See Comments)    Joint pain, effected liver function    Social History   Socioeconomic History  . Marital status: Married    Spouse name: Not on file  . Number of children: Not on file  . Years of education: Not on file  . Highest education level: Not on file  Occupational History  . Not on file  Tobacco Use  . Smoking status: Never Smoker  . Smokeless tobacco: Never Used  Vaping Use  . Vaping Use: Never used  Substance and Sexual Activity  . Alcohol use: No  . Drug use: No  . Sexual activity: Not on file  Other Topics Concern  . Not on  file  Social History Narrative  . Not on file   Social Determinants of Health   Financial Resource Strain:   . Difficulty of Paying Living Expenses: Not on file  Food Insecurity:   . Worried About Charity fundraiser in the Last Year: Not on file  . Ran Out of Food in the Last Year: Not on file  Transportation Needs:   . Lack of Transportation (Medical): Not on file  . Lack of Transportation (Non-Medical): Not on file  Physical  Activity:   . Days of Exercise per Week: Not on file  . Minutes of Exercise per Session: Not on file  Stress:   . Feeling of Stress : Not on file  Social Connections:   . Frequency of Communication with Friends and Family: Not on file  . Frequency of Social Gatherings with Friends and Family: Not on file  . Attends Religious Services: Not on file  . Active Member of Clubs or Organizations: Not on file  . Attends Archivist Meetings: Not on file  . Marital Status: Not on file  Intimate Partner Violence:   . Fear of Current or Ex-Partner: Not on file  . Emotionally Abused: Not on file  . Physically Abused: Not on file  . Sexually Abused: Not on file      Review of Systems  All other systems reviewed and are negative.      Objective:   Physical Exam Vitals reviewed.  Constitutional:      Appearance: She is well-developed.  Neck:     Thyroid: No thyromegaly.     Vascular: No JVD.  Cardiovascular:     Rate and Rhythm: Normal rate. Rhythm irregular.     Heart sounds: Normal heart sounds. No murmur heard.  No friction rub. No gallop.   Pulmonary:     Effort: Pulmonary effort is normal. No respiratory distress.     Breath sounds: Normal breath sounds. No stridor. No wheezing or rales.  Abdominal:     General: Bowel sounds are normal. There is no distension.     Palpations: Abdomen is soft. There is no mass.     Tenderness: There is no abdominal tenderness. There is no guarding or rebound.     Hernia: No hernia is present.    Neurological:     Mental Status: She is alert and oriented to person, place, and time.     Cranial Nerves: No cranial nerve deficit.     Motor: No abnormal muscle tone.     Coordination: Coordination normal.     Deep Tendon Reflexes: Reflexes normal.           Assessment & Plan:  Renal insufficiency - Plan: Microalbumin, urine  History of CVA (cerebrovascular accident)  Right middle lobe syndrome  Stage 3b chronic kidney disease ([place]) - Plan: Microalbumin, urine  I will check a urine albumin to creatinine ratio.  If greater than 300, I would recommend starting Farxiga.  If greater than 30 I would recommend starting losartan.  Both of these will be done in an effort to prevent further deterioration in her renal function.  If normal, I would not make any changes at this time and recheck the patient this coming spring.  At that point she would be due for a TSH as well.

## 2020-01-01 NOTE — Telephone Encounter (Signed)
Labs were given at patient appointment

## 2020-01-02 LAB — MICROALBUMIN, URINE: Microalb, Ur: 0.6 mg/dL

## 2020-01-29 ENCOUNTER — Other Ambulatory Visit: Payer: Self-pay | Admitting: Family Medicine

## 2020-01-29 DIAGNOSIS — E038 Other specified hypothyroidism: Secondary | ICD-10-CM

## 2020-02-05 ENCOUNTER — Ambulatory Visit: Payer: Medicare Other | Admitting: Family Medicine

## 2020-02-29 ENCOUNTER — Other Ambulatory Visit: Payer: Medicare Other

## 2020-03-02 ENCOUNTER — Other Ambulatory Visit: Payer: Self-pay | Admitting: Family Medicine

## 2020-05-01 ENCOUNTER — Other Ambulatory Visit: Payer: Self-pay | Admitting: Obstetrics and Gynecology

## 2020-05-01 ENCOUNTER — Telehealth: Payer: Self-pay | Admitting: Family Medicine

## 2020-05-01 ENCOUNTER — Other Ambulatory Visit: Payer: Self-pay | Admitting: *Deleted

## 2020-05-01 DIAGNOSIS — Z1231 Encounter for screening mammogram for malignant neoplasm of breast: Secondary | ICD-10-CM

## 2020-05-01 DIAGNOSIS — E038 Other specified hypothyroidism: Secondary | ICD-10-CM

## 2020-05-01 MED ORDER — LEVOTHYROXINE SODIUM 112 MCG PO TABS: 112.0000 ug | ORAL_TABLET | ORAL | 0 refills | Status: DC

## 2020-05-01 NOTE — Telephone Encounter (Signed)
Patient called back and prefers to schedule mammogram, annual gynecology and bone density tests first. She will call back to schedule her AWV afterwards.

## 2020-05-01 NOTE — Telephone Encounter (Signed)
Tried returning patient's call, but got voice mail agian

## 2020-05-01 NOTE — Telephone Encounter (Signed)
Copied from [place] [id number]. Topic: Medicare AWV >> May 01, 2020  1:18 PM Cher Nakai R wrote: Reason for CRM:  Left message for patient to call back and schedule Medicare Annual Wellness Visit (AWV) in office.   If not able to come in office, please offer to do virtually or by telephone.   Last AWV: 01/30/2019  Please schedule at anytime with [facility]Nurse Health Advisor.  If any questions, please contact me at [phone]

## 2020-05-20 ENCOUNTER — Telehealth: Payer: Self-pay | Admitting: Family Medicine

## 2020-05-20 DIAGNOSIS — E038 Other specified hypothyroidism: Secondary | ICD-10-CM

## 2020-05-20 MED ORDER — LEVOTHYROXINE SODIUM 100 MCG PO TABS: 100.0000 ug | ORAL_TABLET | ORAL | 3 refills | Status: DC

## 2020-05-20 MED ORDER — LEVOTHYROXINE SODIUM 112 MCG PO TABS: 112.0000 ug | ORAL_TABLET | ORAL | 3 refills | Status: DC

## 2020-05-20 NOTE — Telephone Encounter (Signed)
Prescription sent to [facility].

## 2020-05-20 NOTE — Telephone Encounter (Signed)
Patient called to follow up on recent refill request from pharmacy for levothyroxine (SYNTHROID) 112 MCG tablet [[id number]]  Pharmacy:  [id number] - [place], [place]  [address] [place] [address]  Phone:  [phone] Fax:  [phone]  DEA #:  [id number]  Patient only has 1 pill left for tomorrow.   Patient asked why the refill for levothyroxine (SYNTHROID) 100 MCG tablet  Was sent through the mail last time.   Please advise at [phone].

## 2020-05-23 ENCOUNTER — Telehealth: Payer: Self-pay | Admitting: Family Medicine

## 2020-05-23 MED ORDER — PANTOPRAZOLE SODIUM 40 MG PO TBEC
40.0000 mg | DELAYED_RELEASE_TABLET | Freq: Every day | ORAL | 3 refills | Status: DC
Start: 2020-05-23 — End: 2020-09-01

## 2020-05-23 NOTE — Telephone Encounter (Signed)
Prescription sent to pharmacy.

## 2020-05-23 NOTE — Telephone Encounter (Signed)
Pt called in needing a refill of  pantoprazole (PROTONIX) 40 MG tablet  Sent to pharmacy  Cb#: [phone]

## 2020-05-28 ENCOUNTER — Other Ambulatory Visit: Payer: Self-pay | Admitting: Family Medicine

## 2020-06-13 ENCOUNTER — Ambulatory Visit
Admission: RE | Admit: 2020-06-13 | Discharge: 2020-06-13 | Disposition: A | Discharge status: Home / Self Care | Payer: Medicare Other | Source: Ambulatory Visit | Attending: Obstetrics and Gynecology | Admitting: Obstetrics and Gynecology

## 2020-06-13 ENCOUNTER — Other Ambulatory Visit: Payer: Self-pay

## 2020-06-13 DIAGNOSIS — Z1231 Encounter for screening mammogram for malignant neoplasm of breast: Secondary | ICD-10-CM

## 2020-06-24 ENCOUNTER — Other Ambulatory Visit: Payer: Self-pay | Admitting: Family Medicine

## 2020-07-14 ENCOUNTER — Ambulatory Visit: Payer: Medicare Other | Admitting: Family Medicine

## 2020-07-14 ENCOUNTER — Other Ambulatory Visit: Payer: Self-pay

## 2020-07-14 ENCOUNTER — Encounter: Payer: Self-pay | Admitting: Family Medicine

## 2020-07-14 VITALS — BP 128/68 | HR 78 | Temp 97.9°F | Resp 16 | Ht 66.0 in | Wt 144.0 lb

## 2020-07-14 DIAGNOSIS — Z8673 Personal history of transient ischemic attack (TIA), and cerebral infarction without residual deficits: Secondary | ICD-10-CM | POA: Diagnosis not present

## 2020-07-14 DIAGNOSIS — N1832 Chronic kidney disease, stage 3b: Secondary | ICD-10-CM | POA: Diagnosis not present

## 2020-07-14 DIAGNOSIS — R0600 Dyspnea, unspecified: Secondary | ICD-10-CM

## 2020-07-14 DIAGNOSIS — E038 Other specified hypothyroidism: Secondary | ICD-10-CM

## 2020-07-14 DIAGNOSIS — J9819 Other pulmonary collapse: Secondary | ICD-10-CM

## 2020-07-14 MED ORDER — FLUTICASONE PROPIONATE 50 MCG/ACT NA SUSP: 2.0000 | Freq: Every day | NASAL | 6 refills | Status: DC

## 2020-07-14 NOTE — Progress Notes (Signed)
Subjective:    Patient ID: Jocelyn Moss, female    DOB: July 25, 1935, 85 y.o.   MRN: [id number]  HPI Past medical history is significant for a remote lacunar infarct that was discovered coincidentally during a work-up for memory loss.  For this, the patient is on Plavix for secondary prevention of stroke and is also on Lipitor for management and secondary prevention of stroke.  Of note, last year on her physical exam her creatinine was elevated slightly.  Follow-up renal ultrasound revealed a renal mass that turned out to be renal cell carcinoma.  She underwent a partial nephrectomy for this and has done well postoperatively.    Patient also has been suffering from a chronic cough.  Work-up revealed bronchiectasis and right middle lobe syndrome.  She has been seeing pulmonology for this and has been doing much better under the care of her pulmonologist particular using incentive spirometry and flutter valve.  She denies any cough or fever or chills.  Recent CT scan was obtained to evaluate for progression.  Of note, the lung appeared much better however there was a coincidental finding of intrahepatic biliary ductal dilatation and possibly a 3 cm left lobe hepatic mass.  Therefore, MRI of the liver was obtained to evaluate further.  Fortunately on the MRI, there is no definitive mass in the area seen on the CT scan.  There was intrahepatic ductal dilatation.  However these are relatively unchanged from 2019, 5 mm versus 6 mm respectively.  Therefore this appears to be a stable chronic process.  However on her lab work, she was found to have an elevated creatinine of 1.76.  Since that time she has been trying to drink more water.  01/01/20 Here today for follow up.  Creatinine has improved slightly.  Patient's daughter attributes this to discontinuing an eye supplement that she was taking for macular degeneration.  She also may be less dehydrated as she has not been out in the heat and sweating is much.   She denies any chest pain shortness of breath or dyspnea on exertion.  She denies any myalgias or right upper quadrant pain.  She denies any bleeding or bruising.  07/14/20 Patient reports some shortness of breath with activity and easy fatigability.  However the daughter states that she is up at 7 in the morning and is on her feet all day long aside from taking a small break at lunchtime.  She still cleans her home, cooks, prepares her meals, and works vigorously in the garden.  She has not been using the flutter valve consistently.  She does have a tickle cough due to postnasal drip and drainage but she denies any fever or chill or deep congested cough or shortness of breath or pleurisy.  She is consistently taking her Dulera along with her Protonix blood pressure today is well controlled.  She is here primarily to recheck her thyroid along with her renal function.  Past Medical History:  Diagnosis Date   Atrial fibrillation ([place])    Breast cancer ([place]) 1990   Chest pain    Colon cancer ([facility])    ileostomy 2000   Dysrhythmia    History of colon cancer    Hypercholesterolemia    Hypothyroidism    Multiple lacunar infarcts ([facility])    MVP (mitral valve prolapse)    Osteoporosis    osteopenia   Personal history of chemotherapy    6 months   Personal history of radiation therapy    6  months   Renal cell carcinoma ([place])    stage 1 s/p resection Dr. Tresa Moore 2020   SOB (shortness of breath)    Past Surgical History:  Procedure Laterality Date   BREAST BIOPSY     BREAST LUMPECTOMY Left    COLON SURGERY  2000   ileostomy   ILEOSTOMY     ROBOTIC ASSITED PARTIAL NEPHRECTOMY Right 02/01/2018   Procedure: XI ROBOTIC ASSISTED RETROPERITONEAL PARTIAL NEPHRECTOMY;  Surgeon: Alexis Frock, MD;  Location: [facility];  Service: Urology;  Laterality: Right;   Current Outpatient Medications on File Prior to Visit  Medication Sig Dispense Refill   atorvastatin (LIPITOR) 20 MG tablet TAKE 1 TABLET BY MOUTH  EVERY DAY 90 tablet 2   calcium carbonate (OSCAL) 1500 (600 Ca) MG TABS tablet Take by mouth 2 (two) times daily with a meal.     clopidogrel (PLAVIX) 75 MG tablet TAKE 1 TABLET BY MOUTH EVERY DAY 90 tablet 3   DULERA 100-5 MCG/ACT AERO USE 2 INHALATIONS ORALLY   TWICE DAILY 39 g 3   levothyroxine (SYNTHROID) 100 MCG tablet Take 1 tablet (100 mcg total) by mouth every other day. 45 tablet 3   levothyroxine (SYNTHROID) 112 MCG tablet Take 1 tablet (112 mcg total) by mouth every other day. 45 tablet 3   Multiple Vitamins-Minerals (WOMENS BONE HEALTH PO) Take by mouth.     pantoprazole (PROTONIX) 40 MG tablet Take 1 tablet (40 mg total) by mouth daily. 90 tablet 3   Respiratory Therapy Supplies (FLUTTER) DEVI 1 Device by Does not apply route as directed. 1 each 0   metoprolol tartrate (LOPRESSOR) 25 MG tablet Take 0.5 tablets (12.5 mg total) by mouth 2 (two) times daily as needed (palpitations). (Patient not taking: Reported on 07/14/2020) 180 tablet 3   promethazine (PHENERGAN) 25 MG tablet Take 1 tablet (25 mg total) by mouth every 8 (eight) hours as needed for nausea or vomiting. (Patient not taking: Reported on 07/14/2020) 20 tablet 0   No current facility-administered medications on file prior to visit.   Allergies  Allergen Reactions   Crestor [Rosuvastatin Calcium] Other (See Comments)    Joint pain, effected liver function    Rosuvastatin Other (See Comments)    Joint pain, effected liver function   Zetia [Ezetimibe] Other (See Comments)    Joint pain, effected liver function    Social History   Socioeconomic History   Marital status: Married    Spouse name: Not on file   Number of children: Not on file   Years of education: Not on file   Highest education level: Not on file  Occupational History   Not on file  Tobacco Use   Smoking status: Never   Smokeless tobacco: Never  Vaping Use   Vaping Use: Never used  Substance and Sexual Activity   Alcohol use: No   Drug use: No    Sexual activity: Not on file  Other Topics Concern   Not on file  Social History Narrative   Not on file   Social Determinants of Health   Financial Resource Strain: Not on file  Food Insecurity: Not on file  Transportation Needs: Not on file  Physical Activity: Not on file  Stress: Not on file  Social Connections: Not on file  Intimate Partner Violence: Not on file      Review of Systems     Objective:   Physical Exam Vitals reviewed.  Constitutional:      Appearance: Normal appearance. She  is normal weight.  Neck:     Vascular: No carotid bruit.  Cardiovascular:     Rate and Rhythm: Normal rate and regular rhythm.     Heart sounds: Normal heart sounds. No murmur heard.   No friction rub. No gallop.  Pulmonary:     Effort: Pulmonary effort is normal. No respiratory distress.     Breath sounds: Normal breath sounds. No wheezing or rales.  Abdominal:     General: Abdomen is flat. Bowel sounds are normal. There is no distension.     Palpations: Abdomen is soft.     Tenderness: There is no abdominal tenderness.  Musculoskeletal:     Cervical back: Neck supple. No tenderness.     Right lower leg: No edema.     Left lower leg: No edema.  Lymphadenopathy:     Cervical: No cervical adenopathy.  Neurological:     Mental Status: She is alert.          Assessment & Plan:  Other specified hypothyroidism - Plan: TSH  Stage 3b chronic kidney disease ([place]) - Plan: CBC with Differential/Platelet, COMPLETE METABOLIC PANEL WITH GFR, TSH  History of CVA (cerebrovascular accident)  Dyspnea, unspecified type - Plan: Brain natriuretic peptide  Right middle lobe syndrome Physical exam today is normal.  I encouraged the patient to be more consistent using her flutter valve.  I explained the premise of physical therapy to improve her pulmonary function.  Continue using Dulera and Protonix as prescribed.  Also recommended adding Flonase 2 sprays each nostril daily for  possible postnasal drip contributing to some of her cough and upper airway congestion.  Blood pressure today is excellent.  Check a CMP to monitor renal function.  Check a TSH to ensure appropriate dosage of her levothyroxine.  I believe the dyspnea is likely normal decline in stamina related to age however I will check a BNP however her exam today is unremarkable

## 2020-07-15 ENCOUNTER — Other Ambulatory Visit: Payer: Self-pay | Admitting: *Deleted

## 2020-07-15 ENCOUNTER — Telehealth: Payer: Self-pay | Admitting: Family Medicine

## 2020-07-15 DIAGNOSIS — N184 Chronic kidney disease, stage 4 (severe): Secondary | ICD-10-CM

## 2020-07-15 LAB — COMPLETE METABOLIC PANEL WITH GFR
AG Ratio: 1.2 (calc) (ref 1.0–2.5)
ALT: 13 U/L (ref 6–29)
AST: 22 U/L (ref 10–35)
Albumin: 4.1 g/dL (ref 3.6–5.1)
Alkaline phosphatase (APISO): 116 U/L (ref 37–153)
BUN/Creatinine Ratio: 18 (calc) (ref 6–22)
BUN: 42 mg/dL — ABNORMAL HIGH (ref 7–25)
CO2: 27 mmol/L (ref 20–32)
Calcium: 9.2 mg/dL (ref 8.6–10.4)
Chloride: 102 mmol/L (ref 98–110)
Creat: 2.28 mg/dL — ABNORMAL HIGH (ref 0.60–0.88)
GFR, Est African American: 22 mL/min/{1.73_m2} — ABNORMAL LOW (ref >=60)
GFR, Est Non African American: 19 mL/min/{1.73_m2} — ABNORMAL LOW (ref >=60)
Globulin: 3.4 g/dL (calc) (ref 1.9–3.7)
Glucose, Bld: 114 mg/dL — ABNORMAL HIGH (ref 65–99)
Potassium: 3.5 mmol/L (ref 3.5–5.3)
Sodium: 138 mmol/L (ref 135–146)
Total Bilirubin: 0.7 mg/dL (ref 0.2–1.2)
Total Protein: 7.5 g/dL (ref 6.1–8.1)

## 2020-07-15 LAB — TSH: TSH: 2.6 mIU/L (ref 0.40–4.50)

## 2020-07-15 LAB — CBC WITH DIFFERENTIAL/PLATELET
Absolute Monocytes: 472 cells/uL (ref 200–950)
Basophils Absolute: 21 cells/uL (ref 0–200)
Basophils Relative: 0.4 %
Eosinophils Absolute: 143 cells/uL (ref 15–500)
Eosinophils Relative: 2.7 %
HCT: 32.1 % — ABNORMAL LOW (ref 35.0–45.0)
Hemoglobin: 10.6 g/dL — ABNORMAL LOW (ref 11.7–15.5)
Lymphs Abs: 885 cells/uL (ref 850–3900)
MCH: 30.1 pg (ref 27.0–33.0)
MCHC: 33 g/dL (ref 32.0–36.0)
MCV: 91.2 fL (ref 80.0–100.0)
MPV: 9.9 fL (ref 7.5–12.5)
Monocytes Relative: 8.9 %
Neutro Abs: 3779 cells/uL (ref 1500–7800)
Neutrophils Relative %: 71.3 %
Platelets: 160 10*3/uL (ref 140–400)
RBC: 3.52 10*6/uL — ABNORMAL LOW (ref 3.80–5.10)
RDW: 12.8 % (ref 11.0–15.0)
Total Lymphocyte: 16.7 %
WBC: 5.3 10*3/uL (ref 3.8–10.8)

## 2020-07-15 LAB — BRAIN NATRIURETIC PEPTIDE: Brain Natriuretic Peptide: 108 pg/mL — ABNORMAL HIGH (ref <100)

## 2020-07-15 NOTE — Telephone Encounter (Signed)
Received call from patient's daughter Levada Dy to request results of tests taken yesterday. Please advise at [phone]

## 2020-07-15 NOTE — Telephone Encounter (Signed)
Please see results for further clarification.

## 2020-08-31 ENCOUNTER — Other Ambulatory Visit: Payer: Self-pay | Admitting: Family Medicine

## 2020-09-05 ENCOUNTER — Inpatient Hospital Stay (HOSPITAL_COMMUNITY)
Admission: EM | Admit: 2020-09-05 | Discharge: 2020-09-08 | DRG: 309 | Disposition: A | Discharge status: Home / Self Care | Payer: Medicare Other | Attending: Internal Medicine | Admitting: Internal Medicine

## 2020-09-05 ENCOUNTER — Telehealth: Payer: Self-pay

## 2020-09-05 ENCOUNTER — Other Ambulatory Visit: Payer: Self-pay

## 2020-09-05 ENCOUNTER — Encounter (HOSPITAL_COMMUNITY): Payer: Self-pay

## 2020-09-05 ENCOUNTER — Other Ambulatory Visit: Payer: Self-pay | Admitting: Family Medicine

## 2020-09-05 ENCOUNTER — Emergency Department (HOSPITAL_COMMUNITY): Payer: Medicare Other

## 2020-09-05 DIAGNOSIS — R630 Anorexia: Secondary | ICD-10-CM | POA: Diagnosis present

## 2020-09-05 DIAGNOSIS — Z905 Acquired absence of kidney: Secondary | ICD-10-CM

## 2020-09-05 DIAGNOSIS — Z7902 Long term (current) use of antithrombotics/antiplatelets: Secondary | ICD-10-CM

## 2020-09-05 DIAGNOSIS — Z20822 Contact with and (suspected) exposure to covid-19: Secondary | ICD-10-CM | POA: Diagnosis present

## 2020-09-05 DIAGNOSIS — E861 Hypovolemia: Secondary | ICD-10-CM | POA: Diagnosis present

## 2020-09-05 DIAGNOSIS — Z932 Ileostomy status: Secondary | ICD-10-CM

## 2020-09-05 DIAGNOSIS — Z801 Family history of malignant neoplasm of trachea, bronchus and lung: Secondary | ICD-10-CM

## 2020-09-05 DIAGNOSIS — I4891 Unspecified atrial fibrillation: Secondary | ICD-10-CM | POA: Diagnosis not present

## 2020-09-05 DIAGNOSIS — Z6822 Body mass index (BMI) 22.0-22.9, adult: Secondary | ICD-10-CM

## 2020-09-05 DIAGNOSIS — J4520 Mild intermittent asthma, uncomplicated: Secondary | ICD-10-CM | POA: Diagnosis present

## 2020-09-05 DIAGNOSIS — Z803 Family history of malignant neoplasm of breast: Secondary | ICD-10-CM

## 2020-09-05 DIAGNOSIS — E860 Dehydration: Secondary | ICD-10-CM | POA: Diagnosis present

## 2020-09-05 DIAGNOSIS — E876 Hypokalemia: Secondary | ICD-10-CM | POA: Diagnosis present

## 2020-09-05 DIAGNOSIS — N3000 Acute cystitis without hematuria: Secondary | ICD-10-CM | POA: Diagnosis present

## 2020-09-05 DIAGNOSIS — R55 Syncope and collapse: Secondary | ICD-10-CM | POA: Diagnosis present

## 2020-09-05 DIAGNOSIS — E871 Hypo-osmolality and hyponatremia: Secondary | ICD-10-CM | POA: Diagnosis present

## 2020-09-05 DIAGNOSIS — I10 Essential (primary) hypertension: Secondary | ICD-10-CM | POA: Diagnosis present

## 2020-09-05 DIAGNOSIS — I341 Nonrheumatic mitral (valve) prolapse: Secondary | ICD-10-CM | POA: Diagnosis present

## 2020-09-05 DIAGNOSIS — E039 Hypothyroidism, unspecified: Secondary | ICD-10-CM | POA: Diagnosis present

## 2020-09-05 DIAGNOSIS — Z923 Personal history of irradiation: Secondary | ICD-10-CM

## 2020-09-05 DIAGNOSIS — E782 Mixed hyperlipidemia: Secondary | ICD-10-CM | POA: Diagnosis present

## 2020-09-05 DIAGNOSIS — Z9221 Personal history of antineoplastic chemotherapy: Secondary | ICD-10-CM

## 2020-09-05 DIAGNOSIS — Z85528 Personal history of other malignant neoplasm of kidney: Secondary | ICD-10-CM

## 2020-09-05 DIAGNOSIS — K810 Acute cholecystitis: Secondary | ICD-10-CM | POA: Diagnosis not present

## 2020-09-05 DIAGNOSIS — I499 Cardiac arrhythmia, unspecified: Secondary | ICD-10-CM

## 2020-09-05 DIAGNOSIS — Z85038 Personal history of other malignant neoplasm of large intestine: Secondary | ICD-10-CM

## 2020-09-05 DIAGNOSIS — Z888 Allergy status to other drugs, medicaments and biological substances status: Secondary | ICD-10-CM

## 2020-09-05 DIAGNOSIS — Z853 Personal history of malignant neoplasm of breast: Secondary | ICD-10-CM

## 2020-09-05 DIAGNOSIS — I4819 Other persistent atrial fibrillation: Secondary | ICD-10-CM | POA: Diagnosis present

## 2020-09-05 LAB — CBC WITH DIFFERENTIAL/PLATELET
Abs Immature Granulocytes: 0.11 10*3/uL — ABNORMAL HIGH (ref 0.00–0.07)
Basophils Absolute: 0 10*3/uL (ref 0.0–0.1)
Basophils Relative: 0 %
Eosinophils Absolute: 0 10*3/uL (ref 0.0–0.5)
Eosinophils Relative: 0 %
HCT: 26.8 % — ABNORMAL LOW (ref 36.0–46.0)
Hemoglobin: 8.9 g/dL — ABNORMAL LOW (ref 12.0–15.0)
Immature Granulocytes: 1 %
Lymphocytes Relative: 1 %
Lymphs Abs: 0.2 10*3/uL — ABNORMAL LOW (ref 0.7–4.0)
MCH: 30.6 pg (ref 26.0–34.0)
MCHC: 33.2 g/dL (ref 30.0–36.0)
MCV: 92.1 fL (ref 80.0–100.0)
Monocytes Absolute: 0.6 10*3/uL (ref 0.1–1.0)
Monocytes Relative: 4 %
Neutro Abs: 16.7 10*3/uL — ABNORMAL HIGH (ref 1.7–7.7)
Neutrophils Relative %: 94 %
Platelets: 161 10*3/uL (ref 150–400)
RBC: 2.91 MIL/uL — ABNORMAL LOW (ref 3.87–5.11)
RDW: 13.6 % (ref 11.5–15.5)
WBC: 17.6 10*3/uL — ABNORMAL HIGH (ref 4.0–10.5)
nRBC: 0 % (ref 0.0–0.2)

## 2020-09-05 LAB — COMPREHENSIVE METABOLIC PANEL
ALT: 17 U/L (ref 0–44)
AST: 27 U/L (ref 15–41)
Albumin: 3.2 g/dL — ABNORMAL LOW (ref 3.5–5.0)
Alkaline Phosphatase: 95 U/L (ref 38–126)
Anion gap: 11 (ref 5–15)
BUN: 33 mg/dL — ABNORMAL HIGH (ref 8–23)
CO2: 20 mmol/L — ABNORMAL LOW (ref 22–32)
Calcium: 8.7 mg/dL — ABNORMAL LOW (ref 8.9–10.3)
Chloride: 93 mmol/L — ABNORMAL LOW (ref 98–111)
Creatinine, Ser: 2.18 mg/dL — ABNORMAL HIGH (ref 0.44–1.00)
GFR, Estimated: 22 mL/min — ABNORMAL LOW (ref >60)
Glucose, Bld: 130 mg/dL — ABNORMAL HIGH (ref 70–99)
Potassium: 2.8 mmol/L — ABNORMAL LOW (ref 3.5–5.1)
Sodium: 124 mmol/L — ABNORMAL LOW (ref 135–145)
Total Bilirubin: 2 mg/dL — ABNORMAL HIGH (ref 0.3–1.2)
Total Protein: 6.5 g/dL (ref 6.5–8.1)

## 2020-09-05 LAB — MAGNESIUM: Magnesium: 1 mg/dL — ABNORMAL LOW (ref 1.7–2.4)

## 2020-09-05 LAB — RESP PANEL BY RT-PCR (FLU A&B, COVID) ARPGX2
Influenza A by PCR: NEGATIVE
Influenza B by PCR: NEGATIVE
SARS Coronavirus 2 by RT PCR: NEGATIVE

## 2020-09-05 MED ORDER — SULFAMETHOXAZOLE-TRIMETHOPRIM 800-160 MG PO TABS: 1.0000 | ORAL_TABLET | Freq: Two times a day (BID) | ORAL | 0 refills | Status: DC

## 2020-09-05 MED ORDER — MAGNESIUM SULFATE 2 GM/50ML IV SOLN
2.0000 g | Freq: Once | INTRAVENOUS | Status: AC
  Administered 2020-09-05: 2 g via INTRAVENOUS
  Filled 2020-09-05: qty 50

## 2020-09-05 MED ORDER — SODIUM CHLORIDE 0.9 % IV BOLUS
500.0000 mL | Freq: Once | INTRAVENOUS | Status: AC
  Administered 2020-09-05: 500 mL via INTRAVENOUS

## 2020-09-05 MED ORDER — POTASSIUM CHLORIDE CRYS ER 20 MEQ PO TBCR
40.0000 meq | EXTENDED_RELEASE_TABLET | Freq: Once | ORAL | Status: AC
  Administered 2020-09-05: 40 meq via ORAL
  Filled 2020-09-05: qty 2

## 2020-09-05 MED ORDER — METOPROLOL TARTRATE 5 MG/5ML IV SOLN
2.5000 mg | Freq: Once | INTRAVENOUS | Status: AC
  Administered 2020-09-05: 2.5 mg via INTRAVENOUS
  Filled 2020-09-05: qty 5

## 2020-09-05 MED ORDER — POTASSIUM CHLORIDE 10 MEQ/100ML IV SOLN
10.0000 meq | INTRAVENOUS | Status: AC
  Administered 2020-09-05 (×2): 10 meq via INTRAVENOUS
  Filled 2020-09-05 (×2): qty 100

## 2020-09-05 NOTE — ED Notes (Signed)
Pt stated when she woke up this morning she felt dizzy but did not fall.

## 2020-09-05 NOTE — ED Triage Notes (Signed)
CBG 150 RR 16   Family state pt has had poor intake the last few days. She is on Plavix.

## 2020-09-05 NOTE — ED Notes (Signed)
purewick placed on pt.

## 2020-09-05 NOTE — ED Notes (Signed)
Notified provider about pulse being low.

## 2020-09-05 NOTE — ED Triage Notes (Signed)
Pt bib ems from home c/o syncope episode. Pt denies falling. Upon arrival ems noted pt HR 170-200 afib RVR. Pt denies any cardiac hx. Family state pt was dx with irregular HR. Pt received 500 ml NS which changed HR 100 with multiple PVC. When transporting pt rhythm changed to SVT so pt received 6 mg of adenosine. HR switched to 90 with PVC another 500 ml bolus given.  BP 138/50 Room Air 98%

## 2020-09-05 NOTE — Telephone Encounter (Signed)
Call placed to patient daughter, Jocelyn Moss.   Reports that patient states Sx began to day around 2 pm.   Reports that patient voiced c/o loss of appetite, increased belching, pain noted in upper abd, fever (t max 99.9), weakness, fatigue. Paused conversation to discuss with PCP.   Advised to continue to monitor and to use Sx management.   Resumed conversation with patient daughter and was advised that patient is now reporting pain is in LLQ.   PC made aware and states that he will follow up.

## 2020-09-05 NOTE — ED Provider Notes (Signed)
[facility] EMERGENCY DEPARTMENT Provider Note   CSN: [id number] Arrival date & time: 09/05/20  1942     History Chief Complaint  Patient presents with   Near Syncope    Jocelyn Moss is a 85 y.o. female.  She has a history of colon cancer renal cell cancer and has a colostomy.  She said she had a sharp pain in her lower abdomen that caused her to feel dizzy.  She has not been eating or drinking much.  There was a possible syncopal event at home although patient denies she was unconscious.  EMS found her in a narrow complex rhythm of 1 70-200.  She received some IV fluids and her heart rate improved to sinus with multiple PVCs.  EMS thought she might of gone back in SVT so gave her 6 of adenosine.  Currently patient denies any chest pain or palpitations.  Her abdominal pain is also resolved.  She said she probably has not been eating or drinking as well as she should.  She does follow with cardiology although the last note they comment upon some mitral valve prolapse but no documented history of A. fib.  She is on Plavix but no other blood thinners.  The history is provided by the patient.  Near Syncope This is a new problem. The current episode started 1 to 2 hours ago. The problem has been resolved. Associated symptoms include abdominal pain. Pertinent negatives include no chest pain, no headaches and no shortness of breath. Nothing aggravates the symptoms. Nothing relieves the symptoms. She has tried rest for the symptoms. The treatment provided significant relief.      Past Medical History:  Diagnosis Date   Atrial fibrillation ([place])    Breast cancer ([place]) 1990   Chest pain    Colon cancer ([place])    ileostomy 2000   Dysrhythmia    History of colon cancer    Hypercholesterolemia    Hypothyroidism    Multiple lacunar infarcts (HCC)    MVP (mitral valve prolapse)    Osteoporosis    osteopenia   Personal history of chemotherapy    6 months   Personal history  of radiation therapy    6 months   Renal cell carcinoma ([place])    stage 1 s/p resection Dr. Tresa Moore 2020   SOB (shortness of breath)     Patient Active Problem List   Diagnosis Date Noted   Renal cell carcinoma ([place])    Renal mass 02/01/2018   AKI (acute kidney injury) ([place]) 02/06/2016   Orthostasis 02/06/2016   Dehydration 02/06/2016   Diarrhea 02/06/2016   Syncope due to orthostatic hypotension 02/06/2016   PTTD (posterior tibial tendon dysfunction) 10/21/2015   Vitamin D deficiency 09/03/2015   Osteopenia 03/15/2012   MVP (mitral valve prolapse)    Hypercholesterolemia    History of colon cancer    Hypothyroidism    Chest pain    Atrial fibrillation (HCC)    SOB (shortness of breath)    Breast cancer ([place])    COUGH 02/21/2009   PULMONARY COLLAPSE 12/24/2008   SWELLING, MASS, OR LUMP IN CHEST 12/24/2008   HYPOTHYROIDISM 12/23/2008   HYPERLIPIDEMIA 12/23/2008    Past Surgical History:  Procedure Laterality Date   BREAST BIOPSY     BREAST LUMPECTOMY Left    COLON SURGERY  2000   ileostomy   ILEOSTOMY     ROBOTIC ASSITED PARTIAL NEPHRECTOMY Right 02/01/2018   Procedure: XI ROBOTIC ASSISTED RETROPERITONEAL PARTIAL NEPHRECTOMY;  Surgeon: Alexis Frock, MD;  Location: [facility];  Service: Urology;  Laterality: Right;     OB History   No obstetric history on file.     Family History  Problem Relation Age of Onset   Lung cancer Mother    Lung cancer Father    Lung cancer Sister    Breast cancer Sister     Social History   Tobacco Use   Smoking status: Never   Smokeless tobacco: Never  Vaping Use   Vaping Use: Never used  Substance Use Topics   Alcohol use: No   Drug use: No    Home Medications Prior to Admission medications   Medication Sig Start Date End Date Taking? Authorizing Provider  atorvastatin (LIPITOR) 20 MG tablet TAKE 1 TABLET BY MOUTH EVERY DAY 03/03/20   Susy Frizzle, MD  calcium carbonate (OSCAL) 1500 (600 Ca) MG TABS tablet Take by  mouth 2 (two) times daily with a meal.    [provider]  clopidogrel (PLAVIX) 75 MG tablet TAKE 1 TABLET BY MOUTH EVERY DAY 05/29/20   Susy Frizzle, MD  DULERA 100-5 MCG/ACT AERO USE 2 INHALATIONS ORALLY   TWICE DAILY 06/24/20   Susy Frizzle, MD  fluticasone (FLONASE) 50 MCG/ACT nasal spray Place 2 sprays into both nostrils daily. 07/14/20   Susy Frizzle, MD  levothyroxine (SYNTHROID) 100 MCG tablet Take 1 tablet (100 mcg total) by mouth every other day. 05/20/20   Susy Frizzle, MD  levothyroxine (SYNTHROID) 112 MCG tablet Take 1 tablet (112 mcg total) by mouth every other day. 05/20/20   Susy Frizzle, MD  metoprolol tartrate (LOPRESSOR) 25 MG tablet Take 0.5 tablets (12.5 mg total) by mouth 2 (two) times daily as needed (palpitations). Patient not taking: Reported on 07/14/2020 07/04/19   Jettie Booze, MD  Multiple Vitamins-Minerals (WOMENS BONE HEALTH PO) Take by mouth.    [provider]  pantoprazole (PROTONIX) 40 MG tablet TAKE 1 TABLET BY MOUTH EVERY DAY 09/01/20   Susy Frizzle, MD  promethazine (PHENERGAN) 25 MG tablet Take 1 tablet (25 mg total) by mouth every 8 (eight) hours as needed for nausea or vomiting. Patient not taking: Reported on 07/14/2020 01/01/20   Susy Frizzle, MD  Respiratory Therapy Supplies (FLUTTER) DEVI 1 Device by Does not apply route as directed. 06/06/19   Mannam, Hart Robinsons, MD  sulfamethoxazole-trimethoprim (BACTRIM DS) 800-160 MG tablet Take 1 tablet by mouth 2 (two) times daily. 09/05/20   Susy Frizzle, MD    Allergies    Crestor [rosuvastatin calcium], Rosuvastatin, and Zetia [ezetimibe]  Review of Systems   Review of Systems  Constitutional:  Negative for fever.  HENT:  Negative for sore throat.   Eyes:  Negative for visual disturbance.  Respiratory:  Negative for shortness of breath.   Cardiovascular:  Positive for near-syncope. Negative for chest pain.  Gastrointestinal:  Positive for abdominal pain.  Negative for nausea and vomiting.  Genitourinary:  Negative for dysuria.  Musculoskeletal:  Negative for neck pain.  Skin:  Negative for rash.  Neurological:  Negative for headaches.   Physical Exam Updated Vital Signs BP 129/64 (BP Location: Right Arm)   Pulse 71   Temp 98 F (36.7 C) (Oral)   Resp (!) 31   SpO2 98%   Physical Exam Vitals and nursing note reviewed.  Constitutional:      General: She is not in acute distress.    Appearance: Normal appearance. She is well-developed.  HENT:     Head: Normocephalic and atraumatic.  Eyes:     Conjunctiva/sclera: Conjunctivae normal.  Cardiovascular:     Rate and Rhythm: Normal rate and regular rhythm.     Heart sounds: No murmur heard. Pulmonary:     Effort: Pulmonary effort is normal. No respiratory distress.     Breath sounds: Normal breath sounds.  Abdominal:     Palpations: Abdomen is soft.     Tenderness: There is no abdominal tenderness. There is no guarding or rebound.  Musculoskeletal:        General: No deformity or signs of injury. Normal range of motion.     Cervical back: Neck supple.  Skin:    General: Skin is warm and dry.  Neurological:     General: No focal deficit present.     Mental Status: She is alert.    ED Results / Procedures / Treatments   Labs (all labs ordered are listed, but only abnormal results are displayed) Labs Reviewed  COMPREHENSIVE METABOLIC PANEL - Abnormal; Notable for the following components:      Result Value   Sodium 124 (*)    Potassium 2.8 (*)    Chloride 93 (*)    CO2 20 (*)    Glucose, Bld 130 (*)    BUN 33 (*)    Creatinine, Ser 2.18 (*)    Calcium 8.7 (*)    Albumin 3.2 (*)    Total Bilirubin 2.0 (*)    GFR, Estimated 22 (*)    All other components within normal limits  CBC WITH DIFFERENTIAL/PLATELET - Abnormal; Notable for the following components:   WBC 17.6 (*)    RBC 2.91 (*)    Hemoglobin 8.9 (*)    HCT 26.8 (*)    Neutro Abs 16.7 (*)    Lymphs Abs 0.2  (*)    Abs Immature Granulocytes 0.11 (*)    All other components within normal limits  MAGNESIUM - Abnormal; Notable for the following components:   Magnesium 1.0 (*)    All other components within normal limits  URINALYSIS, ROUTINE W REFLEX MICROSCOPIC - Abnormal; Notable for the following components:   Glucose, UA 50 (*)    All other components within normal limits  BASIC METABOLIC PANEL - Abnormal; Notable for the following components:   Sodium 129 (*)    Chloride 97 (*)    CO2 21 (*)    Glucose, Bld 167 (*)    BUN 27 (*)    Creatinine, Ser 1.99 (*)    Calcium 8.8 (*)    GFR, Estimated 24 (*)    All other components within normal limits  CBC WITH DIFFERENTIAL/PLATELET - Abnormal; Notable for the following components:   WBC 20.9 (*)    RBC 3.04 (*)    Hemoglobin 9.3 (*)    HCT 28.7 (*)    Neutro Abs 19.5 (*)    Lymphs Abs 0.3 (*)    Abs Immature Granulocytes 0.14 (*)    All other components within normal limits  OSMOLALITY, URINE - Abnormal; Notable for the following components:   Osmolality, Ur 296 (*)    All other components within normal limits  RESP PANEL BY RT-PCR (FLU A&B, COVID) ARPGX2  MAGNESIUM  SODIUM, URINE, RANDOM  BASIC METABOLIC PANEL  BASIC METABOLIC PANEL  BASIC METABOLIC PANEL  OSMOLALITY    EKG EKG Interpretation  Date/Time:  Friday September 05 2020 19:44:33 EDT Ventricular Rate:  91 PR Interval:  168 QRS Duration:  101 QT Interval:  370 QTC Calculation: 456 R Axis:   72 Text Interpretation: Sinus tachycardia Multiform ventricular premature complexes Borderline repolarization abnormality increased rate and ectopy compared with prior 1/18 Confirmed by Aletta Edouard [phone]) on 09/05/2020 8:18:59 PM Also confirmed by Aletta Edouard [phone]), editor Hattie Perch [id number])  on 09/06/2020 8:09:28 AM  Radiology CT ABDOMEN PELVIS WO CONTRAST  Result Date: 09/06/2020 CLINICAL DATA:  History of renal cell carcinoma and partial right nephrectomy as  well as breast cancer and colon cancer. Presents with syncopal episode with subsequent fall. EXAM: CT ABDOMEN AND PELVIS WITHOUT CONTRAST TECHNIQUE: Multidetector CT imaging of the abdomen and pelvis was performed following the standard protocol without IV contrast. COMPARISON:  Abdomen pelvis CT, dated August 21, 2018, and MR abdomen dated September 27, 2019 FINDINGS: Lower chest: Mild to moderate severity atelectasis is seen within the bilateral lung bases. Mild to moderate severity cardiomegaly is noted. Hepatobiliary: Stable atrophic changes are seen throughout the left lobe of the liver. No focal liver abnormality is seen. Several small gallstones are seen within a mildly distended gallbladder. There is no evidence of gallbladder wall thickening or pericholecystic inflammation. Pancreas: Unremarkable. No pancreatic ductal dilatation or surrounding inflammatory changes. Spleen: Normal in size without focal abnormality. Adrenals/Urinary Tract: Adrenal glands are unremarkable. There is evidence of a partial right nephrectomy. Kidneys are otherwise normal, without renal calculi or focal lesions. Stable prominence of a right extrarenal pelvis is seen. The urinary bladder is mildly distended and otherwise unremarkable. Stomach/Bowel: There is a small hiatal hernia. Appendix appears normal. No evidence of bowel wall thickening, distention, or inflammatory changes. Vascular/Lymphatic: Aortic atherosclerosis. No enlarged abdominal or pelvic lymph nodes. Reproductive: The uterus is not identified. Other: There is a right lower quadrant ostomy site. Surgical clips are seen within the lateral aspect of the mid left abdomen, posterior aspect of the left lower quadrant and posterior to the rectosigmoid junction. A 4.8 cm x 3.0 cm x 4.4 cm calcified mass is seen within the omentum along the anterior aspect of the mid abdomen, to the left of midline. This is very mildly increased in size when compared to the prior studies. No  abdominopelvic ascites. Musculoskeletal: There is no evidence of acute osseous abnormality. Marked severity scoliosis and multilevel degenerative changes are seen throughout the lumbar spine. IMPRESSION: 1. Calcified omental mass, as described above, which is slightly increased in size when compared to the prior studies. This is likely benign in etiology and may represent sequelae associated with an area of fat necrosis. 2. Cholelithiasis without acute cholecystitis. 3. Chronic atrophic changes involving the left lobe of the liver. 4. Stable postoperative changes with subsequent right lower quadrant ostomy site. Electronically Signed   By: Virgina Norfolk M.D.   On: 09/06/2020 03:10   DG Chest Port 1 View  Result Date: 09/05/2020 CLINICAL DATA:  Weakness.  Fever and near syncope. EXAM: PORTABLE CHEST 1 VIEW COMPARISON:  Radiograph 05/18/2019.  CT 09/21/2019 FINDINGS: Patient is rotated. Stable heart size and mediastinal contours. Mild lingular opacity corresponds to bronchiectasis on prior CT. No acute airspace disease. No pulmonary edema. No pneumothorax or large pleural effusion. No acute osseous abnormalities are seen. The bones are under mineralized. IMPRESSION: No acute abnormality. Electronically Signed   By: Keith Rake M.D.   On: 09/05/2020 20:26   US Abdomen Limited RUQ (LIVER/GB)  Result Date: 09/06/2020 CLINICAL DATA:  Mild epigastric pain x1 day EXAM: ULTRASOUND ABDOMEN LIMITED RIGHT UPPER QUADRANT COMPARISON:  None. FINDINGS: Gallbladder: No gallstones,  gallbladder wall thickening, or pericholecystic fluid. Negative sonographic Murphy's sign. Common bile duct: Diameter: 3 mm Liver: No focal lesion identified. Within normal limits in parenchymal echogenicity. Portal vein is patent on color Doppler imaging with normal direction of blood flow towards the liver. Other: None. IMPRESSION: Negative right upper quadrant ultrasound. Electronically Signed   By: Julian Hy M.D.   On:  09/06/2020 03:00    Procedures Procedures   Medications Ordered in ED Medications  0.9 %  sodium chloride infusion ( Intravenous New Bag/Given 09/06/20 0208)  atorvastatin (LIPITOR) tablet 20 mg (20 mg Oral Given 09/06/20 1007)  metoprolol tartrate (LOPRESSOR) tablet 12.5 mg (12.5 mg Oral Given 09/06/20 1007)  levothyroxine (SYNTHROID) tablet 100 mcg (100 mcg Oral Given 09/06/20 0613)  levothyroxine (SYNTHROID) tablet 112 mcg (112 mcg Oral Patient Refused/Not Given 09/06/20 0614)  pantoprazole (PROTONIX) EC tablet 40 mg (40 mg Oral Given 09/06/20 1007)  clopidogrel (PLAVIX) tablet 75 mg (75 mg Oral Given 09/06/20 1007)  mometasone-formoterol (DULERA) 100-5 MCG/ACT inhaler 2 puff (has no administration in time range)  enoxaparin (LOVENOX) injection 30 mg (30 mg Subcutaneous Given 09/06/20 1007)  acetaminophen (TYLENOL) tablet 650 mg (has no administration in time range)    Or  acetaminophen (TYLENOL) suppository 650 mg (has no administration in time range)  polyethylene glycol (MIRALAX / GLYCOLAX) packet 17 g (has no administration in time range)  ondansetron (ZOFRAN) tablet 4 mg (has no administration in time range)    Or  ondansetron (ZOFRAN) injection 4 mg (has no administration in time range)  albuterol (PROVENTIL) (2.5 MG/3ML) 0.083% nebulizer solution 2.5 mg (has no administration in time range)  sodium chloride 0.9 % bolus 500 mL (0 mLs Intravenous Stopped 09/05/20 2124)  metoprolol tartrate (LOPRESSOR) injection 2.5 mg (2.5 mg Intravenous Given 09/05/20 2127)  potassium chloride SA (KLOR-CON) CR tablet 40 mEq (40 mEq Oral Given 09/05/20 2227)  potassium chloride 10 mEq in 100 mL IVPB (0 mEq Intravenous Stopped 09/06/20 0159)  magnesium sulfate IVPB 2 g 50 mL (0 g Intravenous Stopped 09/05/20 2325)  potassium chloride (KLOR-CON) packet 40 mEq (40 mEq Oral Given 09/06/20 0209)  magnesium sulfate IVPB 2 g 50 mL (0 g Intravenous Stopped 09/06/20 0308)    ED Course  I have reviewed the triage  vital signs and the nursing notes.  Pertinent labs & imaging results that were available during my care of the patient were reviewed by me and considered in my medical decision making (see chart for details).  Clinical Course as of 09/06/20 1012  Fri Sep 05, 2020  2033 Patient's daughter is here now and she did say that she did pass out when she was standing.  She states that the patient has been working in the garden during this heat and is worried she may be dehydrated.  She has passed out before when she has been dehydrated. [MB]  2033 I reviewed the EKG and the concern for tacky rhythms with cardiology.  He feels that she may benefit from some fluids and some IV Lopressor and also consideration for IV digoxin if we do not have much blood pressure to work with. [MB]  2034 Chest x-ray does not show any acute infiltrates. [MB]  2202 Discussed with [facility] hospitalist Dr. Cyd Silence who will evaluate the patient for admission. [MB]    Clinical Course User Index [MB] Hayden Rasmussen, MD   MDM Rules/Calculators/A&P  This patient complains of syncope abdominal pain poor p.o. intake; this involves an extensive number of treatment Options and is a complaint that carries with it a high risk of complications and Morbidity. The differential includes arrhythmia, metabolic derangement, intra-abdominal infection, ACS  I ordered, reviewed and interpreted labs, which included CBC with elevated white count, hemoglobin low, chemistries with low sodium low potassium low magnesium, COVID testing negative I ordered medication IV fluids, IV potassium and oral potassium, IV magnesium I ordered imaging studies which included chest x-ray and I independently    visualized and interpreted imaging which showed no acute findings Additional history obtained from patient's daughter Previous records obtained and reviewed in epic, no recent admissions I consulted Dr. Cyd Silence and discussed lab and  imaging findings  Critical Interventions: None  After the interventions stated above, I reevaluated the patient and found patient's heart rhythms to be stabilizing.  Still having a lot of ectopy.  She will need to be admitted to the hospital for further repletion of electrolytes.  Also has elevated white count and transient abdominal pain may need further work-up for this.   Final Clinical Impression(s) / ED Diagnoses Final diagnoses:  Syncope, unspecified syncope type  Hypokalemia  Hypomagnesemia  Cardiac arrhythmia, unspecified cardiac arrhythmia type    Rx / DC Orders ED Discharge Orders     None        Hayden Rasmussen, MD 09/06/20 1021

## 2020-09-06 ENCOUNTER — Observation Stay (HOSPITAL_COMMUNITY): Payer: Medicare Other

## 2020-09-06 ENCOUNTER — Encounter (HOSPITAL_COMMUNITY): Payer: Self-pay | Admitting: Internal Medicine

## 2020-09-06 DIAGNOSIS — N3000 Acute cystitis without hematuria: Secondary | ICD-10-CM

## 2020-09-06 DIAGNOSIS — I4891 Unspecified atrial fibrillation: Secondary | ICD-10-CM

## 2020-09-06 DIAGNOSIS — E782 Mixed hyperlipidemia: Secondary | ICD-10-CM

## 2020-09-06 DIAGNOSIS — R55 Syncope and collapse: Secondary | ICD-10-CM

## 2020-09-06 DIAGNOSIS — E876 Hypokalemia: Secondary | ICD-10-CM

## 2020-09-06 DIAGNOSIS — J4520 Mild intermittent asthma, uncomplicated: Secondary | ICD-10-CM | POA: Diagnosis present

## 2020-09-06 DIAGNOSIS — E871 Hypo-osmolality and hyponatremia: Secondary | ICD-10-CM

## 2020-09-06 DIAGNOSIS — Z932 Ileostomy status: Secondary | ICD-10-CM

## 2020-09-06 DIAGNOSIS — E039 Hypothyroidism, unspecified: Secondary | ICD-10-CM

## 2020-09-06 LAB — URINALYSIS, ROUTINE W REFLEX MICROSCOPIC
Bilirubin Urine: NEGATIVE
Glucose, UA: 50 mg/dL — AB
Hgb urine dipstick: NEGATIVE
Ketones, ur: NEGATIVE mg/dL
Leukocytes,Ua: NEGATIVE
Nitrite: NEGATIVE
Protein, ur: NEGATIVE mg/dL
Specific Gravity, Urine: 1.008 (ref 1.005–1.030)
pH: 6 (ref 5.0–8.0)

## 2020-09-06 LAB — CBC WITH DIFFERENTIAL/PLATELET
Abs Immature Granulocytes: 0.14 10*3/uL — ABNORMAL HIGH (ref 0.00–0.07)
Basophils Absolute: 0 10*3/uL (ref 0.0–0.1)
Basophils Relative: 0 %
Eosinophils Absolute: 0.1 10*3/uL (ref 0.0–0.5)
Eosinophils Relative: 1 %
HCT: 28.7 % — ABNORMAL LOW (ref 36.0–46.0)
Hemoglobin: 9.3 g/dL — ABNORMAL LOW (ref 12.0–15.0)
Immature Granulocytes: 1 %
Lymphocytes Relative: 2 %
Lymphs Abs: 0.3 10*3/uL — ABNORMAL LOW (ref 0.7–4.0)
MCH: 30.6 pg (ref 26.0–34.0)
MCHC: 32.4 g/dL (ref 30.0–36.0)
MCV: 94.4 fL (ref 80.0–100.0)
Monocytes Absolute: 0.7 10*3/uL (ref 0.1–1.0)
Monocytes Relative: 4 %
Neutro Abs: 19.5 10*3/uL — ABNORMAL HIGH (ref 1.7–7.7)
Neutrophils Relative %: 92 %
Platelets: 153 10*3/uL (ref 150–400)
RBC: 3.04 MIL/uL — ABNORMAL LOW (ref 3.87–5.11)
RDW: 13.9 % (ref 11.5–15.5)
WBC: 20.9 10*3/uL — ABNORMAL HIGH (ref 4.0–10.5)
nRBC: 0 % (ref 0.0–0.2)

## 2020-09-06 LAB — BASIC METABOLIC PANEL
Anion gap: 11 (ref 5–15)
Anion gap: 9 (ref 5–15)
BUN: 27 mg/dL — ABNORMAL HIGH (ref 8–23)
BUN: 25 mg/dL — ABNORMAL HIGH (ref 8–23)
CO2: 21 mmol/L — ABNORMAL LOW (ref 22–32)
CO2: 20 mmol/L — ABNORMAL LOW (ref 22–32)
Calcium: 8.8 mg/dL — ABNORMAL LOW (ref 8.9–10.3)
Calcium: 8.3 mg/dL — ABNORMAL LOW (ref 8.9–10.3)
Chloride: 97 mmol/L — ABNORMAL LOW (ref 98–111)
Chloride: 98 mmol/L (ref 98–111)
Creatinine, Ser: 1.99 mg/dL — ABNORMAL HIGH (ref 0.44–1.00)
Creatinine, Ser: 1.64 mg/dL — ABNORMAL HIGH (ref 0.44–1.00)
GFR, Estimated: 24 mL/min — ABNORMAL LOW (ref >60)
GFR, Estimated: 31 mL/min — ABNORMAL LOW (ref >60)
Glucose, Bld: 167 mg/dL — ABNORMAL HIGH (ref 70–99)
Glucose, Bld: 111 mg/dL — ABNORMAL HIGH (ref 70–99)
Potassium: 4.5 mmol/L (ref 3.5–5.1)
Potassium: 4.1 mmol/L (ref 3.5–5.1)
Sodium: 129 mmol/L — ABNORMAL LOW (ref 135–145)
Sodium: 127 mmol/L — ABNORMAL LOW (ref 135–145)

## 2020-09-06 LAB — OSMOLALITY: Osmolality: 276 mOsm/kg (ref 275–295)

## 2020-09-06 LAB — ECHOCARDIOGRAM COMPLETE
Area-P 1/2: 3.13 cm2
Calc EF: 51.8 %
Height: 66 in
S' Lateral: 2.7 cm
Single Plane A2C EF: 50.1 %
Single Plane A4C EF: 53.8 %
Weight: 2272 oz

## 2020-09-06 LAB — MAGNESIUM: Magnesium: 2.2 mg/dL (ref 1.7–2.4)

## 2020-09-06 LAB — SODIUM, URINE, RANDOM: Sodium, Ur: 21 mmol/L

## 2020-09-06 LAB — OSMOLALITY, URINE: Osmolality, Ur: 296 mOsm/kg — ABNORMAL LOW (ref 300–900)

## 2020-09-06 MED ORDER — MAGNESIUM SULFATE 2 GM/50ML IV SOLN
2.0000 g | Freq: Once | INTRAVENOUS | Status: AC
  Administered 2020-09-06: 2 g via INTRAVENOUS
  Filled 2020-09-06: qty 50

## 2020-09-06 MED ORDER — ONDANSETRON HCL 4 MG/2ML IJ SOLN: 4.0000 mg | Freq: Four times a day (QID) | INTRAMUSCULAR | Status: DC | PRN

## 2020-09-06 MED ORDER — ENOXAPARIN SODIUM 30 MG/0.3ML IJ SOSY
30.0000 mg | PREFILLED_SYRINGE | INTRAMUSCULAR | Status: DC
  Administered 2020-09-06 – 2020-09-08 (×3): 30 mg via SUBCUTANEOUS
  Filled 2020-09-06 (×3): qty 0.3

## 2020-09-06 MED ORDER — LEVOTHYROXINE SODIUM 112 MCG PO TABS
112.0000 ug | ORAL_TABLET | ORAL | Status: DC
  Administered 2020-09-08: 112 ug via ORAL
  Filled 2020-09-06 (×3): qty 1

## 2020-09-06 MED ORDER — ONDANSETRON HCL 4 MG PO TABS: 4.0000 mg | ORAL_TABLET | Freq: Four times a day (QID) | ORAL | Status: DC | PRN

## 2020-09-06 MED ORDER — LEVOTHYROXINE SODIUM 100 MCG PO TABS
100.0000 ug | ORAL_TABLET | ORAL | Status: DC
  Administered 2020-09-06: 100 ug via ORAL
  Filled 2020-09-06 (×2): qty 1

## 2020-09-06 MED ORDER — POLYETHYLENE GLYCOL 3350 17 G PO PACK: 17.0000 g | PACK | Freq: Every day | ORAL | Status: DC | PRN

## 2020-09-06 MED ORDER — ALBUTEROL SULFATE (2.5 MG/3ML) 0.083% IN NEBU: 2.5000 mg | INHALATION_SOLUTION | RESPIRATORY_TRACT | Status: DC | PRN

## 2020-09-06 MED ORDER — MOMETASONE FURO-FORMOTEROL FUM 100-5 MCG/ACT IN AERO
2.0000 | INHALATION_SPRAY | Freq: Every day | RESPIRATORY_TRACT | Status: DC
  Administered 2020-09-07 – 2020-09-08 (×2): 2 via RESPIRATORY_TRACT
  Filled 2020-09-06 (×2): qty 8.8

## 2020-09-06 MED ORDER — METOPROLOL TARTRATE 12.5 MG HALF TABLET
12.5000 mg | ORAL_TABLET | Freq: Two times a day (BID) | ORAL | Status: DC
  Administered 2020-09-06 – 2020-09-08 (×5): 12.5 mg via ORAL
  Filled 2020-09-06 (×5): qty 1

## 2020-09-06 MED ORDER — SODIUM CHLORIDE 0.9 % IV SOLN: INTRAVENOUS | Status: DC

## 2020-09-06 MED ORDER — ACETAMINOPHEN 650 MG RE SUPP: 650.0000 mg | Freq: Four times a day (QID) | RECTAL | Status: DC | PRN

## 2020-09-06 MED ORDER — ACETAMINOPHEN 325 MG PO TABS: 650.0000 mg | ORAL_TABLET | Freq: Four times a day (QID) | ORAL | Status: DC | PRN

## 2020-09-06 MED ORDER — PANTOPRAZOLE SODIUM 40 MG PO TBEC
40.0000 mg | DELAYED_RELEASE_TABLET | Freq: Every morning | ORAL | Status: DC
  Administered 2020-09-06 – 2020-09-08 (×3): 40 mg via ORAL
  Filled 2020-09-06 (×3): qty 1

## 2020-09-06 MED ORDER — ATORVASTATIN CALCIUM 10 MG PO TABS
20.0000 mg | ORAL_TABLET | Freq: Every morning | ORAL | Status: DC
  Administered 2020-09-06 – 2020-09-08 (×3): 20 mg via ORAL
  Filled 2020-09-06 (×3): qty 2

## 2020-09-06 MED ORDER — CLOPIDOGREL BISULFATE 75 MG PO TABS
75.0000 mg | ORAL_TABLET | Freq: Every morning | ORAL | Status: DC
  Administered 2020-09-06 – 2020-09-08 (×3): 75 mg via ORAL
  Filled 2020-09-06 (×3): qty 1

## 2020-09-06 MED ORDER — POTASSIUM CHLORIDE 20 MEQ PO PACK
40.0000 meq | PACK | Freq: Once | ORAL | Status: AC
  Administered 2020-09-06: 40 meq via ORAL
  Filled 2020-09-06: qty 2

## 2020-09-06 NOTE — ED Notes (Signed)
Patient transported to CT 

## 2020-09-06 NOTE — ED Notes (Signed)
2nd bag of K started. Family provided with blankets. Pt repositioned in bed and no other requests at this time

## 2020-09-06 NOTE — Progress Notes (Addendum)
  PROGRESS NOTE    Jocelyn Moss  VAN:[id number] DOB: [birth year] DOA: 09/05/2020  PCP: Susy Frizzle, MD    LOS - 0    Patient admitted after midnight with severe electrolyte abnormalities, syncope and A-fib with RVR.  Interval subjective: Pt feels better today.  No acute complaints.  Not enjoying the lunch tray very much.  She reportedly works outside in her yard regularly, daughter wonders if that how her electrolytes got so low.    Exam: awake, alert, NAD, conversational Neuro: A&Ox3, normal speech, no gross focal motor deficits  Extremities: moves all, normal tone HENT: hearing grossly normal, moist MM's Abdomen: soft non-tender Lungs clear Heart +S1/S2 and currently RRR   Principal Problem:   Atrial fibrillation with rapid ventricular response (HCC) Active Problems:   Mixed hyperlipidemia   Hypothyroidism   Syncope   Ileostomy in place (HCC)   Mild intermittent asthma, uncomplicated   Hyponatremia   Hypokalemia   Hypomagnesemia   Acute cystitis without hematuria    I have reviewed the full H&P by Dr. Cyd Silence in detail, and I agree with the assessment and plan as outlined therein. In addition: --Electrolytes improved with replacement and sodium better with IV fluids --Leukocytosis - follow blood culture; might be reactive. Pt afebrile, UA negative (recent UTI noted), no obvious source of any infection.  Monitor clinically and follow CBC's.  Suspect A-fib RVR led to her syncopal episode and was triggered by her severe electrolyte derangements, in addition to dehydration.  No Charge    Ezekiel Slocumb, DO [facility]   If 7PM-7AM, please contact night-coverage [email or web address] 09/06/2020, 4:10 PM

## 2020-09-06 NOTE — H&P (Signed)
History and Physical    Jocelyn Moss XTK:[id number] DOB: 25-Apr-1935 DOA: 09/05/2020  PCP: Susy Frizzle, MD  Patient coming from: Home via EMS   Chief Complaint:  Chief Complaint  Patient presents with   Near Syncope     HPI:    85 year old female with past medical history of hypertension, hyperlipidemia, hypothyroidism, asthma, atrial fibrillation, remote history of breast cancer (1990), remote history of colon cancer (status post colectomy and ileostomy in 2000) as well as history of right renal cell carcinoma status post partial nephrectomy who presents to [facility] emergency department after experiencing an episode of loss of consciousness.  History is been obtained from both the patient as well as her daughter who sits at the bedside.  Patient explains that slightly over a week ago she was experiencing episodes of dysuria and urinary frequency.  At that time, patient was diagnosed with a urinary tract infection by her primary care provider.  Patient was placed on a 7-day course of ciprofloxacin which she took as instructed.  Patient states that she transiently felt better however for the past 2 days patient has developed extremely poor appetite and generalized weakness.  This is been associated with intermittent mild to moderate lower abdominal pain, intermittent chills and low-grade fevers in excess of 99 F.  Patient denies any nausea, vomiting, sick contacts, recent travel or contact with confirmed COVID-19 infection.  Because of patient's rapid development of symptoms as noted above patient was seen once again by her primary care provider at her home (they are related) who instructed her to go to the emergency department for evaluation.  Prior to leaving to go to the emergency department she was standing in the kitchen preparing some food when she suddenly began to experience lightheadedness followed by loss of consciousness witnessed by both her husband as well as  her daughter.  It was at this point that EMS was contacted.  Upon EMS arrival and evaluation patient was noted to be in rapid atrial fibrillation with heart rates in excess of 170 bpm.  Patient was administered a 500 cc normal saline bolus which seemed to result in conversion to sinus rhythm.  Then, according to EMS, patient then converted back into suspected SVT for which she received 6 mg of adenosine and an additional 500 cc of normal saline which resulted in once again conversion back to sinus rhythm.  Upon arrival in the emergency department patient has mostly been in sinus rhythm but did convert to rapid atrial fibrillation 1 time prompting administration of 2.5 mg of intravenous metoprolol.  Patient was noted to have substantial hyponatremia of 124 with substantial hypokalemia of 2.8 and substantial hypomagnesemia of 1.0.  Patient was administered 20 mill equivalents of intravenous potassium, 40 mill equivalents of oral potassium and 2 g of intravenous magnesium sulfate.  The hospitalist group was then called to assess the patient for mission to the hospital.  Review of Systems:   ROS  Past Medical History:  Diagnosis Date   Atrial fibrillation ([place])    Breast cancer ([place]) 1990   Chest pain    Colon cancer ([place])    ileostomy 2000   Dysrhythmia    History of colon cancer    Hypercholesterolemia    Hypothyroidism    Multiple lacunar infarcts ([place])    MVP (mitral valve prolapse)    Osteoporosis    osteopenia   Personal history of chemotherapy    6 months   Personal history of radiation therapy  6 months   Renal cell carcinoma (HCC)    stage 1 s/p resection Dr. Tresa Moore 2020   SOB (shortness of breath)     Past Surgical History:  Procedure Laterality Date   BREAST BIOPSY     BREAST LUMPECTOMY Left    COLON SURGERY  2000   ileostomy   ILEOSTOMY     ROBOTIC ASSITED PARTIAL NEPHRECTOMY Right 02/01/2018   Procedure: XI ROBOTIC ASSISTED RETROPERITONEAL PARTIAL NEPHRECTOMY;   Surgeon: Alexis Frock, MD;  Location: [facility] ORS;  Service: Urology;  Laterality: Right;     reports that she has never smoked. She has never used smokeless tobacco. She reports that she does not drink alcohol and does not use drugs.  Allergies  Allergen Reactions   Crestor [Rosuvastatin Calcium] Other (See Comments)    Joint pain, effected liver function    Zetia [Ezetimibe] Other (See Comments)    Joint pain, effected liver function     Family History  Problem Relation Age of Onset   Lung cancer Mother    Lung cancer Father    Lung cancer Sister    Breast cancer Sister      Prior to Admission medications   Medication Sig Start Date End Date Taking? Authorizing Provider  acetaminophen (TYLENOL) 500 MG tablet Take 500 mg by mouth every 6 (six) hours as needed (pain).   Yes [provider]  atorvastatin (LIPITOR) 20 MG tablet TAKE 1 TABLET BY MOUTH EVERY DAY Patient taking differently: Take 20 mg by mouth every morning. 03/03/20  Yes Susy Frizzle, MD  Calcium Carb-Cholecalciferol (CALCIUM 600+D) 600-800 MG-UNIT TABS Take 1 tablet by mouth 2 (two) times daily.   Yes [provider]  clopidogrel (PLAVIX) 75 MG tablet TAKE 1 TABLET BY MOUTH EVERY DAY Patient taking differently: Take 75 mg by mouth every morning. 05/29/20  Yes Susy Frizzle, MD  DULERA 100-5 MCG/ACT AERO USE 2 INHALATIONS ORALLY   TWICE DAILY Patient taking differently: Inhale 2 puffs into the lungs at bedtime. 06/24/20  Yes Susy Frizzle, MD  fluticasone (FLONASE) 50 MCG/ACT nasal spray Place 2 sprays into both nostrils daily. Patient taking differently: Place 2 sprays into both nostrils daily as needed for allergies or rhinitis. 07/14/20  Yes Susy Frizzle, MD  levothyroxine (SYNTHROID) 100 MCG tablet Take 1 tablet (100 mcg total) by mouth every other day. 05/20/20  Yes Susy Frizzle, MD  levothyroxine (SYNTHROID) 112 MCG tablet Take 1 tablet (112 mcg total) by mouth every other day.  05/20/20  Yes Susy Frizzle, MD  metoprolol tartrate (LOPRESSOR) 25 MG tablet Take 0.5 tablets (12.5 mg total) by mouth 2 (two) times daily as needed (palpitations). 07/04/19  Yes Jettie Booze, MD  Multiple Vitamins-Minerals (PRESERVISION AREDS 2) CAPS Take 1 capsule by mouth 2 (two) times daily.   Yes [provider]  pantoprazole (PROTONIX) 40 MG tablet TAKE 1 TABLET BY MOUTH EVERY DAY Patient taking differently: Take 40 mg by mouth every morning. 09/01/20  Yes Susy Frizzle, MD  promethazine (PHENERGAN) 25 MG tablet Take 1 tablet (25 mg total) by mouth every 8 (eight) hours as needed for nausea or vomiting. 01/01/20  Yes Susy Frizzle, MD  cephALEXin (KEFLEX) 500 MG capsule Take 500 mg by mouth 3 (three) times daily. 08/23/20   [provider]  Respiratory Therapy Supplies (FLUTTER) DEVI 1 Device by Does not apply route as directed. 06/06/19   Mannam, Hart Robinsons, MD  sulfamethoxazole-trimethoprim (BACTRIM DS) 800-160 MG tablet  Take 1 tablet by mouth 2 (two) times daily. 09/05/20   Susy Frizzle, MD    Physical Exam: Vitals:   09/05/20 2200 09/05/20 2230 09/05/20 2232 09/05/20 2300  BP: (!) 116/58 110/81  112/60  Pulse: (!) 47 (!) 49  (!) 46  Resp: (!) 32 20  (!) 28  Temp:      TempSrc:      SpO2: 98% 98%  97%  Weight:   64.4 kg   Height:   5\' 6"  (1.676 m)     Constitutional: Awake alert and oriented x3, no associated distress.   Skin: no rashes, no lesions, poor skin turgor noted. Eyes: Pupils are equally reactive to light.  No evidence of scleral icterus or conjunctival pallor.  ENMT: Extremely dry t mucous membranes noted.  Posterior pharynx clear of any exudate or lesions.   Neck: normal, supple, no masses, no thyromegaly.  No evidence of jugular venous distension.   Respiratory: clear to auscultation bilaterally, no wheezing, no crackles. Normal respiratory effort. No accessory muscle use.  Cardiovascular: Regular rate and rhythm, no murmurs / rubs /  gallops. No extremity edema. 2+ pedal pulses. No carotid bruits.  Chest:   Nontender without crepitus or deformity.   Back:   Nontender without crepitus or deformity. Abdomen: Ileostomy noted to be in place with visible pink mucosa and no evidence of blood or diarrhea.  Abdomen exhibits generalized tenderness however is soft.  No evidence of intra-abdominal masses.  Positive bowel sounds noted in all quadrants.   Musculoskeletal: No joint deformity upper and lower extremities. Good ROM, no contractures. Normal muscle tone.  Neurologic: CN 2-12 grossly intact. Sensation intact.  Patient moving all 4 extremities spontaneously.  Patient is following all commands.  Patient is responsive to verbal stimuli.   Psychiatric: Patient exhibits normal mood with appropriate affect.  Patient seems to possess insight as to their current situation.     Labs on Admission: I have personally reviewed following labs and imaging studies -   CBC: Recent Labs  Lab 09/05/20 2036  WBC 17.6*  NEUTROABS 16.7*  HGB 8.9*  HCT 26.8*  MCV 92.1  PLT 027   Basic Metabolic Panel: Recent Labs  Lab 09/05/20 2036  NA 124*  K 2.8*  CL 93*  CO2 20*  GLUCOSE 130*  BUN 33*  CREATININE 2.18*  CALCIUM 8.7*  MG 1.0*   GFR: Estimated Creatinine Clearance: 18.3 mL/min (A) (by C-G formula based on SCr of 2.18 mg/dL (H)). Liver Function Tests: Recent Labs  Lab 09/05/20 2036  AST 27  ALT 17  ALKPHOS 95  BILITOT 2.0*  PROT 6.5  ALBUMIN 3.2*   No results for input(s): LIPASE, AMYLASE in the last 168 hours. No results for input(s): AMMONIA in the last 168 hours. Coagulation Profile: No results for input(s): INR, PROTIME in the last 168 hours. Cardiac Enzymes: No results for input(s): CKTOTAL, CKMB, CKMBINDEX, TROPONINI in the last 168 hours. BNP (last 3 results) No results for input(s): PROBNP in the last 8760 hours. HbA1C: No results for input(s): HGBA1C in the last 72 hours. CBG: No results for input(s):  GLUCAP in the last 168 hours. Lipid Profile: No results for input(s): CHOL, HDL, LDLCALC, TRIG, CHOLHDL, LDLDIRECT in the last 72 hours. Thyroid Function Tests: No results for input(s): TSH, T4TOTAL, FREET4, T3FREE, THYROIDAB in the last 72 hours. Anemia Panel: No results for input(s): VITAMINB12, FOLATE, FERRITIN, TIBC, IRON, RETICCTPCT in the last 72 hours. Urine analysis:    Component  Value Date/Time   COLORURINE YELLOW 10/12/2019 1234   APPEARANCEUR CLOUDY (A) 10/12/2019 1234   LABSPEC 1.020 10/12/2019 1234   PHURINE 5.5 10/12/2019 1234   GLUCOSEU NEGATIVE 10/12/2019 1234   HGBUR NEGATIVE 10/12/2019 1234   BILIRUBINUR NEGATIVE 02/06/2016 1634   KETONESUR NEGATIVE 10/12/2019 1234   PROTEINUR NEGATIVE 10/12/2019 1234   UROBILINOGEN 1 12/24/2013 1010   NITRITE NEGATIVE 10/12/2019 1234   LEUKOCYTESUR 1+ (A) 10/12/2019 1234    Radiological Exams on Admission - Personally Reviewed: DG Chest Port 1 View  Result Date: 09/05/2020 CLINICAL DATA:  Weakness.  Fever and near syncope. EXAM: PORTABLE CHEST 1 VIEW COMPARISON:  Radiograph 05/18/2019.  CT 09/21/2019 FINDINGS: Patient is rotated. Stable heart size and mediastinal contours. Mild lingular opacity corresponds to bronchiectasis on prior CT. No acute airspace disease. No pulmonary edema. No pneumothorax or large pleural effusion. No acute osseous abnormalities are seen. The bones are under mineralized. IMPRESSION: No acute abnormality. Electronically Signed   By: Keith Rake M.D.   On: 09/05/2020 20:26    EKG: Personally reviewed.  Rhythm is tachycardia with heart rate of 91 bpm.  Notable evidence of PVCs.  No dynamic ST segment changes appreciated.  Assessment/Plan Principal Problem:   Atrial fibrillation with rapid ventricular response (HCC)  Patient found to be suffering from rapid atrial fibrillation per EMS upon their arrival to the patient's house after she suffered an episode of syncope. Patient was noted to convert back  to rapid atrial fibrillation briefly after arrival in the emergency department prompting administration of 2.5 mg of intravenous metoprolol. Upon review of the chart, it seems the patient has a known history of atrial fibrillation since at least 2014. Patient has been on full dose anticoagulation in the past but is currently only on Plavix. Additionally, patient is no longer on scheduled AV nodal blocking agents and is only on metoprolol "as needed" although patient does not ever seem to take this medication. Considering patient's substantial electrolyte abnormalities including substantial hyponatremia hypokalemia and hypomagnesemia volume depletion with electrolyte derangement is likely the cause of patient's bouts of rapid atrial fibrillation. Working to replace potassium and magnesium stores.  Gently hydrating patient with intravenous isotonic fluids. Placing patient on very low-dose of p.o. metoprolol tartrate 12.5 mg twice daily as patient has a documented history of being unable to tolerate higher doses of metoprolol in the past due to bradycardia. Monitoring patient telemetry Echocardiogram in the morning Discussed the possibility of being placed back on full dose anticoagulation with the patient and daughter, they stated that they will think about their options which will be followed up on by the day provider.  Active Problems:   Syncope  Considering substantial clinical evidence of volume depletion on exam, syncope is likely orthostatic in nature Patient has already received 1.5 L of isotonic fluids between t EMS and emergency department. We will continue gentle intravenous hydration as we monitor sodium levels. Monitoring patient on telemetry Echocardiogram in the morning    Hyponatremia  Patient exhibiting a modest hyponatremia of 124 on arrival This is likely a hypovolemic hyponatremia possibly secondary to ongoing ostomy output or secondary to poor oral intake due to recent  diagnosis of urinary tract infection  Hydrating patient gently with intravenous isotonic fluids while performing serial chemistries to monitor sodium levels closely and minimize risk of central pontine myelinolysis Obtaining serum osmolality, urine osmolality and serum sodium.    Acute cystitis without hematuria  Patient has been experiencing lower urinary tract symptoms for over 1  week, prompting diagnosis of a urinary tract infection by her outpatient provider and completion of a 1 week course of ciprofloxacin. Patient continues to experience generalized weakness, poor appetite, chills and low-grade fevers according to the patient and daughter Patient exhibiting substantial leukocytosis of 17.6 on arrival Patient exhibiting generalized tenderness on examination Obtaining repeat urinalysis and CT imaging of abdomen and pelvis.  If either are suggestive of infection patient will be placed on intravenous antibiotics with ceftriaxone.    Mixed hyperlipidemia  Continuing home regimen of lipid lowering therapy.    Hypothyroidism  Resume home regimen of Synthroid    Mild intermittent asthma, uncomplicated  As needed bronchodilator therapy for shortness of breath and wheezing. No evidence of asthma exacerbation at this time    Hypokalemia  Aggressively replacing potassium stores as mentioned above Concurrently replacing magnesium levels Monitoring with serial chemistries.    Hypomagnesemia  Replacing with intravenous magnesium sulfate Monitoring serial magnesium levels with serial chemistries.   Code Status:  Full code Family Communication: Daughter is at bedside who has been updated on plan of care.  Status is: Observation  The patient remains OBS appropriate and will d/c before 2 midnights.  Dispo: The patient is from: Home              Anticipated d/c is to: Home              Patient currently is not medically stable to d/c.   Difficult to place patient No         Vernelle Emerald MD [facility] Pager [phone]  If 7PM-7AM, please contact night-coverage [email or web address] Use universal [place] password for that web site. If you do not have the password, please call the hospital operator.  09/06/2020, 1:28 AM

## 2020-09-06 NOTE — Progress Notes (Signed)
  Echocardiogram 2D Echocardiogram has been performed.  Michiel Cowboy 09/06/2020, 11:48 AM

## 2020-09-07 DIAGNOSIS — K810 Acute cholecystitis: Secondary | ICD-10-CM | POA: Diagnosis present

## 2020-09-07 DIAGNOSIS — Z801 Family history of malignant neoplasm of trachea, bronchus and lung: Secondary | ICD-10-CM | POA: Diagnosis not present

## 2020-09-07 DIAGNOSIS — E782 Mixed hyperlipidemia: Secondary | ICD-10-CM | POA: Diagnosis present

## 2020-09-07 DIAGNOSIS — Z932 Ileostomy status: Secondary | ICD-10-CM | POA: Diagnosis not present

## 2020-09-07 DIAGNOSIS — Z853 Personal history of malignant neoplasm of breast: Secondary | ICD-10-CM | POA: Diagnosis not present

## 2020-09-07 DIAGNOSIS — I341 Nonrheumatic mitral (valve) prolapse: Secondary | ICD-10-CM | POA: Diagnosis present

## 2020-09-07 DIAGNOSIS — Z905 Acquired absence of kidney: Secondary | ICD-10-CM | POA: Diagnosis not present

## 2020-09-07 DIAGNOSIS — Z7902 Long term (current) use of antithrombotics/antiplatelets: Secondary | ICD-10-CM | POA: Diagnosis not present

## 2020-09-07 DIAGNOSIS — Z803 Family history of malignant neoplasm of breast: Secondary | ICD-10-CM | POA: Diagnosis not present

## 2020-09-07 DIAGNOSIS — Z888 Allergy status to other drugs, medicaments and biological substances status: Secondary | ICD-10-CM | POA: Diagnosis not present

## 2020-09-07 DIAGNOSIS — R630 Anorexia: Secondary | ICD-10-CM | POA: Diagnosis present

## 2020-09-07 DIAGNOSIS — E861 Hypovolemia: Secondary | ICD-10-CM | POA: Diagnosis present

## 2020-09-07 DIAGNOSIS — I10 Essential (primary) hypertension: Secondary | ICD-10-CM | POA: Diagnosis present

## 2020-09-07 DIAGNOSIS — Z6822 Body mass index (BMI) 22.0-22.9, adult: Secondary | ICD-10-CM | POA: Diagnosis not present

## 2020-09-07 DIAGNOSIS — E860 Dehydration: Secondary | ICD-10-CM | POA: Diagnosis present

## 2020-09-07 DIAGNOSIS — J4520 Mild intermittent asthma, uncomplicated: Secondary | ICD-10-CM | POA: Diagnosis present

## 2020-09-07 DIAGNOSIS — Z85528 Personal history of other malignant neoplasm of kidney: Secondary | ICD-10-CM | POA: Diagnosis not present

## 2020-09-07 DIAGNOSIS — E876 Hypokalemia: Secondary | ICD-10-CM | POA: Diagnosis present

## 2020-09-07 DIAGNOSIS — Z20822 Contact with and (suspected) exposure to covid-19: Secondary | ICD-10-CM | POA: Diagnosis present

## 2020-09-07 DIAGNOSIS — I4891 Unspecified atrial fibrillation: Secondary | ICD-10-CM | POA: Diagnosis present

## 2020-09-07 DIAGNOSIS — Z85038 Personal history of other malignant neoplasm of large intestine: Secondary | ICD-10-CM | POA: Diagnosis not present

## 2020-09-07 DIAGNOSIS — E039 Hypothyroidism, unspecified: Secondary | ICD-10-CM | POA: Diagnosis present

## 2020-09-07 DIAGNOSIS — N3000 Acute cystitis without hematuria: Secondary | ICD-10-CM | POA: Diagnosis present

## 2020-09-07 DIAGNOSIS — E871 Hypo-osmolality and hyponatremia: Secondary | ICD-10-CM | POA: Diagnosis present

## 2020-09-07 LAB — CBC
HCT: 28.6 % — ABNORMAL LOW (ref 36.0–46.0)
Hemoglobin: 9.6 g/dL — ABNORMAL LOW (ref 12.0–15.0)
MCH: 31.2 pg (ref 26.0–34.0)
MCHC: 33.6 g/dL (ref 30.0–36.0)
MCV: 92.9 fL (ref 80.0–100.0)
Platelets: 153 10*3/uL (ref 150–400)
RBC: 3.08 MIL/uL — ABNORMAL LOW (ref 3.87–5.11)
RDW: 13.8 % (ref 11.5–15.5)
WBC: 9.6 10*3/uL (ref 4.0–10.5)
nRBC: 0 % (ref 0.0–0.2)

## 2020-09-07 LAB — SODIUM: Sodium: 130 mmol/L — ABNORMAL LOW (ref 135–145)

## 2020-09-07 LAB — CORTISOL-AM, BLOOD: Cortisol - AM: 21.3 ug/dL (ref 6.7–22.6)

## 2020-09-07 LAB — MAGNESIUM: Magnesium: 1.7 mg/dL (ref 1.7–2.4)

## 2020-09-07 MED ORDER — MAGNESIUM SULFATE 2 GM/50ML IV SOLN
2.0000 g | Freq: Once | INTRAVENOUS | Status: AC
  Administered 2020-09-07: 2 g via INTRAVENOUS
  Filled 2020-09-07: qty 50

## 2020-09-07 NOTE — Progress Notes (Signed)
Physical Therapy Note   Patient screened by physical therapist. Demonstrates safe mobility without need for physical assistance or AD. Ambulates in hallways and tolerates dynamic balance activities without loss of balance. VSS throughout assessment. Asymptomatic. Low fall risk category based on BERG balance test today. Minimal instability noted. Pt declines home health follow-up. Adequate for d/c from therapy standpoint.   Ellouise Newer, PT

## 2020-09-08 LAB — RESP PANEL BY RT-PCR (FLU A&B, COVID) ARPGX2
Influenza A by PCR: NEGATIVE
Influenza B by PCR: NEGATIVE
SARS Coronavirus 2 by RT PCR: NEGATIVE

## 2020-09-08 LAB — BASIC METABOLIC PANEL
Anion gap: 5 (ref 5–15)
BUN: 19 mg/dL (ref 8–23)
CO2: 23 mmol/L (ref 22–32)
Calcium: 7.8 mg/dL — ABNORMAL LOW (ref 8.9–10.3)
Chloride: 106 mmol/L (ref 98–111)
Creatinine, Ser: 1.4 mg/dL — ABNORMAL HIGH (ref 0.44–1.00)
GFR, Estimated: 37 mL/min — ABNORMAL LOW (ref >60)
Glucose, Bld: 105 mg/dL — ABNORMAL HIGH (ref 70–99)
Potassium: 3.7 mmol/L (ref 3.5–5.1)
Sodium: 134 mmol/L — ABNORMAL LOW (ref 135–145)

## 2020-09-08 LAB — MAGNESIUM: Magnesium: 1.9 mg/dL (ref 1.7–2.4)

## 2020-09-08 MED ORDER — LEVOTHYROXINE SODIUM 100 MCG PO TABS: 100.0000 ug | ORAL_TABLET | ORAL | Status: DC

## 2020-09-08 MED ORDER — METOPROLOL TARTRATE 25 MG PO TABS: 12.5000 mg | ORAL_TABLET | Freq: Two times a day (BID) | ORAL | 3 refills | Status: DC | PRN

## 2020-09-08 NOTE — Plan of Care (Signed)

## 2020-09-08 NOTE — Discharge Summary (Signed)
Physician Discharge Summary  Jocelyn Moss KGU:[id number] DOB: [birth year] DOA: 09/05/2020  PCP: Susy Frizzle, MD  Admit date: 09/05/2020 Discharge date: 09/08/2020  Admitted From: home Disposition:  home  Recommendations for Outpatient Follow-up:  Follow up with PCP in 1-2 weeks Please obtain BMP/CBC in one week Follow up with Cardiology  Home Health: none  Equipment/Devices: none   Discharge Condition: stable  CODE STATUS: full  Diet recommendation: Heart Healthy   Discharge Diagnoses: Principal Problem:   Atrial fibrillation with rapid ventricular response ([place]) Active Problems:   Mixed hyperlipidemia   Hypothyroidism   Syncope   Ileostomy in place ([place])   Mild intermittent asthma, uncomplicated   Hyponatremia   Hypokalemia   Hypomagnesemia   Acute cystitis without hematuria    Summary of HPI and Hospital Course:  Patient admitted with severe electrolyte abnormalities after having a syncopal episode at home.  Also found to be in A-fib with RVR.   Atrial fibrillation with RVR - POA most likely due to electrolyte derangements.  EMS reported A-fib on their arrival to pt's home.  Converted back to rapid atrial fibrillation briefly after arrival in the ED, treated with IV metoprolol.   Upon review of the chart, it seems the patient has a known history of atrial fibrillation since at least 2014. She has however also had ambulatory heart monitoring more recently that did not show A-fib Patient has been on full dose anticoagulation in the past but is currently only on Plavix. Was on PRN metoprolol at home, but not taken as pt does not experience palpitations with tachycardia, and A-fib not seen on long monitoring.  She also appears to have had significant bradycardia with scheduled metoprolol.  --Replaced electrolytes and hydrated with IV fluids. --Resumed on scheduled metoprolol 12.5 mg BID with  Specific hold parameters for both heart rate and blood pressure.   Discussed checking BP and HR prior to taking the medication at home.  Patient and her daughter at bedside in agreement with this plan and outpatient follow up with cardiology.   Need to consider anticoagulation.      Syncope - due to A--fib RVR which was most likely precipitated by severe electrolyte derangements.   Patient without any dizziness, lightheadedness or pre-syncopal symptoms here.  Clinically improved with IV hydration and correction of electrolytes.  No events on telemetry once A-fib RVR was controlled.  Echocardiogram obtained showed EF 62-83%, normal diastolic function, moderate bi-atrial dilation.     Hyponatremia - presented with Na 124.  Likely a hypovolemic hyponatremia possibly secondary to ongoing ostomy output or secondary to poor oral intake, ?dehydration from working in yard/garden in the heat. Hydrated with IV fluids and sodium level improved. Patient's weakness improved. She continued to report poor appetite.   Advised being sure to drink plenty of fluids, especially if out in hot weather.    Hypokalemia Hypomagnesemia  - replaced potassium and magnesium / - close follow up labs outpatient recommended      Acute cystitis without hematuria - recent  Patient reported lower urinary tract symptoms for over 1 week, diagnosis of UTI by her outpatient provider and completion of a 1 week course of ciprofloxacin. Since then, gradually worsened generalized weakness, poor appetite, chills and low-grade fevers according to the patient and daughter on admission. Leukocytosis of 17.6 on arrival UA negative here.  Antibiotics were deferred. Patient remained free of any urinary symptoms. Weakness improved with corrected electrolytes and hydration.     Mixed hyperlipidemia - on Lipitor  Hypothyroidism - on Synthroid     Mild intermittent asthma - no s/sx's of exacerbation  - PRN bronchodilator         Discharge Instructions   Discharge Instructions     Call MD for:   extreme fatigue   Complete by: As directed    Call MD for:  persistant dizziness or light-headedness   Complete by: As directed    Call MD for:  persistant nausea and vomiting   Complete by: As directed    Call MD for:  severe uncontrolled pain   Complete by: As directed    Call MD for:  temperature >100.4   Complete by: As directed    Diet - low sodium heart healthy   Complete by: As directed    Discharge instructions   Complete by: As directed    Please be sure to stay well-hydrated and eat well.   If you feel weak, dizzy, lightheaded - like you did before coming to the hospital - check your BP and heart rate.  For Metoprolol - this medication will help to prevent your heart rate from getting too fast.  --Before taking metoprolol, check your BP and HR.  --If your Heart Rate is less than 60 - skip that dose  --If you Top BP # is less than 110 or Bottom BP number less than 60 -- skip that dose.  Recommend writing down your BP and HR readings, keeping a log of these to bring to your doctor's appointments will help them in deciding if any changes needed to your medications.  Please follow up with primary care to have labs re-checked in about 1 week.   Increase activity slowly   Complete by: As directed       Allergies as of 09/08/2020       Reactions   Crestor [rosuvastatin Calcium] Other (See Comments)   Joint pain, effected liver function   Zetia [ezetimibe] Other (See Comments)   Joint pain, effected liver function        Medication List     STOP taking these medications    cephALEXin 500 MG capsule Commonly known as: KEFLEX   sulfamethoxazole-trimethoprim 800-160 MG tablet Commonly known as: BACTRIM DS       TAKE these medications    acetaminophen 500 MG tablet Commonly known as: TYLENOL Take 500 mg by mouth every 6 (six) hours as needed (pain).   atorvastatin 20 MG tablet Commonly known as: LIPITOR TAKE 1 TABLET BY MOUTH EVERY DAY What changed:  when to take this   Calcium 600+D 600-800 MG-UNIT Tabs Generic drug: Calcium Carb-Cholecalciferol Take 1 tablet by mouth 2 (two) times daily.   clopidogrel 75 MG tablet Commonly known as: PLAVIX TAKE 1 TABLET BY MOUTH EVERY DAY What changed: when to take this   Dulera 100-5 MCG/ACT Aero Generic drug: mometasone-formoterol USE 2 INHALATIONS ORALLY   TWICE DAILY What changed: See the new instructions.   fluticasone 50 MCG/ACT nasal spray Commonly known as: FLONASE Place 2 sprays into both nostrils daily. What changed:  when to take this reasons to take this   Flutter Devi 1 Device by Does not apply route as directed.   levothyroxine 112 MCG tablet Commonly known as: SYNTHROID Take 1 tablet (112 mcg total) by mouth every other day.   levothyroxine 100 MCG tablet Commonly known as: SYNTHROID Take 1 tablet (100 mcg total) by mouth every other day.   metoprolol tartrate 25 MG tablet Commonly known as: LOPRESSOR Take 0.5  tablets (12.5 mg total) by mouth 2 (two) times daily as needed (palpitations).   pantoprazole 40 MG tablet Commonly known as: PROTONIX TAKE 1 TABLET BY MOUTH EVERY DAY What changed: when to take this   PreserVision AREDS 2 Caps Take 1 capsule by mouth 2 (two) times daily.   promethazine 25 MG tablet Commonly known as: PHENERGAN Take 1 tablet (25 mg total) by mouth every 8 (eight) hours as needed for nausea or vomiting.        Allergies  Allergen Reactions   Crestor [Rosuvastatin Calcium] Other (See Comments)    Joint pain, effected liver function    Zetia [Ezetimibe] Other (See Comments)    Joint pain, effected liver function      If you experience worsening of your admission symptoms, develop shortness of breath, life threatening emergency, suicidal or homicidal thoughts you must seek medical attention immediately by calling 911 or calling your MD immediately  if symptoms less severe.    Please note   You were cared for by a hospitalist  during your hospital stay. If you have any questions about your discharge medications or the care you received while you were in the hospital after you are discharged, you can call the unit and asked to speak with the hospitalist on call if the hospitalist that took care of you is not available. Once you are discharged, your primary care physician will handle any further medical issues. Please note that NO REFILLS for any discharge medications will be authorized once you are discharged, as it is imperative that you return to your primary care physician (or establish a relationship with a primary care physician if you do not have one) for your aftercare needs so that they can reassess your need for medications and monitor your lab values.   Consultations: none    Procedures/Studies: CT ABDOMEN PELVIS WO CONTRAST  Result Date: 09/06/2020 CLINICAL DATA:  History of renal cell carcinoma and partial right nephrectomy as well as breast cancer and colon cancer. Presents with syncopal episode with subsequent fall. EXAM: CT ABDOMEN AND PELVIS WITHOUT CONTRAST TECHNIQUE: Multidetector CT imaging of the abdomen and pelvis was performed following the standard protocol without IV contrast. COMPARISON:  Abdomen pelvis CT, dated August 21, 2018, and MR abdomen dated September 27, 2019 FINDINGS: Lower chest: Mild to moderate severity atelectasis is seen within the bilateral lung bases. Mild to moderate severity cardiomegaly is noted. Hepatobiliary: Stable atrophic changes are seen throughout the left lobe of the liver. No focal liver abnormality is seen. Several small gallstones are seen within a mildly distended gallbladder. There is no evidence of gallbladder wall thickening or pericholecystic inflammation. Pancreas: Unremarkable. No pancreatic ductal dilatation or surrounding inflammatory changes. Spleen: Normal in size without focal abnormality. Adrenals/Urinary Tract: Adrenal glands are unremarkable. There is evidence  of a partial right nephrectomy. Kidneys are otherwise normal, without renal calculi or focal lesions. Stable prominence of a right extrarenal pelvis is seen. The urinary bladder is mildly distended and otherwise unremarkable. Stomach/Bowel: There is a small hiatal hernia. Appendix appears normal. No evidence of bowel wall thickening, distention, or inflammatory changes. Vascular/Lymphatic: Aortic atherosclerosis. No enlarged abdominal or pelvic lymph nodes. Reproductive: The uterus is not identified. Other: There is a right lower quadrant ostomy site. Surgical clips are seen within the lateral aspect of the mid left abdomen, posterior aspect of the left lower quadrant and posterior to the rectosigmoid junction. A 4.8 cm x 3.0 cm x 4.4 cm calcified mass is seen within  the omentum along the anterior aspect of the mid abdomen, to the left of midline. This is very mildly increased in size when compared to the prior studies. No abdominopelvic ascites. Musculoskeletal: There is no evidence of acute osseous abnormality. Marked severity scoliosis and multilevel degenerative changes are seen throughout the lumbar spine. IMPRESSION: 1. Calcified omental mass, as described above, which is slightly increased in size when compared to the prior studies. This is likely benign in etiology and may represent sequelae associated with an area of fat necrosis. 2. Cholelithiasis without acute cholecystitis. 3. Chronic atrophic changes involving the left lobe of the liver. 4. Stable postoperative changes with subsequent right lower quadrant ostomy site. Electronically Signed   By: Virgina Norfolk M.D.   On: 09/06/2020 03:10   DG Chest Port 1 View  Result Date: 09/05/2020 CLINICAL DATA:  Weakness.  Fever and near syncope. EXAM: PORTABLE CHEST 1 VIEW COMPARISON:  Radiograph 05/18/2019.  CT 09/21/2019 FINDINGS: Patient is rotated. Stable heart size and mediastinal contours. Mild lingular opacity corresponds to bronchiectasis on prior  CT. No acute airspace disease. No pulmonary edema. No pneumothorax or large pleural effusion. No acute osseous abnormalities are seen. The bones are under mineralized. IMPRESSION: No acute abnormality. Electronically Signed   By: Keith Rake M.D.   On: 09/05/2020 20:26   ECHOCARDIOGRAM COMPLETE  Result Date: 09/06/2020    ECHOCARDIOGRAM REPORT   Patient Name:   Jocelyn Moss Date of Exam: 09/06/2020 Medical Rec #:  [id number]        Height:       66.0 in Accession #:    [id number]       Weight:       142.0 lb Date of Birth:  [birth year]        BSA:          1.729 m Patient Age:    85 years         BP:           117/61 mmHg Patient Gender: F                HR:           73 bpm. Exam Location:  Inpatient Procedure: 2D Echo, Cardiac Doppler and Color Doppler Indications:    Atrial Fibrillation I48.91  History:        Patient has no prior history of Echocardiogram examinations.                 Arrythmias:Atrial Fibrillation, Signs/Symptoms:Shortness of                 Breath and Chest Pain; Risk Factors:Dyslipidemia and Non-Smoker.                 Breast cancer.  Sonographer:    Vickie Epley RDCS Referring Phys: [id number] [place]  1. Left ventricular ejection fraction, by estimation, is 60 to 65%. The left ventricle has normal function. The left ventricle has no regional wall motion abnormalities. Left ventricular diastolic parameters were normal.  2. Right ventricular systolic function is normal. The right ventricular size is normal.  3. Left atrial size was moderately dilated.  4. Right atrial size was moderately dilated.  5. The mitral valve is normal in structure. Mild mitral valve regurgitation. No evidence of mitral stenosis.  6. The aortic valve is tricuspid. Aortic valve regurgitation is not visualized. Mild aortic valve sclerosis is present, with no evidence of aortic valve stenosis.  7. The inferior  vena cava is normal in size with greater than 50% respiratory variability,  suggesting right atrial pressure of 3 mmHg. FINDINGS  Left Ventricle: Left ventricular ejection fraction, by estimation, is 60 to 65%. The left ventricle has normal function. The left ventricle has no regional wall motion abnormalities. The left ventricular internal cavity size was normal in size. There is  no left ventricular hypertrophy. Left ventricular diastolic parameters were normal. Right Ventricle: The right ventricular size is normal. Right ventricular systolic function is normal. Left Atrium: Left atrial size was moderately dilated. Right Atrium: Right atrial size was moderately dilated. Pericardium: There is no evidence of pericardial effusion. Mitral Valve: The mitral valve is normal in structure. Mild mitral valve regurgitation. No evidence of mitral valve stenosis. Tricuspid Valve: The tricuspid valve is normal in structure. Tricuspid valve regurgitation is mild . No evidence of tricuspid stenosis. Aortic Valve: The aortic valve is tricuspid. Aortic valve regurgitation is not visualized. Mild aortic valve sclerosis is present, with no evidence of aortic valve stenosis. Pulmonic Valve: The pulmonic valve was normal in structure. Pulmonic valve regurgitation is not visualized. No evidence of pulmonic stenosis. Aorta: The aortic root is normal in size and structure. Venous: The inferior vena cava is normal in size with greater than 50% respiratory variability, suggesting right atrial pressure of 3 mmHg. IAS/Shunts: No atrial level shunt detected by color flow Doppler.  LEFT VENTRICLE PLAX 2D LVIDd:         4.00 cm      Diastology LVIDs:         2.70 cm      LV e' medial:    7.21 cm/s LV PW:         1.10 cm      LV E/e' medial:  16.0 LV IVS:        1.10 cm      LV e' lateral:   9.21 cm/s LVOT diam:     2.30 cm      LV E/e' lateral: 12.5 LV SV:         87 LV SV Index:   50 LVOT Area:     4.15 cm  LV Volumes (MOD) LV vol d, MOD A2C: 99.9 ml LV vol d, MOD A4C: 112.0 ml LV vol s, MOD A2C: 49.9 ml LV vol s,  MOD A4C: 51.7 ml LV SV MOD A2C:     50.0 ml LV SV MOD A4C:     112.0 ml LV SV MOD BP:      56.9 ml RIGHT VENTRICLE RV S prime:     13.50 cm/s TAPSE (M-mode): 1.8 cm LEFT ATRIUM             Index       RIGHT ATRIUM           Index LA diam:        4.10 cm 2.37 cm/m  RA Area:     24.00 cm LA Vol (A2C):   78.9 ml 45.64 ml/m RA Volume:   76.70 ml  44.36 ml/m LA Vol (A4C):   58.2 ml 33.66 ml/m LA Biplane Vol: 68.4 ml 39.56 ml/m  AORTIC VALVE LVOT Vmax:   95.60 cm/s LVOT Vmean:  67.900 cm/s LVOT VTI:    0.209 m  AORTA Ao Root diam: 3.70 cm Ao Asc diam:  3.70 cm MITRAL VALVE                TRICUSPID VALVE MV Area (PHT): 3.13 cm  TR Peak grad:   24.2 mmHg MV Decel Time: 242 msec     TR Vmax:        246.00 cm/s MV E velocity: 115.00 cm/s MV A velocity: 85.40 cm/s   SHUNTS MV E/A ratio:  1.35         Systemic VTI:  0.21 m                             Systemic Diam: 2.30 cm Kirk Ruths MD Electronically signed by Kirk Ruths MD Signature Date/Time: 09/06/2020/12:45:11 PM    Final    US Abdomen Limited RUQ (LIVER/GB)  Result Date: 09/06/2020 CLINICAL DATA:  Mild epigastric pain x1 day EXAM: ULTRASOUND ABDOMEN LIMITED RIGHT UPPER QUADRANT COMPARISON:  None. FINDINGS: Gallbladder: No gallstones, gallbladder wall thickening, or pericholecystic fluid. Negative sonographic Murphy's sign. Common bile duct: Diameter: 3 mm Liver: No focal lesion identified. Within normal limits in parenchymal echogenicity. Portal vein is patent on color Doppler imaging with normal direction of blood flow towards the liver. Other: None. IMPRESSION: Negative right upper quadrant ultrasound. Electronically Signed   By: Julian Hy M.D.   On: 09/06/2020 03:00       Subjective: pt seen with daughter at bedside.  Feels better.  Appetite still isn't great, but has improved.  Ambulating well.  No acute complaints or events reported.   Discharge Exam: Vitals:   09/08/20 0838 09/08/20 0932  BP:  (!) 157/71  Pulse: 60 (!) 59   Resp: 17   Temp:    SpO2: 97%    Vitals:   09/07/20 2032 09/08/20 0455 09/08/20 0838 09/08/20 0932  BP:  132/67  (!) 157/71  Pulse:  62 60 (!) 59  Resp:  19 17   Temp:  97.6 F (36.4 C)    TempSrc:  Oral    SpO2: 98% 98% 97%   Weight:      Height:        General: Pt is alert, awake, not in acute distress Cardiovascular: RRR, S1/S2 +, no rubs, no gallops Respiratory: CTA bilaterally, no wheezing, no rhonchi Abdominal: Soft, NT, ND, bowel sounds + Extremities: no edema, no cyanosis    The results of significant diagnostics from this hospitalization (including imaging, microbiology, ancillary and laboratory) are listed below for reference.     Microbiology: Recent Results (from the past 240 hour(s))  Resp Panel by RT-PCR (Flu A&B, Covid) Nasopharyngeal Swab     Status: None   Collection Time: 09/05/20  9:30 PM   Specimen: Nasopharyngeal Swab; Nasopharyngeal(NP) swabs in vial transport medium  Result Value Ref Range Status   SARS Coronavirus 2 by RT PCR NEGATIVE NEGATIVE Final    Comment: (NOTE) SARS-CoV-2 target nucleic acids are NOT DETECTED.  The SARS-CoV-2 RNA is generally detectable in upper respiratory specimens during the acute phase of infection. The lowest concentration of SARS-CoV-2 viral copies this assay can detect is 138 copies/mL. A negative result does not preclude SARS-Cov-2 infection and should not be used as the sole basis for treatment or other patient management decisions. A negative result may occur with  improper specimen collection/handling, submission of specimen other than nasopharyngeal swab, presence of viral mutation(s) within the areas targeted by this assay, and inadequate number of viral copies(<138 copies/mL). A negative result must be combined with clinical observations, patient history, and epidemiological information. The expected result is Negative.  Fact Sheet for Patients:  EntrepreneurPulse.com.au  Fact Sheet  for Healthcare  Providers:  IncredibleEmployment.be  This test is no t yet approved or cleared by the [place] and  has been authorized for detection and/or diagnosis of SARS-CoV-2 by FDA under an Emergency Use Authorization (EUA). This EUA will remain  in effect (meaning this test can be used) for the duration of the COVID-19 declaration under Section 564(b)(1) of the Act, 21 U.S.C.section 360bbb-3(b)(1), unless the authorization is terminated  or revoked sooner.       Influenza A by PCR NEGATIVE NEGATIVE Final   Influenza B by PCR NEGATIVE NEGATIVE Final    Comment: (NOTE) The Xpert Xpress SARS-CoV-2/FLU/RSV plus assay is intended as an aid in the diagnosis of influenza from Nasopharyngeal swab specimens and should not be used as a sole basis for treatment. Nasal washings and aspirates are unacceptable for Xpert Xpress SARS-CoV-2/FLU/RSV testing.  Fact Sheet for Patients: EntrepreneurPulse.com.au  Fact Sheet for Healthcare Providers: IncredibleEmployment.be  This test is not yet approved or cleared by the [place] FDA and has been authorized for detection and/or diagnosis of SARS-CoV-2 by FDA under an Emergency Use Authorization (EUA). This EUA will remain in effect (meaning this test can be used) for the duration of the COVID-19 declaration under Section 564(b)(1) of the Act, 21 U.S.C. section 360bbb-3(b)(1), unless the authorization is terminated or revoked.  Performed at [facility], [place] [address], [place], [place] [address]      Labs: BNP (last 3 results) Recent Labs    07/14/20 1233  BNP 542*   Basic Metabolic Panel: Recent Labs  Lab 09/05/20 2036 09/06/20 0516 09/06/20 1157 09/07/20 1019 09/07/20 1407 09/08/20 0900  NA 124* 129* 127*  --  130* 134*  K 2.8* 4.5 4.1  --   --  3.7  CL 93* 97* 98  --   --  106  CO2 20* 21* 20*  --   --  23  GLUCOSE 130* 167* 111*  --   --  105*   BUN 33* 27* 25*  --   --  19  CREATININE 2.18* 1.99* 1.64*  --   --  1.40*  CALCIUM 8.7* 8.8* 8.3*  --   --  7.8*  MG 1.0* 2.2  --  1.7  --  1.9   Liver Function Tests: Recent Labs  Lab 09/05/20 2036  AST 27  ALT 17  ALKPHOS 95  BILITOT 2.0*  PROT 6.5  ALBUMIN 3.2*   No results for input(s): LIPASE, AMYLASE in the last 168 hours. No results for input(s): AMMONIA in the last 168 hours. CBC: Recent Labs  Lab 09/05/20 2036 09/06/20 0516 09/07/20 1019  WBC 17.6* 20.9* 9.6  NEUTROABS 16.7* 19.5*  --   HGB 8.9* 9.3* 9.6*  HCT 26.8* 28.7* 28.6*  MCV 92.1 94.4 92.9  PLT 161 153 153   Cardiac Enzymes: No results for input(s): CKTOTAL, CKMB, CKMBINDEX, TROPONINI in the last 168 hours. BNP: Invalid input(s): POCBNP CBG: No results for input(s): GLUCAP in the last 168 hours. D-Dimer No results for input(s): DDIMER in the last 72 hours. Hgb A1c No results for input(s): HGBA1C in the last 72 hours. Lipid Profile No results for input(s): CHOL, HDL, LDLCALC, TRIG, CHOLHDL, LDLDIRECT in the last 72 hours. Thyroid function studies No results for input(s): TSH, T4TOTAL, T3FREE, THYROIDAB in the last 72 hours.  Invalid input(s): FREET3 Anemia work up No results for input(s): VITAMINB12, FOLATE, FERRITIN, TIBC, IRON, RETICCTPCT in the last 72 hours. Urinalysis    Component Value Date/Time  COLORURINE YELLOW 09/06/2020 0138   APPEARANCEUR CLEAR 09/06/2020 0138   LABSPEC 1.008 09/06/2020 0138   PHURINE 6.0 09/06/2020 0138   GLUCOSEU 50 (A) 09/06/2020 0138   HGBUR NEGATIVE 09/06/2020 0138   BILIRUBINUR NEGATIVE 09/06/2020 0138   KETONESUR NEGATIVE 09/06/2020 0138   PROTEINUR NEGATIVE 09/06/2020 0138   UROBILINOGEN 1 12/24/2013 1010   NITRITE NEGATIVE 09/06/2020 0138   LEUKOCYTESUR NEGATIVE 09/06/2020 0138   Sepsis Labs Invalid input(s): PROCALCITONIN,  WBC,  LACTICIDVEN Microbiology Recent Results (from the past 240 hour(s))  Resp Panel by RT-PCR (Flu A&B, Covid)  Nasopharyngeal Swab     Status: None   Collection Time: 09/05/20  9:30 PM   Specimen: Nasopharyngeal Swab; Nasopharyngeal(NP) swabs in vial transport medium  Result Value Ref Range Status   SARS Coronavirus 2 by RT PCR NEGATIVE NEGATIVE Final    Comment: (NOTE) SARS-CoV-2 target nucleic acids are NOT DETECTED.  The SARS-CoV-2 RNA is generally detectable in upper respiratory specimens during the acute phase of infection. The lowest concentration of SARS-CoV-2 viral copies this assay can detect is 138 copies/mL. A negative result does not preclude SARS-Cov-2 infection and should not be used as the sole basis for treatment or other patient management decisions. A negative result may occur with  improper specimen collection/handling, submission of specimen other than nasopharyngeal swab, presence of viral mutation(s) within the areas targeted by this assay, and inadequate number of viral copies(<138 copies/mL). A negative result must be combined with clinical observations, patient history, and epidemiological information. The expected result is Negative.  Fact Sheet for Patients:  EntrepreneurPulse.com.au  Fact Sheet for Healthcare Providers:  [employer]  This test is no t yet approved or cleared by the [place] FDA and  has been authorized for detection and/or diagnosis of SARS-CoV-2 by FDA under an Emergency Use Authorization (EUA). This EUA will remain  in effect (meaning this test can be used) for the duration of the COVID-19 declaration under Section 564(b)(1) of the Act, 21 U.S.C.section 360bbb-3(b)(1), unless the authorization is terminated  or revoked sooner.       Influenza A by PCR NEGATIVE NEGATIVE Final   Influenza B by PCR NEGATIVE NEGATIVE Final    Comment: (NOTE) The Xpert Xpress SARS-CoV-2/FLU/RSV plus assay is intended as an aid in the diagnosis of influenza from Nasopharyngeal swab specimens and should not be  used as a sole basis for treatment. Nasal washings and aspirates are unacceptable for Xpert Xpress SARS-CoV-2/FLU/RSV testing.  Fact Sheet for Patients: EntrepreneurPulse.com.au  Fact Sheet for Healthcare Providers: IncredibleEmployment[employer]be  This test is not yet approved or cleared by the [place] FDA and has been authorized for detection and/or diagnosis of SARS-CoV-2 by FDA under an Emergency Use Authorization (EUA). This EUA will remain in effect (meaning this test can be used) for the duration of the COVID-19 declaration under Section 564(b)(1) of the Act, 21 U.S.C. section 360bbb-3(b)(1), unless the authorization is terminated or revoked.  Performed at [facility], [place] [address], [place], [place] [address]      Time coordinating discharge: Over 30 minutes  SIGNED:   Ezekiel Slocumb, DO [facility] 09/08/2020, 10:21 AM   If 7PM-7AM, please contact night-coverage [email or web address]

## 2020-09-08 NOTE — Progress Notes (Signed)
Patient discharge complete but the patient is waiting for covid swab before leaving.

## 2020-09-11 DIAGNOSIS — N179 Acute kidney failure, unspecified: Secondary | ICD-10-CM | POA: Insufficient documentation

## 2020-09-11 DIAGNOSIS — R809 Proteinuria, unspecified: Secondary | ICD-10-CM | POA: Insufficient documentation

## 2020-09-11 DIAGNOSIS — R82998 Other abnormal findings in urine: Secondary | ICD-10-CM | POA: Insufficient documentation

## 2020-09-11 DIAGNOSIS — N1832 Chronic kidney disease, stage 3b: Secondary | ICD-10-CM | POA: Insufficient documentation

## 2020-09-15 ENCOUNTER — Telehealth: Payer: Self-pay | Admitting: Family Medicine

## 2020-09-15 ENCOUNTER — Encounter: Payer: Self-pay | Admitting: Family Medicine

## 2020-09-15 ENCOUNTER — Other Ambulatory Visit: Payer: Self-pay

## 2020-09-15 ENCOUNTER — Ambulatory Visit: Payer: Medicare Other | Admitting: Family Medicine

## 2020-09-15 VITALS — BP 120/60 | HR 88 | Temp 97.0°F | Resp 18 | Wt 141.0 lb

## 2020-09-15 DIAGNOSIS — N1832 Chronic kidney disease, stage 3b: Secondary | ICD-10-CM | POA: Diagnosis not present

## 2020-09-15 DIAGNOSIS — E871 Hypo-osmolality and hyponatremia: Secondary | ICD-10-CM | POA: Diagnosis not present

## 2020-09-15 DIAGNOSIS — I480 Paroxysmal atrial fibrillation: Secondary | ICD-10-CM | POA: Diagnosis not present

## 2020-09-15 NOTE — Progress Notes (Addendum)
Subjective:    Patient ID: Jocelyn Moss, female    DOB: [birth year], 85 y.o.   MRN: [id number]  HPI Admit date: 09/05/2020 Discharge date: 09/08/2020  Recommendations for Outpatient Follow-up:  Follow up with PCP in 1-2 weeks Please obtain BMP/CBC in one week Follow up with Cardiology    Discharge Diagnoses: Principal Problem:   Atrial fibrillation with rapid ventricular response ([place]) Active Problems:   Mixed hyperlipidemia   Hypothyroidism   Syncope   Ileostomy in place ([place])   Mild intermittent asthma, uncomplicated   Hyponatremia   Hypokalemia   Hypomagnesemia   Acute cystitis without hematuria       Summary of HPI and Hospital Course:  Patient admitted with severe electrolyte abnormalities after having a syncopal episode at home.  Also found to be in A-fib with RVR.    Atrial fibrillation with RVR - POA most likely due to electrolyte derangements.  EMS reported A-fib on their arrival to pt's home.  Converted back to rapid atrial fibrillation briefly after arrival in the ED, treated with IV metoprolol.   Upon review of the chart, it seems the patient has a known history of atrial fibrillation since at least 2014. She has however also had ambulatory heart monitoring more recently that did not show A-fib Patient has been on full dose anticoagulation in the past but is currently only on Plavix. Was on PRN metoprolol at home, but not taken as pt does not experience palpitations with tachycardia, and A-fib not seen on long monitoring.  She also appears to have had significant bradycardia with scheduled metoprolol.   --Replaced electrolytes and hydrated with IV fluids. --Resumed on scheduled metoprolol 12.5 mg BID with  Specific hold parameters for both heart rate and blood pressure.  Discussed checking BP and HR prior to taking the medication at home.  Patient and her daughter at bedside in agreement with this plan and outpatient follow up with cardiology.   Need to  consider anticoagulation.       Syncope - due to A--fib RVR which was most likely precipitated by severe electrolyte derangements.   Patient without any dizziness, lightheadedness or pre-syncopal symptoms here.  Clinically improved with IV hydration and correction of electrolytes.  No events on telemetry once A-fib RVR was controlled.  Echocardiogram obtained showed EF 03-50%, normal diastolic function, moderate bi-atrial dilation.     Hyponatremia - presented with Na 124.  Likely a hypovolemic hyponatremia possibly secondary to ongoing ostomy output or secondary to poor oral intake, ?dehydration from working in yard/garden in the heat. Hydrated with IV fluids and sodium level improved. Patient's weakness improved. She continued to report poor appetite.   Advised being sure to drink plenty of fluids, especially if out in hot weather.     Hypokalemia Hypomagnesemia  - replaced potassium and magnesium / - close follow up labs outpatient recommended      Acute cystitis without hematuria - recent  Patient reported lower urinary tract symptoms for over 1 week, diagnosis of UTI by her outpatient provider and completion of a 1 week course of ciprofloxacin. Since then, gradually worsened generalized weakness, poor appetite, chills and low-grade fevers according to the patient and daughter on admission. Leukocytosis of 17.6 on arrival UA negative here.  Antibiotics were deferred. Patient remained free of any urinary symptoms. Weakness improved with corrected electrolytes and hydration.     Mixed hyperlipidemia - on Lipitor     Hypothyroidism - on Synthroid     Mild intermittent asthma -  no s/sx's of exacerbation  - PRN bronchodilator        09/15/20 Patient presents today for follow-up.  She looks much better than the last time I saw her.  Her color is better.  The sallow appearance of her face has improved with hydration.  She states that she feels much better and she is much stronger.   Recently I have been monitoring her creatinine and it had risen to 2.2 gradually despite the patient stating that she was drinking plenty of fluids at home.  I was concerned that she had progressed along the pathway of chronic kidney disease further however after vigorous hydration at the hospital, her discharge creatinine was 1.4 leading me to believe that the patient was dehydrated and simply not drinking enough.  She is now drinking "a banana bag" every other day.  Her daughter is trying to get her to drink 64 ounces of fluid gradually throughout the day.  She is no longer working in the heat in her garden.  She denies any chest pain or palpitations or syncope or near syncope.  She denies any further seizure activity.  She is denies any further fever or chills. Past Medical History:  Diagnosis Date   Atrial fibrillation ([place])    Breast cancer ([place]) 1990   Chest pain    Colon cancer ([place])    ileostomy 2000   Dysrhythmia    History of colon cancer    Hypercholesterolemia    Hypothyroidism    Multiple lacunar infarcts (HCC)    MVP (mitral valve prolapse)    Osteoporosis    osteopenia   Personal history of chemotherapy    6 months   Personal history of radiation therapy    6 months   Renal cell carcinoma (HCC)    stage 1 s/p resection Dr. Tresa Moore 2020   SOB (shortness of breath)    Past Surgical History:  Procedure Laterality Date   BREAST BIOPSY     BREAST LUMPECTOMY Left    COLON SURGERY  2000   ileostomy   ILEOSTOMY     ROBOTIC ASSITED PARTIAL NEPHRECTOMY Right 02/01/2018   Procedure: XI ROBOTIC ASSISTED RETROPERITONEAL PARTIAL NEPHRECTOMY;  Surgeon: Alexis Frock, MD;  Location: [facility];  Service: Urology;  Laterality: Right;   Current Outpatient Medications on File Prior to Visit  Medication Sig Dispense Refill   acetaminophen (TYLENOL) 500 MG tablet Take 500 mg by mouth every 6 (six) hours as needed (pain).     atorvastatin (LIPITOR) 20 MG tablet TAKE 1 TABLET BY MOUTH EVERY  DAY (Patient taking differently: Take 20 mg by mouth every morning.) 90 tablet 2   Calcium Carb-Cholecalciferol (CALCIUM 600+D) 600-800 MG-UNIT TABS Take 1 tablet by mouth 2 (two) times daily.     clopidogrel (PLAVIX) 75 MG tablet TAKE 1 TABLET BY MOUTH EVERY DAY (Patient taking differently: Take 75 mg by mouth every morning.) 90 tablet 3   DULERA 100-5 MCG/ACT AERO USE 2 INHALATIONS ORALLY   TWICE DAILY (Patient taking differently: Inhale 2 puffs into the lungs at bedtime.) 39 g 3   fluticasone (FLONASE) 50 MCG/ACT nasal spray Place 2 sprays into both nostrils daily. 16 g 6   levothyroxine (SYNTHROID) 100 MCG tablet Take 1 tablet (100 mcg total) by mouth every other day. 45 tablet 3   levothyroxine (SYNTHROID) 112 MCG tablet Take 1 tablet (112 mcg total) by mouth every other day. 45 tablet 3   metoprolol tartrate (LOPRESSOR) 25 MG tablet Take 0.5 tablets (12.5  mg total) by mouth 2 (two) times daily as needed (palpitations). 180 tablet 3   Multiple Vitamins-Minerals (PRESERVISION AREDS 2) CAPS Take 1 capsule by mouth 2 (two) times daily.     pantoprazole (PROTONIX) 40 MG tablet TAKE 1 TABLET BY MOUTH EVERY DAY (Patient taking differently: Take 40 mg by mouth every morning.) 90 tablet 3   promethazine (PHENERGAN) 25 MG tablet Take 1 tablet (25 mg total) by mouth every 8 (eight) hours as needed for nausea or vomiting. 20 tablet 0   Respiratory Therapy Supplies (FLUTTER) DEVI 1 Device by Does not apply route as directed. 1 each 0   No current facility-administered medications on file prior to visit.   Allergies  Allergen Reactions   Crestor [Rosuvastatin Calcium] Other (See Comments)    Joint pain, effected liver function    Zetia [Ezetimibe] Other (See Comments)    Joint pain, effected liver function    Social History   Socioeconomic History   Marital status: Married    Spouse name: Not on file   Number of children: Not on file   Years of education: Not on file   Highest education level:  Not on file  Occupational History   Not on file  Tobacco Use   Smoking status: Never   Smokeless tobacco: Never  Vaping Use   Vaping Use: Never used  Substance and Sexual Activity   Alcohol use: No   Drug use: No   Sexual activity: Not on file  Other Topics Concern   Not on file  Social History Narrative   Not on file   Social Determinants of Health   Financial Resource Strain: Not on file  Food Insecurity: Not on file  Transportation Needs: Not on file  Physical Activity: Not on file  Stress: Not on file  Social Connections: Not on file  Intimate Partner Violence: Not on file       Review of Systems  All other systems reviewed and are negative.     Objective:   Physical Exam Vitals reviewed.  Constitutional:      General: She is not in acute distress.    Appearance: Normal appearance. She is normal weight. She is not ill-appearing or toxic-appearing.  HENT:     Mouth/Throat:     Mouth: Mucous membranes are moist.  Eyes:     Conjunctiva/sclera: Conjunctivae normal.     Pupils: Pupils are equal, round, and reactive to light.  Cardiovascular:     Rate and Rhythm: Normal rate and regular rhythm.     Pulses: Normal pulses.     Heart sounds: Normal heart sounds. No murmur heard.   No friction rub. No gallop.  Pulmonary:     Effort: Pulmonary effort is normal. No respiratory distress.     Breath sounds: Normal breath sounds. No stridor.  Abdominal:     General: Abdomen is flat. Bowel sounds are normal. There is no distension.     Tenderness: There is no abdominal tenderness. There is no guarding.  Musculoskeletal:     Right lower leg: No edema.     Left lower leg: No edema.  Skin:    Findings: No rash.  Neurological:     General: No focal deficit present.     Mental Status: She is alert. Mental status is at baseline.     Cranial Nerves: No cranial nerve deficit.     Sensory: No sensory deficit.     Motor: No weakness.     Coordination: Coordination  normal.     Gait: Gait normal.          Assessment & Plan:   Stage 3b chronic kidney disease ([place]) - Plan: CBC with Differential/Platelet, COMPLETE METABOLIC PANEL WITH GFR, Magnesium  Paroxysmal atrial fibrillation ([place]) - Plan: CBC with Differential/Platelet, COMPLETE METABOLIC PANEL WITH GFR, Magnesium  Hyponatremia - Plan: CBC with Differential/Platelet, COMPLETE METABOLIC PANEL WITH GFR, Magnesium  Hypomagnesemia - Plan: CBC with Differential/Platelet, COMPLETE METABOLIC PANEL WITH GFR, Magnesium Patient has paroxysmal atrial fibrillation.  I believe this most recent episode was brought on by severe dehydration, hyponatremia, and hypokalemia.  However this is the second episode of A. fib the patient has had.  Therefore we have discussed whether we be better for her to switch from Plavix to Xarelto for secondary prevention of stroke.  She does have a history of stroke seen on an MRI.  I believe that she would be better off on Xarelto and based on her kidney function, she would benefit from taking 15 mg once a day.  I will repeat a CBC to ensure that hemoglobin is not trending down further and if hemoglobin is stable I plan to switch from Plavix to Xarelto for prevention of stroke.  Regarding her paroxysmal atrial fibrillation, her blood pressure at home has been averaging between 110 and 120/60-80.  Her heart rate has consistently been 58-68.  Her daughter is doing a fantastic job checking her heart rate and blood pressure.  Continue to take metoprolol every day but hold metoprolol if heart rate is less than 60.  Monitor kidney function, potassium, and magnesium to determine if she needs additional repletion.  Encouraged 64 oz (2000 mL) of fluid a day

## 2020-09-15 NOTE — Telephone Encounter (Signed)
Moving forward, patient wants daughter Levada Dy contacted to give all test results. Please advise [phone].

## 2020-09-17 ENCOUNTER — Other Ambulatory Visit: Payer: Self-pay | Admitting: *Deleted

## 2020-09-17 MED ORDER — RIVAROXABAN 15 MG PO TABS: 15.0000 mg | ORAL_TABLET | Freq: Every day | ORAL | 0 refills | Status: DC

## 2020-09-18 LAB — CBC WITH DIFFERENTIAL/PLATELET
Absolute Monocytes: 561 cells/uL (ref 200–950)
Basophils Absolute: 43 cells/uL (ref 0–200)
Basophils Relative: 0.6 %
Eosinophils Absolute: 312 cells/uL (ref 15–500)
Eosinophils Relative: 4.4 %
HCT: 30.8 % — ABNORMAL LOW (ref 35.0–45.0)
Hemoglobin: 10.4 g/dL — ABNORMAL LOW (ref 11.7–15.5)
Lymphs Abs: 1058 cells/uL (ref 850–3900)
MCH: 30.5 pg (ref 27.0–33.0)
MCHC: 33.8 g/dL (ref 32.0–36.0)
MCV: 90.3 fL (ref 80.0–100.0)
MPV: 9.8 fL (ref 7.5–12.5)
Monocytes Relative: 7.9 %
Neutro Abs: 5126 cells/uL (ref 1500–7800)
Neutrophils Relative %: 72.2 %
Platelets: 304 10*3/uL (ref 140–400)
RBC: 3.41 10*6/uL — ABNORMAL LOW (ref 3.80–5.10)
RDW: 12.6 % (ref 11.0–15.0)
Total Lymphocyte: 14.9 %
WBC: 7.1 10*3/uL (ref 3.8–10.8)

## 2020-09-18 LAB — TEST AUTHORIZATION

## 2020-09-18 LAB — COMPLETE METABOLIC PANEL WITH GFR
AG Ratio: 1.2 (calc) (ref 1.0–2.5)
ALT: 18 U/L (ref 6–29)
AST: 24 U/L (ref 10–35)
Albumin: 4 g/dL (ref 3.6–5.1)
Alkaline phosphatase (APISO): 235 U/L — ABNORMAL HIGH (ref 37–153)
BUN/Creatinine Ratio: 16 (calc) (ref 6–22)
BUN: 32 mg/dL — ABNORMAL HIGH (ref 7–25)
CO2: 27 mmol/L (ref 20–32)
Calcium: 9.3 mg/dL (ref 8.6–10.4)
Chloride: 99 mmol/L (ref 98–110)
Creat: 2 mg/dL — ABNORMAL HIGH (ref 0.60–0.95)
Globulin: 3.3 g/dL (calc) (ref 1.9–3.7)
Glucose, Bld: 99 mg/dL (ref 65–99)
Potassium: 5 mmol/L (ref 3.5–5.3)
Sodium: 132 mmol/L — ABNORMAL LOW (ref 135–146)
Total Bilirubin: 0.6 mg/dL (ref 0.2–1.2)
Total Protein: 7.3 g/dL (ref 6.1–8.1)
eGFR: 24 mL/min/{1.73_m2} — ABNORMAL LOW (ref >=60)

## 2020-09-18 LAB — MAGNESIUM: Magnesium: 1.5 mg/dL (ref 1.5–2.5)

## 2020-09-18 LAB — VITAMIN D 25 HYDROXY (VIT D DEFICIENCY, FRACTURES): Vit D, 25-Hydroxy: 57 ng/mL (ref 30–100)

## 2020-09-18 LAB — GAMMA GT: GGT: 207 U/L — ABNORMAL HIGH (ref 3–65)

## 2020-09-19 ENCOUNTER — Other Ambulatory Visit: Payer: Self-pay | Admitting: *Deleted

## 2020-09-19 DIAGNOSIS — R7989 Other specified abnormal findings of blood chemistry: Secondary | ICD-10-CM

## 2020-09-25 ENCOUNTER — Other Ambulatory Visit: Payer: Medicare Other

## 2020-09-25 ENCOUNTER — Other Ambulatory Visit: Payer: Self-pay

## 2020-09-25 DIAGNOSIS — R7989 Other specified abnormal findings of blood chemistry: Secondary | ICD-10-CM

## 2020-09-25 LAB — COMPLETE METABOLIC PANEL WITH GFR
AG Ratio: 1.3 (calc) (ref 1.0–2.5)
ALT: 12 U/L (ref 6–29)
AST: 20 U/L (ref 10–35)
Albumin: 4 g/dL (ref 3.6–5.1)
Alkaline phosphatase (APISO): 145 U/L (ref 37–153)
BUN/Creatinine Ratio: 13 (calc) (ref 6–22)
BUN: 26 mg/dL — ABNORMAL HIGH (ref 7–25)
CO2: 28 mmol/L (ref 20–32)
Calcium: 9.2 mg/dL (ref 8.6–10.4)
Chloride: 96 mmol/L — ABNORMAL LOW (ref 98–110)
Creat: 1.96 mg/dL — ABNORMAL HIGH (ref 0.60–0.95)
Globulin: 3.2 g/dL (calc) (ref 1.9–3.7)
Glucose, Bld: 99 mg/dL (ref 65–99)
Potassium: 4.3 mmol/L (ref 3.5–5.3)
Sodium: 133 mmol/L — ABNORMAL LOW (ref 135–146)
Total Bilirubin: 0.8 mg/dL (ref 0.2–1.2)
Total Protein: 7.2 g/dL (ref 6.1–8.1)
eGFR: 25 mL/min/{1.73_m2} — ABNORMAL LOW (ref >=60)

## 2020-10-09 ENCOUNTER — Other Ambulatory Visit: Payer: Self-pay

## 2020-10-09 ENCOUNTER — Ambulatory Visit: Payer: Medicare Other | Admitting: Nurse Practitioner

## 2020-10-09 ENCOUNTER — Encounter: Payer: Self-pay | Admitting: Nurse Practitioner

## 2020-10-09 ENCOUNTER — Ambulatory Visit: Payer: Medicare Other

## 2020-10-09 VITALS — BP 148/82 | HR 120 | Ht 66.0 in | Wt 143.4 lb

## 2020-10-09 VITALS — BP 118/62 | HR 107

## 2020-10-09 DIAGNOSIS — I4819 Other persistent atrial fibrillation: Secondary | ICD-10-CM

## 2020-10-09 DIAGNOSIS — R009 Unspecified abnormalities of heart beat: Secondary | ICD-10-CM

## 2020-10-09 NOTE — Progress Notes (Signed)
Pt accompanied with daughter, Levada Dy. Daughter asks for pt's BP and HR to be checked. Levada Dy states pt had elevated HR at home.  Offered appointment with Janett Billow today at 3 pm. Daughter declines at this time. Victoriano Lain and patient to continue medications as ordered, call if elevated HR persists, if pt has issues with SOB or chest pain. Pt and daughter verbalized understanding of all. Mjp,lpn

## 2020-10-09 NOTE — Progress Notes (Signed)
Subjective:    Patient ID: Jocelyn Moss, female    DOB: 1935/02/01, 85 y.o.   MRN: [id number]  HPI: Jocelyn Moss is a 85 y.o. female presenting for elevated heart rate.  Chief Complaint  Patient presents with   Hypertension    Blood pressure have been running high this afternoon , pt is taking medication as given , no changes this started yesterday, heart rate is fluctuating also   PALPITATIONS Not feeling bad, takes HR and BP daily.  Reports today, her HR was "jumping all over the place."  She does have a history of A-fib.  She was recently started back on xarelto 15 mg daily, has had some bleeding around her ostomy from this.  She is currently taking metoprolol 12.5 mg twice daily.  Duration: days Symptom description: asymptomatic Duration of episode: asymptomatic Frequency: history of same Activity when event occurred: rest Related to exertion: no Dyspnea: no Chest pain: no Syncope: no Anxiety/stress: no Nausea/vomiting: no Diaphoresis: no Arrhythmia:yes Thyroid disease: yes Status:  stable Treatments attempted: nothing  Allergies  Allergen Reactions   Crestor [Rosuvastatin Calcium] Other (See Comments)    Joint pain, effected liver function    Zetia [Ezetimibe] Other (See Comments)    Joint pain, effected liver function     Outpatient Encounter Medications as of 10/09/2020  Medication Sig Note   acetaminophen (TYLENOL) 500 MG tablet Take 500 mg by mouth every 6 (six) hours as needed (pain).    atorvastatin (LIPITOR) 20 MG tablet TAKE 1 TABLET BY MOUTH EVERY DAY (Patient taking differently: Take 20 mg by mouth every morning.)    atorvastatin (LIPITOR) 20 MG tablet Take by mouth.    Calcium Carb-Cholecalciferol (CALCIUM 600+D) 600-800 MG-UNIT TABS Take 1 tablet by mouth 2 (two) times daily.    calcium carbonate (OSCAL) 1500 (600 Ca) MG TABS tablet Take by mouth.    clopidogrel (PLAVIX) 75 MG tablet Take by mouth.    DULERA 100-5 MCG/ACT AERO USE 2  INHALATIONS ORALLY   TWICE DAILY (Patient taking differently: Inhale 2 puffs into the lungs at bedtime.)    fluticasone (FLONASE) 50 MCG/ACT nasal spray Place 2 sprays into both nostrils daily.    fluticasone (FLONASE) 50 MCG/ACT nasal spray Place into the nose.    levothyroxine (SYNTHROID) 100 MCG tablet Take 1 tablet (100 mcg total) by mouth every other day. 09/05/2020: yellow   levothyroxine (SYNTHROID) 112 MCG tablet Take 1 tablet (112 mcg total) by mouth every other day. 09/05/2020: pink   metoprolol tartrate (LOPRESSOR) 25 MG tablet Take 0.5 tablets (12.5 mg total) by mouth 2 (two) times daily as needed (palpitations).    MULTIPLE MINERALS-VITAMINS PO Take by mouth.    Multiple Vitamins-Minerals (PRESERVISION AREDS 2) CAPS Take 1 capsule by mouth 2 (two) times daily.    pantoprazole (PROTONIX) 40 MG tablet TAKE 1 TABLET BY MOUTH EVERY DAY (Patient taking differently: Take 40 mg by mouth every morning.)    pantoprazole (PROTONIX) 40 MG tablet Take by mouth.    promethazine (PHENERGAN) 25 MG tablet Take 1 tablet (25 mg total) by mouth every 8 (eight) hours as needed for nausea or vomiting.    Respiratory Therapy Supplies (FLUTTER) DEVI 1 Device by Does not apply route as directed.    Rivaroxaban (XARELTO) 15 MG TABS tablet Take 1 tablet (15 mg total) by mouth daily with supper.    Zoster Vaccine Adjuvanted [facility]) injection Shingrix (PF) 50 mcg/0.5 mL intramuscular suspension, kit    No  facility-administered encounter medications on file as of 10/09/2020.    Patient Active Problem List   Diagnosis Date Noted   Elevated heart rate with elevated blood pressure without diagnosis of hypertension 10/09/2020   Acute kidney failure, unspecified ([place]) 09/11/2020   Other abnormal findings in urine 09/11/2020   Proteinuria, unspecified 09/11/2020   Stage 3b chronic kidney disease ([place]) 09/11/2020   Ileostomy in place [facility]) 09/06/2020   Mild intermittent asthma, uncomplicated 24/58/0998    Hyponatremia 09/06/2020   Hypokalemia 09/06/2020   Hypomagnesemia 09/06/2020   Acute cystitis without hematuria 09/06/2020   Syncope 09/05/2020   Renal cell carcinoma ([place])    Renal mass 02/01/2018   AKI (acute kidney injury) ([place]) 02/06/2016   Orthostasis 02/06/2016   Dehydration 02/06/2016   Diarrhea 02/06/2016   Syncope due to orthostatic hypotension 02/06/2016   PTTD (posterior tibial tendon dysfunction) 10/21/2015   Vitamin D deficiency 09/03/2015   History of malignant neoplasm of breast 12/18/2014   Malignant tumor of colon ([place]) 12/18/2014   Osteopenia 03/15/2012   MVP (mitral valve prolapse)    Hypercholesterolemia    History of colon cancer    Hypothyroidism    Chest pain    Atrial fibrillation with rapid ventricular response (HCC)    SOB (shortness of breath)    Breast cancer ([place])    COUGH 02/21/2009   PULMONARY COLLAPSE 12/24/2008   SWELLING, MASS, OR LUMP IN CHEST 12/24/2008   HYPOTHYROIDISM 12/23/2008   Mixed hyperlipidemia 12/23/2008    Past Medical History:  Diagnosis Date   Atrial fibrillation ([place])    Breast cancer ([place]) 1990   Chest pain    Colon cancer ([place])    ileostomy 2000   Dysrhythmia    History of colon cancer    Hypercholesterolemia    Hypothyroidism    Multiple lacunar infarcts (HCC)    MVP (mitral valve prolapse)    Osteoporosis    osteopenia   Personal history of chemotherapy    6 months   Personal history of radiation therapy    6 months   Renal cell carcinoma ([place])    stage 1 s/p resection Dr. Manny 2020   SOB (shortness of breath)     Relevant past medical, surgical, family and social history reviewed and updated as indicated. Interim medical history since our last visit reviewed.  Review of Systems Per HPI unless specifically indicated above     Objective:    BP (!) 148/82 (BP Location: Left Arm, Patient Position: Sitting, Cuff Size: Normal)   Pulse (!) 120   Ht _0  (1.676 m)   Wt 143 lb 6.4 oz (65 kg)   SpO2 97%    BMI 23.15 kg/m   Wt Readings from Last 3 Encounters:  10/09/20 143 lb 6.4 oz (65 kg)  09/15/20 141 lb (64 kg)  09/05/20 142 lb (64.4 kg)    Physical Exam Vitals and nursing note reviewed.  Constitutional:      General: She is not in acute distress.    Appearance: Normal appearance. She is not toxic-appearing.  Cardiovascular:     Rate and Rhythm: Normal rate. Rhythm irregular.  Pulmonary:     Effort: Pulmonary effort is normal. No respiratory distress.     Breath sounds: Normal breath sounds. No wheezing, rhonchi or rales.  Skin:    General: Skin is warm and dry.     Coloration: Skin is not jaundiced or pale.     Findings: No erythema.  Neurological:  Mental Status: She is alert and oriented to person, place, and time.     Motor: No weakness.     Gait: Gait normal.  Psychiatric:        Mood and Affect: Mood normal.        Behavior: Behavior normal.        Thought Content: Thought content normal.        Judgment: Judgment normal.      Assessment & Plan:  1. Persistent atrial fibrillation (HCC) EKG confirms patient is back in Atrial Fibrillation.  Heart rate is speeding up and slowly down quickly during exam.  Plan to increase metoprolol to 25 mg twice daily.  Discussed with patient and daughter at length today.  If radial HR is less than 50, we will hold dose of metoprolol.  If radial HR is greater than 60, will given medication.  Continue Xarelto as long as bleeding is not significant from ostomy.  Suspect some oozing related to dressing changes/bandages around stoma site.  Follow up in 2-4 weeks to reassess.   - EKG 12-Lead    Follow up plan: Return for 2-4 weeks recheck with PCP.

## 2020-10-16 ENCOUNTER — Other Ambulatory Visit: Payer: Self-pay | Admitting: Family Medicine

## 2020-10-17 ENCOUNTER — Telehealth: Payer: Self-pay

## 2020-10-17 MED ORDER — RIVAROXABAN 15 MG PO TABS: 15.0000 mg | ORAL_TABLET | Freq: Every day | ORAL | 0 refills | Status: DC

## 2020-10-17 NOTE — Telephone Encounter (Signed)
Prescription sent to retial pharmacy. Also given samples in case.   Also states that pulse rate noted 90- 110. States that after taking Toprol PR returns to 60's.   Advised that medication is working to reduce HR. Patient daughter is concerned about metabolic issues throwing off her AFib. Also concerned that anxiety is causing spike to HR.   Patient has F/U on 10/24/2020. Advised to discuss concerns with PCP at that time.

## 2020-10-17 NOTE — Telephone Encounter (Signed)
[name] called in stating that pt is down to 4 tablets of Rivaroxaban (XARELTO) 15 MG TABS, and the pharmacy that pt uses is currently out of this med. [name] also stated pt's pusle rate has been a little elevated and wanted to know if this is from the increase of pt's med metoprolol tartrate (LOPRESSOR). Please advise,  Cb#: [phone]

## 2020-10-24 ENCOUNTER — Other Ambulatory Visit: Payer: Self-pay

## 2020-10-24 ENCOUNTER — Ambulatory Visit: Payer: Medicare Other | Admitting: Family Medicine

## 2020-10-24 VITALS — BP 110/60 | HR 63 | Ht 66.0 in | Wt 145.0 lb

## 2020-10-24 DIAGNOSIS — Z23 Encounter for immunization: Secondary | ICD-10-CM | POA: Diagnosis not present

## 2020-10-24 DIAGNOSIS — I4819 Other persistent atrial fibrillation: Secondary | ICD-10-CM | POA: Diagnosis not present

## 2020-10-24 DIAGNOSIS — N1832 Chronic kidney disease, stage 3b: Secondary | ICD-10-CM

## 2020-10-24 DIAGNOSIS — E871 Hypo-osmolality and hyponatremia: Secondary | ICD-10-CM

## 2020-10-24 NOTE — Progress Notes (Signed)
Subjective:    Patient ID: Jocelyn Moss, female    DOB: [birth year], 85 y.o.   MRN: [id number]  HPI Patient is a very sweet 85 year old Caucasian female.  She is here today for follow-up.  Today she is in atrial fibrillation so at this point it seems like this is persistent now.  Her daughter has been doing an Media planner job of checking her heart rate and recording this for [place].  Her heart rates are typically between 50 and 60 bpm.  90% of her heart rates remain in this level.  Occasionally she will have a heart rate above 100 but the vast majority are well controlled below that.  Her blood pressures typically running [phone] systolic over 23-95 diastolic.  The patient denies any chest pain or shortness of breath.  She denies any syncope or near syncope.  She denies any further bleeding.  She is back on Xarelto and tolerating it well Past Medical History:  Diagnosis Date   Atrial fibrillation ([place])    Breast cancer ([place]) 1990   Chest pain    Colon cancer ([place])    ileostomy 2000   Dysrhythmia    History of colon cancer    Hypercholesterolemia    Hypothyroidism    Multiple lacunar infarcts (HCC)    MVP (mitral valve prolapse)    Osteoporosis    osteopenia   Personal history of chemotherapy    6 months   Personal history of radiation therapy    6 months   Renal cell carcinoma (HCC)    stage 1 s/p resection Dr. Tresa Moore 2020   SOB (shortness of breath)    Past Surgical History:  Procedure Laterality Date   BREAST BIOPSY     BREAST LUMPECTOMY Left    COLON SURGERY  2000   ileostomy   ILEOSTOMY     ROBOTIC ASSITED PARTIAL NEPHRECTOMY Right 02/01/2018   Procedure: XI ROBOTIC ASSISTED RETROPERITONEAL PARTIAL NEPHRECTOMY;  Surgeon: Alexis Frock, MD;  Location: [facility];  Service: Urology;  Laterality: Right;   Current Outpatient Medications on File Prior to Visit  Medication Sig Dispense Refill   acetaminophen (TYLENOL) 500 MG tablet Take 500 mg by mouth every 6 (six) hours as needed  (pain).     atorvastatin (LIPITOR) 20 MG tablet Take by mouth.     Calcium Carb-Cholecalciferol (CALCIUM 600+D) 600-800 MG-UNIT TABS Take 1 tablet by mouth 2 (two) times daily.     calcium carbonate (OSCAL) 1500 (600 Ca) MG TABS tablet Take by mouth.     DULERA 100-5 MCG/ACT AERO USE 2 INHALATIONS ORALLY   TWICE DAILY (Patient taking differently: Inhale 2 puffs into the lungs at bedtime.) 39 g 3   fluticasone (FLONASE) 50 MCG/ACT nasal spray Place 2 sprays into both nostrils daily. 16 g 6   fluticasone (FLONASE) 50 MCG/ACT nasal spray Place into the nose.     levothyroxine (SYNTHROID) 100 MCG tablet Take 1 tablet (100 mcg total) by mouth every other day. 45 tablet 3   levothyroxine (SYNTHROID) 112 MCG tablet Take 1 tablet (112 mcg total) by mouth every other day. 45 tablet 3   metoprolol tartrate (LOPRESSOR) 25 MG tablet Take 0.5 tablets (12.5 mg total) by mouth 2 (two) times daily as needed (palpitations). 180 tablet 3   MULTIPLE MINERALS-VITAMINS PO Take by mouth.     Multiple Vitamins-Minerals (PRESERVISION AREDS 2) CAPS Take 1 capsule by mouth 2 (two) times daily.     pantoprazole (PROTONIX) 40 MG tablet TAKE 1 TABLET  BY MOUTH EVERY DAY (Patient taking differently: Take 40 mg by mouth every morning.) 90 tablet 3   pantoprazole (PROTONIX) 40 MG tablet Take by mouth.     promethazine (PHENERGAN) 25 MG tablet Take 1 tablet (25 mg total) by mouth every 8 (eight) hours as needed for nausea or vomiting. 20 tablet 0   Respiratory Therapy Supplies (FLUTTER) DEVI 1 Device by Does not apply route as directed. 1 each 0   Rivaroxaban (XARELTO) 15 MG TABS tablet Take 1 tablet (15 mg total) by mouth daily with supper. 30 tablet 0   XARELTO 15 MG TABS tablet TAKE 1 TABLET (15 MG TOTAL) BY MOUTH DAILY WITH SUPPER. 30 tablet 0   Zoster Vaccine Adjuvanted [facility]) injection Shingrix (PF) 50 mcg/0.5 mL intramuscular suspension, kit     No current facility-administered medications on file prior to visit.    Allergies  Allergen Reactions   Crestor [Rosuvastatin Calcium] Other (See Comments)    Joint pain, effected liver function    Zetia [Ezetimibe] Other (See Comments)    Joint pain, effected liver function    Social History   Socioeconomic History   Marital status: Married    Spouse name: Not on file   Number of children: Not on file   Years of education: Not on file   Highest education level: Not on file  Occupational History   Not on file  Tobacco Use   Smoking status: Never   Smokeless tobacco: Never  Vaping Use   Vaping Use: Never used  Substance and Sexual Activity   Alcohol use: No   Drug use: No   Sexual activity: Not on file  Other Topics Concern   Not on file  Social History Narrative   Not on file   Social Determinants of Health   Financial Resource Strain: Not on file  Food Insecurity: Not on file  Transportation Needs: Not on file  Physical Activity: Not on file  Stress: Not on file  Social Connections: Not on file  Intimate Partner Violence: Not on file       Review of Systems  All other systems reviewed and are negative.     Objective:   Physical Exam Vitals reviewed.  Constitutional:      General: She is not in acute distress.    Appearance: Normal appearance. She is normal weight. She is not ill-appearing or toxic-appearing.  HENT:     Mouth/Throat:     Mouth: Mucous membranes are moist.  Eyes:     Conjunctiva/sclera: Conjunctivae normal.     Pupils: Pupils are equal, round, and reactive to light.  Cardiovascular:     Rate and Rhythm: Normal rate and regular rhythm.     Pulses: Normal pulses.     Heart sounds: Normal heart sounds. No murmur heard.   No friction rub. No gallop.  Pulmonary:     Effort: Pulmonary effort is normal. No respiratory distress.     Breath sounds: Normal breath sounds. No stridor.  Abdominal:     General: Abdomen is flat. Bowel sounds are normal. There is no distension.     Tenderness: There is no  abdominal tenderness. There is no guarding.  Musculoskeletal:     Right lower leg: No edema.     Left lower leg: No edema.  Skin:    Findings: No rash.  Neurological:     General: No focal deficit present.     Mental Status: She is alert. Mental status is at baseline.  Cranial Nerves: No cranial nerve deficit.     Sensory: No sensory deficit.     Motor: No weakness.     Coordination: Coordination normal.     Gait: Gait normal.          Assessment & Plan:   Persistent atrial fibrillation ([place]) - Plan: CBC with Differential/Platelet, COMPLETE METABOLIC PANEL WITH GFR, TSH  Stage 3b chronic kidney disease ([place])  Hyponatremia  Encounter for immunization - Plan: Flu Vaccine QUAD High Dose(Fluad) Continue to encourage the patient to drink 64 ounces of fluid a day to maintain hydration.  Blood pressure is well controlled and heart rate seems to be fairly consistent in the 50s to 60s.  Continue metoprolol 1/2 to 1 tablet twice daily.  She is taking a whole tablet twice daily as long as her heart rate is above 60.  If it is in the 50s she only takes a half a tablet.  Hemoglobin was just checked yesterday at her nephrologist.  I will asked the family to get a copy of the records to ensure that her hemoglobin remains above 10 on Xarelto.  I will defer monitoring her creatinine and potassium to the nephrologist.

## 2020-10-30 DIAGNOSIS — I4819 Other persistent atrial fibrillation: Secondary | ICD-10-CM | POA: Insufficient documentation

## 2020-10-30 DIAGNOSIS — D631 Anemia in chronic kidney disease: Secondary | ICD-10-CM | POA: Insufficient documentation

## 2020-10-30 DIAGNOSIS — Z905 Acquired absence of kidney: Secondary | ICD-10-CM | POA: Insufficient documentation

## 2020-10-30 DIAGNOSIS — C641 Malignant neoplasm of right kidney, except renal pelvis: Secondary | ICD-10-CM | POA: Insufficient documentation

## 2020-10-30 DIAGNOSIS — I10 Essential (primary) hypertension: Secondary | ICD-10-CM | POA: Insufficient documentation

## 2020-11-12 ENCOUNTER — Encounter: Payer: Self-pay | Admitting: Family Medicine

## 2020-11-21 ENCOUNTER — Other Ambulatory Visit: Payer: Self-pay

## 2020-11-21 DIAGNOSIS — Z8673 Personal history of transient ischemic attack (TIA), and cerebral infarction without residual deficits: Secondary | ICD-10-CM

## 2020-11-21 MED ORDER — RIVAROXABAN 15 MG PO TABS: 15.0000 mg | ORAL_TABLET | Freq: Every day | ORAL | 5 refills | Status: DC

## 2020-11-21 MED ORDER — METOPROLOL TARTRATE 25 MG PO TABS: 12.5000 mg | ORAL_TABLET | Freq: Two times a day (BID) | ORAL | 3 refills | Status: DC | PRN

## 2020-11-21 MED ORDER — RIVAROXABAN 15 MG PO TABS: 15.0000 mg | ORAL_TABLET | Freq: Every day | ORAL | 0 refills | Status: DC

## 2020-11-21 NOTE — Telephone Encounter (Signed)
Spoke with pt's daughter, Levada Dy ([place]), and advised of refills and need to continue meds as directed. She will let her mother know. Rx for Xarelto updated to include refills as original did not have any. Nothing further needed at this time.

## 2020-11-21 NOTE — Telephone Encounter (Signed)
Pt called to inquire about refills on her Xarelto and Metoprolol. Pt is unsure if she should still be taking these or if they were short-term. Pt reports HR has been in 60's, occasional A-Fib episodes but seems to have improved. Pt reports no bleeding or other complications at ostomy site.   Please advise. Pt would like return call with instructions.    Thanks! Jaydee Ingman, CMA(AAMA)

## 2020-11-21 NOTE — Addendum Note (Signed)
Addended by: Wadie Lessen on: 11/21/2020 02:36 PM   Modules accepted: Orders

## 2020-11-26 ENCOUNTER — Other Ambulatory Visit: Payer: Self-pay

## 2020-11-26 ENCOUNTER — Other Ambulatory Visit: Payer: Self-pay | Admitting: Family Medicine

## 2020-12-03 ENCOUNTER — Telehealth: Payer: Self-pay | Admitting: Family Medicine

## 2020-12-03 NOTE — Telephone Encounter (Signed)
I spoke to patient's daughter and she said she would speak to patient and call back and schedule Medicare Annual Wellness Visit (AWV) in office.   If not able to come in office, please offer to do virtually or by telephone.  Left office number and my jabber [phone].  Last AWV:01/30/2019  Please schedule at anytime with Nurse Health Advisor.

## 2020-12-17 ENCOUNTER — Telehealth: Payer: Self-pay

## 2020-12-17 ENCOUNTER — Telehealth: Payer: Medicare Other | Admitting: Nurse Practitioner

## 2020-12-17 NOTE — Progress Notes (Deleted)
Subjective:    Patient ID: Jocelyn Moss, female    DOB: 1935/06/24, 85 y.o.   MRN: [id number]  HPI: Jocelyn Moss is a 85 y.o. female presenting for  No chief complaint on file.   Allergies  Allergen Reactions   Crestor [Rosuvastatin Calcium] Other (See Comments)    Joint pain, effected liver function    Zetia [Ezetimibe] Other (See Comments)    Joint pain, effected liver function     Outpatient Encounter Medications as of 12/17/2020  Medication Sig Note   acetaminophen (TYLENOL) 500 MG tablet Take 500 mg by mouth every 6 (six) hours as needed (pain).    atorvastatin (LIPITOR) 20 MG tablet TAKE 1 TABLET BY MOUTH EVERY DAY    Calcium Carb-Cholecalciferol (CALCIUM 600+D) 600-800 MG-UNIT TABS Take 1 tablet by mouth 2 (two) times daily.    calcium carbonate (OSCAL) 1500 (600 Ca) MG TABS tablet Take by mouth.    clopidogrel (PLAVIX) 75 MG tablet Take 1 tablet by mouth daily.    DULERA 100-5 MCG/ACT AERO USE 2 INHALATIONS ORALLY   TWICE DAILY (Patient taking differently: Inhale 2 puffs into the lungs at bedtime.)    ferrous gluconate (FERGON) 324 MG tablet Take 324 mg by mouth 2 (two) times daily.    fluticasone (FLONASE) 50 MCG/ACT nasal spray Place 2 sprays into both nostrils daily.    fluticasone (FLONASE) 50 MCG/ACT nasal spray Place into the nose.    levothyroxine (SYNTHROID) 100 MCG tablet Take 1 tablet (100 mcg total) by mouth every other day. 09/05/2020: yellow   levothyroxine (SYNTHROID) 112 MCG tablet Take 1 tablet (112 mcg total) by mouth every other day. 09/05/2020: pink   metoprolol tartrate (LOPRESSOR) 25 MG tablet Take 0.5 tablets (12.5 mg total) by mouth 2 (two) times daily as needed (palpitations).    MULTIPLE MINERALS-VITAMINS PO Take by mouth.    Multiple Vitamins-Minerals (PRESERVISION AREDS 2) CAPS Take 1 capsule by mouth 2 (two) times daily.    pantoprazole (PROTONIX) 40 MG tablet TAKE 1 TABLET BY MOUTH EVERY DAY (Patient taking differently: Take 40 mg by  mouth every morning.)    promethazine (PHENERGAN) 25 MG tablet Take 1 tablet (25 mg total) by mouth every 8 (eight) hours as needed for nausea or vomiting. (Patient not taking: Reported on 10/24/2020)    Respiratory Therapy Supplies (FLUTTER) DEVI 1 Device by Does not apply route as directed.    Rivaroxaban (XARELTO) 15 MG TABS tablet Take 1 tablet (15 mg total) by mouth daily with supper.    Zoster Vaccine Adjuvanted [facility]) injection Shingrix (PF) 50 mcg/0.5 mL intramuscular suspension, kit (Patient not taking: Reported on 10/24/2020)    No facility-administered encounter medications on file as of 12/17/2020.    Patient Active Problem List   Diagnosis Date Noted   Elevated heart rate with elevated blood pressure without diagnosis of hypertension 10/09/2020   Acute kidney failure, unspecified ([place]) 09/11/2020   Other abnormal findings in urine 09/11/2020   Proteinuria, unspecified 09/11/2020   Stage 3b chronic kidney disease ([place]) 09/11/2020   Ileostomy in place [facility]) 09/06/2020   Mild intermittent asthma, uncomplicated 16/38/4665   Hyponatremia 09/06/2020   Hypokalemia 09/06/2020   Hypomagnesemia 09/06/2020   Acute cystitis without hematuria 09/06/2020   Syncope 09/05/2020   Renal cell carcinoma ([place])    Renal mass 02/01/2018   AKI (acute kidney injury) ([place]) 02/06/2016   Orthostasis 02/06/2016   Dehydration 02/06/2016   Diarrhea 02/06/2016   Syncope due to orthostatic hypotension  02/06/2016   PTTD (posterior tibial tendon dysfunction) 10/21/2015   Vitamin D deficiency 09/03/2015   History of malignant neoplasm of breast 12/18/2014   Malignant tumor of colon ([place]) 12/18/2014   Osteopenia 03/15/2012   MVP (mitral valve prolapse)    Hypercholesterolemia    History of colon cancer    Hypothyroidism    Chest pain    Atrial fibrillation with rapid ventricular response (HCC)    SOB (shortness of breath)    Breast cancer ([place])    COUGH 02/21/2009   PULMONARY COLLAPSE  12/24/2008   SWELLING, MASS, OR LUMP IN CHEST 12/24/2008   HYPOTHYROIDISM 12/23/2008   Mixed hyperlipidemia 12/23/2008    Past Medical History:  Diagnosis Date   Atrial fibrillation ([place])    Breast cancer ([place]) 1990   Chest pain    Colon cancer ([place])    ileostomy 2000   Dysrhythmia    History of colon cancer    Hypercholesterolemia    Hypothyroidism    Multiple lacunar infarcts (HCC)    MVP (mitral valve prolapse)    Osteoporosis    osteopenia   Personal history of chemotherapy    6 months   Personal history of radiation therapy    6 months   Renal cell carcinoma (HCC)    stage 1 s/p resection Dr. Manny 2020   SOB (shortness of breath)     Relevant past medical, surgical, family and social history reviewed and updated as indicated. Interim medical history since our last visit reviewed.  Review of Systems Per HPI unless specifically indicated above     Objective:    There were no vitals taken for this visit.  Wt Readings from Last 3 Encounters:  10/24/20 145 lb (65.8 kg)  10/09/20 143 lb 6.4 oz (65 kg)  09/15/20 141 lb (64 kg)    Physical Exam  Results for orders placed or performed in visit on 09/25/20  COMPLETE METABOLIC PANEL WITH GFR  Result Value Ref Range   Glucose, Bld 99 65 - 99 mg/dL   BUN 26 (H) 7 - 25 mg/dL   Creat 1.96 (H) 0.60 - 0.95 mg/dL   eGFR 25 (L) > OR = 60 mL/min/1.68m2   BUN/Creatinine Ratio 13 6 - 22 (calc)   Sodium 133 (L) 135 - 146 mmol/L   Potassium 4.3 3.5 - 5.3 mmol/L   Chloride 96 (L) 98 - 110 mmol/L   CO2 28 20 - 32 mmol/L   Calcium 9.2 8.6 - 10.4 mg/dL   Total Protein 7.2 6.1 - 8.1 g/dL   Albumin 4.0 3.6 - 5.1 g/dL   Globulin 3.2 1.9 - 3.7 g/dL (calc)   AG Ratio 1.3 1.0 - 2.5 (calc)   Total Bilirubin 0.8 0.2 - 1.2 mg/dL   Alkaline phosphatase (APISO) 145 37 - 153 U/L   AST 20 10 - 35 U/L   ALT 12 6 - 29 U/L      Assessment & Plan:   Problem List Items Addressed This Visit   None    Follow up plan: No follow-ups  on file.

## 2020-12-17 NOTE — Telephone Encounter (Signed)
Pts daughter called in for a refill of Rivaroxaban (XARELTO) 15 MG TABS

## 2020-12-17 NOTE — Telephone Encounter (Signed)
Per chart rx sent 11/21/20 for #30 with 5 refills.   Spoke with pt's daughter and advised. She states pt has not received call from [facility] to say her rx is ready for pick up so she thought she didn't have any refills. Advised daughter to contact [facility] as it may be too soon for refills. Nothing further needed.

## 2020-12-25 ENCOUNTER — Ambulatory Visit: Payer: Medicare Other | Admitting: Family Medicine

## 2020-12-25 ENCOUNTER — Other Ambulatory Visit: Payer: Self-pay

## 2020-12-25 ENCOUNTER — Encounter: Payer: Self-pay | Admitting: Family Medicine

## 2020-12-25 VITALS — BP 118/72 | HR 59 | Resp 17 | Ht 66.0 in | Wt 145.0 lb

## 2020-12-25 DIAGNOSIS — J471 Bronchiectasis with (acute) exacerbation: Secondary | ICD-10-CM | POA: Diagnosis not present

## 2020-12-25 DIAGNOSIS — N1832 Chronic kidney disease, stage 3b: Secondary | ICD-10-CM

## 2020-12-25 DIAGNOSIS — I4819 Other persistent atrial fibrillation: Secondary | ICD-10-CM | POA: Diagnosis not present

## 2020-12-25 DIAGNOSIS — E039 Hypothyroidism, unspecified: Secondary | ICD-10-CM

## 2020-12-25 MED ORDER — DOXYCYCLINE HYCLATE 100 MG PO TABS
100.0000 mg | ORAL_TABLET | Freq: Two times a day (BID) | ORAL | 0 refills | Status: DC
Start: 2020-12-25 — End: 2021-09-24

## 2020-12-25 NOTE — Progress Notes (Signed)
Subjective:    Patient ID: Jocelyn Moss, female    DOB: 27-Dec-1935, 85 y.o.   MRN: [id number]  HPI  Patient is a very sweet 85 year old Caucasian female with a past medical history of atrial fibrillation, breast cancer, colon cancer status post colostomy, and renal cell carcinoma.  She also has stage IV chronic kidney disease.  However she saw her nephrologist in September and her creatinine in September on the 29th was down to 1.48.  Most recently at my office it had been 1.96.  She has been making a concerted effort to drink more fluids.  She is also doing her flutter valve for bronchiectasis on a daily basis.  Recently she was seen by her dermatologist to have a skin cancer removed from her forehead.  Prior to that she had been coughing and having a productive cough with purulent sputum.  They started her on a short course of doxycycline just for 5 days and the cough almost immediately resolved.  However has returned after stopping the doxycycline.  She is consistently taking her Dulera, Protonix, however she is not using her Flonase consistently.  She denies any pleurisy or hemoptysis or fever or chills. Past Medical History:  Diagnosis Date   Atrial fibrillation ([place])    Breast cancer ([place]) 1990   Chest pain    Colon cancer ([place])    ileostomy 2000   Dysrhythmia    History of colon cancer    Hypercholesterolemia    Hypothyroidism    Multiple lacunar infarcts (HCC)    MVP (mitral valve prolapse)    Osteoporosis    osteopenia   Personal history of chemotherapy    6 months   Personal history of radiation therapy    6 months   Renal cell carcinoma (HCC)    stage 1 s/p resection Dr. Tresa Moore 2020   SOB (shortness of breath)    Past Surgical History:  Procedure Laterality Date   BREAST BIOPSY     BREAST LUMPECTOMY Left    COLON SURGERY  2000   ileostomy   ILEOSTOMY     ROBOTIC ASSITED PARTIAL NEPHRECTOMY Right 02/01/2018   Procedure: XI ROBOTIC ASSISTED RETROPERITONEAL PARTIAL  NEPHRECTOMY;  Surgeon: Alexis Frock, MD;  Location: [facility];  Service: Urology;  Laterality: Right;   Current Outpatient Medications on File Prior to Visit  Medication Sig Dispense Refill   acetaminophen (TYLENOL) 500 MG tablet Take 500 mg by mouth every 6 (six) hours as needed (pain).     atorvastatin (LIPITOR) 20 MG tablet TAKE 1 TABLET BY MOUTH EVERY DAY 90 tablet 2   Calcium Carb-Cholecalciferol (CALCIUM 600+D) 600-800 MG-UNIT TABS Take 1 tablet by mouth 2 (two) times daily.     calcium carbonate (OSCAL) 1500 (600 Ca) MG TABS tablet Take by mouth.     clopidogrel (PLAVIX) 75 MG tablet Take 1 tablet by mouth daily.     DULERA 100-5 MCG/ACT AERO USE 2 INHALATIONS ORALLY   TWICE DAILY (Patient taking differently: Inhale 2 puffs into the lungs at bedtime.) 39 g 3   ferrous gluconate (FERGON) 324 MG tablet Take 324 mg by mouth 2 (two) times daily.     fluticasone (FLONASE) 50 MCG/ACT nasal spray Place 2 sprays into both nostrils daily. 16 g 6   fluticasone (FLONASE) 50 MCG/ACT nasal spray Place into the nose.     levothyroxine (SYNTHROID) 100 MCG tablet Take 1 tablet (100 mcg total) by mouth every other day. 45 tablet 3   levothyroxine (SYNTHROID)  112 MCG tablet Take 1 tablet (112 mcg total) by mouth every other day. 45 tablet 3   metoprolol tartrate (LOPRESSOR) 25 MG tablet Take 0.5 tablets (12.5 mg total) by mouth 2 (two) times daily as needed (palpitations). 180 tablet 3   MULTIPLE MINERALS-VITAMINS PO Take by mouth.     Multiple Vitamins-Minerals (PRESERVISION AREDS 2) CAPS Take 1 capsule by mouth 2 (two) times daily.     pantoprazole (PROTONIX) 40 MG tablet TAKE 1 TABLET BY MOUTH EVERY DAY (Patient taking differently: Take 40 mg by mouth every morning.) 90 tablet 3   promethazine (PHENERGAN) 25 MG tablet Take 1 tablet (25 mg total) by mouth every 8 (eight) hours as needed for nausea or vomiting. (Patient not taking: Reported on 10/24/2020) 20 tablet 0   Respiratory Therapy Supplies (FLUTTER)  DEVI 1 Device by Does not apply route as directed. 1 each 0   Rivaroxaban (XARELTO) 15 MG TABS tablet Take 1 tablet (15 mg total) by mouth daily with supper. 30 tablet 5   Zoster Vaccine Adjuvanted [facility]) injection Shingrix (PF) 50 mcg/0.5 mL intramuscular suspension, kit (Patient not taking: Reported on 10/24/2020)     No current facility-administered medications on file prior to visit.   Allergies  Allergen Reactions   Crestor [Rosuvastatin Calcium] Other (See Comments)    Joint pain, effected liver function    Zetia [Ezetimibe] Other (See Comments)    Joint pain, effected liver function    Social History   Socioeconomic History   Marital status: Married    Spouse name: Not on file   Number of children: Not on file   Years of education: Not on file   Highest education level: Not on file  Occupational History   Not on file  Tobacco Use   Smoking status: Never   Smokeless tobacco: Never  Vaping Use   Vaping Use: Never used  Substance and Sexual Activity   Alcohol use: No   Drug use: No   Sexual activity: Not on file  Other Topics Concern   Not on file  Social History Narrative   Not on file   Social Determinants of Health   Financial Resource Strain: Not on file  Food Insecurity: Not on file  Transportation Needs: Not on file  Physical Activity: Not on file  Stress: Not on file  Social Connections: Not on file  Intimate Partner Violence: Not on file       Review of Systems  All other systems reviewed and are negative.     Objective:   Physical Exam Vitals reviewed.  Constitutional:      General: She is not in acute distress.    Appearance: Normal appearance. She is normal weight. She is not ill-appearing or toxic-appearing.  HENT:     Mouth/Throat:     Mouth: Mucous membranes are moist.  Eyes:     Conjunctiva/sclera: Conjunctivae normal.     Pupils: Pupils are equal, round, and reactive to light.  Cardiovascular:     Rate and Rhythm: Normal  rate. Rhythm irregular.     Pulses: Normal pulses.     Heart sounds: Normal heart sounds. No murmur heard.   No friction rub. No gallop.  Pulmonary:     Effort: Pulmonary effort is normal. No respiratory distress.     Breath sounds: Normal breath sounds. No stridor.  Abdominal:     General: Abdomen is flat. Bowel sounds are normal. There is no distension.     Tenderness: There is no  abdominal tenderness. There is no guarding.  Musculoskeletal:     Right lower leg: No edema.     Left lower leg: No edema.  Skin:    Findings: No rash.  Neurological:     General: No focal deficit present.     Mental Status: She is alert. Mental status is at baseline.     Cranial Nerves: No cranial nerve deficit.     Sensory: No sensory deficit.     Motor: No weakness.     Coordination: Coordination normal.     Gait: Gait normal.          Assessment & Plan:  Hypothyroidism, unspecified type - Plan: CBC with Differential/Platelet, COMPLETE METABOLIC PANEL WITH GFR, TSH  Stage 3b chronic kidney disease (HCC)  Persistent atrial fibrillation (HCC)  Bronchiectasis with acute exacerbation ([place]) I believe she may have recently had an acute exacerbation of her bronchiectasis partially treated by the dermatologist with doxycycline.  I will extend the doxycycline for a total of 7 days to hopefully prevent rebound.  Continued to encourage her to be consistent with her flutter valve, Dulera, Protonix, and add Flonase if she is having any nasal congestion and drainage.  Otherwise she seems to be doing quite well.  She is making a concerted effort to drink more to avoid dehydration.  I will check a CMP to monitor her renal function, CBC to monitor her anemia, and a TSH to monitor her hypothyroidism.

## 2020-12-26 ENCOUNTER — Ambulatory Visit: Payer: Medicare Other | Admitting: Family Medicine

## 2020-12-26 ENCOUNTER — Other Ambulatory Visit: Payer: Self-pay

## 2020-12-26 LAB — COMPLETE METABOLIC PANEL WITH GFR
AG Ratio: 1.4 (calc) (ref 1.0–2.5)
ALT: 17 U/L (ref 6–29)
AST: 26 U/L (ref 10–35)
Albumin: 3.9 g/dL (ref 3.6–5.1)
Alkaline phosphatase (APISO): 119 U/L (ref 37–153)
BUN/Creatinine Ratio: 19 (calc) (ref 6–22)
BUN: 30 mg/dL — ABNORMAL HIGH (ref 7–25)
CO2: 25 mmol/L (ref 20–32)
Calcium: 9.4 mg/dL (ref 8.6–10.4)
Chloride: 106 mmol/L (ref 98–110)
Creat: 1.54 mg/dL — ABNORMAL HIGH (ref 0.60–0.95)
Globulin: 2.8 g/dL (calc) (ref 1.9–3.7)
Glucose, Bld: 110 mg/dL — ABNORMAL HIGH (ref 65–99)
Potassium: 4.8 mmol/L (ref 3.5–5.3)
Sodium: 138 mmol/L (ref 135–146)
Total Bilirubin: 0.5 mg/dL (ref 0.2–1.2)
Total Protein: 6.7 g/dL (ref 6.1–8.1)
eGFR: 33 mL/min/{1.73_m2} — ABNORMAL LOW (ref >=60)

## 2020-12-26 LAB — CBC WITH DIFFERENTIAL/PLATELET
Absolute Monocytes: 536 cells/uL (ref 200–950)
Basophils Absolute: 51 cells/uL (ref 0–200)
Basophils Relative: 0.9 %
Eosinophils Absolute: 222 cells/uL (ref 15–500)
Eosinophils Relative: 3.9 %
HCT: 30.5 % — ABNORMAL LOW (ref 35.0–45.0)
Hemoglobin: 9.8 g/dL — ABNORMAL LOW (ref 11.7–15.5)
Lymphs Abs: 1089 cells/uL (ref 850–3900)
MCH: 29.7 pg (ref 27.0–33.0)
MCHC: 32.1 g/dL (ref 32.0–36.0)
MCV: 92.4 fL (ref 80.0–100.0)
MPV: 10.5 fL (ref 7.5–12.5)
Monocytes Relative: 9.4 %
Neutro Abs: 3802 cells/uL (ref 1500–7800)
Neutrophils Relative %: 66.7 %
Platelets: 171 10*3/uL (ref 140–400)
RBC: 3.3 10*6/uL — ABNORMAL LOW (ref 3.80–5.10)
RDW: 12.2 % (ref 11.0–15.0)
Total Lymphocyte: 19.1 %
WBC: 5.7 10*3/uL (ref 3.8–10.8)

## 2020-12-26 LAB — TSH: TSH: 5.7 mIU/L — ABNORMAL HIGH (ref 0.40–4.50)

## 2020-12-26 MED ORDER — FLUTICASONE PROPIONATE 50 MCG/ACT NA SUSP: 2.0000 | Freq: Every day | NASAL | 6 refills | Status: AC

## 2021-01-29 ENCOUNTER — Telehealth: Payer: Self-pay | Admitting: Family Medicine

## 2021-01-29 NOTE — Telephone Encounter (Signed)
Left message for patient to call back and schedule Medicare Annual Wellness Visit (AWV) in office.   If not able to come in office, please offer to do virtually or by telephone.  Left office number and my jabber [phone].  Last AWV:01/30/2019  Please schedule at anytime with Nurse Health Advisor.

## 2021-02-17 ENCOUNTER — Other Ambulatory Visit: Payer: Self-pay | Admitting: Nephrology

## 2021-02-17 DIAGNOSIS — N189 Chronic kidney disease, unspecified: Secondary | ICD-10-CM

## 2021-02-17 DIAGNOSIS — Z9350 Unspecified cystostomy status: Secondary | ICD-10-CM

## 2021-02-17 DIAGNOSIS — Z905 Acquired absence of kidney: Secondary | ICD-10-CM

## 2021-02-17 DIAGNOSIS — C189 Malignant neoplasm of colon, unspecified: Secondary | ICD-10-CM

## 2021-02-17 DIAGNOSIS — I4819 Other persistent atrial fibrillation: Secondary | ICD-10-CM

## 2021-02-17 DIAGNOSIS — C641 Malignant neoplasm of right kidney, except renal pelvis: Secondary | ICD-10-CM

## 2021-02-17 DIAGNOSIS — E876 Hypokalemia: Secondary | ICD-10-CM

## 2021-02-17 DIAGNOSIS — R809 Proteinuria, unspecified: Secondary | ICD-10-CM

## 2021-02-19 ENCOUNTER — Other Ambulatory Visit: Payer: Self-pay

## 2021-02-19 ENCOUNTER — Ambulatory Visit
Admission: RE | Admit: 2021-02-19 | Discharge: 2021-02-19 | Disposition: A | Discharge status: Home / Self Care | Payer: Medicare Other | Source: Ambulatory Visit | Attending: Nephrology | Admitting: Nephrology

## 2021-02-19 DIAGNOSIS — E876 Hypokalemia: Secondary | ICD-10-CM | POA: Insufficient documentation

## 2021-02-19 DIAGNOSIS — I4819 Other persistent atrial fibrillation: Secondary | ICD-10-CM | POA: Diagnosis present

## 2021-02-19 DIAGNOSIS — Z905 Acquired absence of kidney: Secondary | ICD-10-CM | POA: Diagnosis present

## 2021-02-19 DIAGNOSIS — N189 Chronic kidney disease, unspecified: Secondary | ICD-10-CM | POA: Diagnosis present

## 2021-02-19 DIAGNOSIS — Z9350 Unspecified cystostomy status: Secondary | ICD-10-CM | POA: Diagnosis present

## 2021-02-19 DIAGNOSIS — C189 Malignant neoplasm of colon, unspecified: Secondary | ICD-10-CM | POA: Diagnosis present

## 2021-02-19 DIAGNOSIS — C641 Malignant neoplasm of right kidney, except renal pelvis: Secondary | ICD-10-CM | POA: Insufficient documentation

## 2021-02-19 DIAGNOSIS — R809 Proteinuria, unspecified: Secondary | ICD-10-CM | POA: Insufficient documentation

## 2021-02-19 DIAGNOSIS — D631 Anemia in chronic kidney disease: Secondary | ICD-10-CM | POA: Diagnosis present

## 2021-02-20 ENCOUNTER — Ambulatory Visit: Payer: Medicare Other

## 2021-05-11 ENCOUNTER — Other Ambulatory Visit: Payer: Self-pay | Admitting: Obstetrics and Gynecology

## 2021-05-11 DIAGNOSIS — Z1231 Encounter for screening mammogram for malignant neoplasm of breast: Secondary | ICD-10-CM

## 2021-05-21 ENCOUNTER — Ambulatory Visit: Payer: Medicare Other | Admitting: Family Medicine

## 2021-05-21 VITALS — BP 120/68 | HR 56 | Temp 97.6°F | Ht 66.0 in | Wt 152.0 lb

## 2021-05-21 DIAGNOSIS — J479 Bronchiectasis, uncomplicated: Secondary | ICD-10-CM | POA: Diagnosis not present

## 2021-05-21 DIAGNOSIS — E871 Hypo-osmolality and hyponatremia: Secondary | ICD-10-CM | POA: Diagnosis not present

## 2021-05-21 DIAGNOSIS — I480 Paroxysmal atrial fibrillation: Secondary | ICD-10-CM

## 2021-05-21 DIAGNOSIS — N1832 Chronic kidney disease, stage 3b: Secondary | ICD-10-CM

## 2021-05-21 DIAGNOSIS — Z905 Acquired absence of kidney: Secondary | ICD-10-CM

## 2021-05-21 DIAGNOSIS — E039 Hypothyroidism, unspecified: Secondary | ICD-10-CM

## 2021-05-21 MED ORDER — PROMETHAZINE HCL 25 MG PO TABS: 25.0000 mg | ORAL_TABLET | Freq: Three times a day (TID) | ORAL | 0 refills | Status: DC | PRN

## 2021-05-21 NOTE — Progress Notes (Signed)
? ?Subjective:  ? ? Patient ID: Jocelyn Moss, female    DOB: 1935-05-16, 86 y.o.   MRN: [id number] ? ?HPI ?10/24/21 ?Patient is a very sweet 86 year old Caucasian female with a past medical history of atrial fibrillation, breast cancer, colon cancer status post colostomy, and renal cell carcinoma.  She also has stage IV chronic kidney disease.  However she saw her nephrologist in September and her creatinine in September on the 29th was down to 1.48.  Most recently at my office it had been 1.96.  She has been making a concerted effort to drink more fluids.  She is also doing her flutter valve for bronchiectasis on a daily basis.  Recently she was seen by her dermatologist to have a skin cancer removed from her forehead.  Prior to that she had been coughing and having a productive cough with purulent sputum.  They started her on a short course of doxycycline just for 5 days and the cough almost immediately resolved.  However has returned after stopping the doxycycline.  She is consistently taking her Dulera, Protonix, however she is not using her Flonase consistently.  She denies any pleurisy or hemoptysis or fever or chills.  At that time, my plan was: ?I believe she may have recently had an acute exacerbation of her bronchiectasis partially treated by the dermatologist with doxycycline.  I will extend the doxycycline for a total of 7 days to hopefully prevent rebound.  Continued to encourage her to be consistent with her flutter valve, Dulera, Protonix, and add Flonase if she is having any nasal congestion and drainage.  Otherwise she seems to be doing quite well.  She is making a concerted effort to drink more to avoid dehydration.  I will check a CMP to monitor her renal function, CBC to monitor her anemia, and a TSH to monitor her hypothyroidism. ? ?05/21/21 ?Her nephrologist performed a renal ultrasound in January which showed no evidence of any mass or malignancy or hydronephrosis.  Overall she has been doing  well.  She has not been using her flutter valve consistently.  I encouraged her to try to do this because of her history of bronchiectasis.  I believe that this may help prevent future exacerbations and bronchitis.  However she denies any pleurisy or hemoptysis or purulent sputum.  She does report some shortness of breath with activity which I believe is due to deconditioning.  She denies any chest pain or angina.  Today she is in normal sinus rhythm.  There is no evidence of pulmonary edema.  She denies any dysuria or hematuria or urgency or frequency.  She denies any nausea or vomiting.  She denies any falls.  She does report decreased stamina but she admits that she has been unable to do as much physical activity as she was doing previously.  Believe a lot of this could be due to deconditioning.  She is trying to focus daily on drinking more fluid as she has a tendency to become dehydrated ? ?Past Medical History:  ?Diagnosis Date  ? Atrial fibrillation ([place])   ? Breast cancer ([place]) 1990  ? Chest pain   ? Colon cancer ([place])   ? ileostomy 2000  ? Dysrhythmia   ? History of colon cancer   ? Hypercholesterolemia   ? Hypothyroidism   ? Multiple lacunar infarcts ([place])   ? MVP (mitral valve prolapse)   ? Osteoporosis   ? osteopenia  ? Personal history of chemotherapy   ? 6 months  ?  Personal history of radiation therapy   ? 6 months  ? Renal cell carcinoma ([place])   ? stage 1 s/p resection Dr. Tresa Moore 2020  ? SOB (shortness of breath)   ? ?Past Surgical History:  ?Procedure Laterality Date  ? BREAST BIOPSY    ? BREAST LUMPECTOMY Left   ? COLON SURGERY  2000  ? ileostomy  ? ILEOSTOMY    ? ROBOTIC ASSITED PARTIAL NEPHRECTOMY Right 02/01/2018  ? Procedure: XI ROBOTIC ASSISTED RETROPERITONEAL PARTIAL NEPHRECTOMY;  Surgeon: Alexis Frock, MD;  Location: [facility];  Service: Urology;  Laterality: Right;  ? ?Current Outpatient Medications on File Prior to Visit  ?Medication Sig Dispense Refill  ? acetaminophen (TYLENOL) 500 MG  tablet Take 500 mg by mouth every 6 (six) hours as needed (pain).    ? atorvastatin (LIPITOR) 20 MG tablet TAKE 1 TABLET BY MOUTH EVERY DAY 90 tablet 2  ? Calcium Carb-Cholecalciferol (CALCIUM 600+D) 600-800 MG-UNIT TABS Take 1 tablet by mouth 2 (two) times daily.    ? calcium carbonate (OSCAL) 1500 (600 Ca) MG TABS tablet Take by mouth.    ? clopidogrel (PLAVIX) 75 MG tablet Take 1 tablet by mouth daily.    ? doxycycline (VIBRA-TABS) 100 MG tablet Take 1 tablet (100 mg total) by mouth 2 (two) times daily. 14 tablet 0  ? DULERA 100-5 MCG/ACT AERO USE 2 INHALATIONS ORALLY   TWICE DAILY (Patient taking differently: Inhale 2 puffs into the lungs at bedtime.) 39 g 3  ? ferrous gluconate (FERGON) 324 MG tablet Take 324 mg by mouth 2 (two) times daily.    ? fluticasone (FLONASE) 50 MCG/ACT nasal spray Place into the nose.    ? fluticasone (FLONASE) 50 MCG/ACT nasal spray Place 2 sprays into both nostrils daily. 16 g 6  ? levothyroxine (SYNTHROID) 100 MCG tablet Take 1 tablet (100 mcg total) by mouth every other day. 45 tablet 3  ? levothyroxine (SYNTHROID) 112 MCG tablet Take 1 tablet (112 mcg total) by mouth every other day. 45 tablet 3  ? metoprolol tartrate (LOPRESSOR) 25 MG tablet Take 0.5 tablets (12.5 mg total) by mouth 2 (two) times daily as needed (palpitations). 180 tablet 3  ? MULTIPLE MINERALS-VITAMINS PO Take by mouth.    ? Multiple Vitamins-Minerals (PRESERVISION AREDS 2) CAPS Take 1 capsule by mouth 2 (two) times daily.    ? pantoprazole (PROTONIX) 40 MG tablet TAKE 1 TABLET BY MOUTH EVERY DAY (Patient taking differently: Take 40 mg by mouth every morning.) 90 tablet 3  ? promethazine (PHENERGAN) 25 MG tablet Take 1 tablet (25 mg total) by mouth every 8 (eight) hours as needed for nausea or vomiting. (Patient not taking: Reported on 10/24/2020) 20 tablet 0  ? Respiratory Therapy Supplies (FLUTTER) DEVI 1 Device by Does not apply route as directed. 1 each 0  ? Rivaroxaban (XARELTO) 15 MG TABS tablet Take 1  tablet (15 mg total) by mouth daily with supper. 30 tablet 5  ? ?No current facility-administered medications on file prior to visit.  ? ?Allergies  ?Allergen Reactions  ? Crestor [Rosuvastatin Calcium] Other (See Comments)  ?  Joint pain, effected liver function ?  ? Zetia [Ezetimibe] Other (See Comments)  ?  Joint pain, effected liver function ?  ? ?Social History  ? ?Socioeconomic History  ? Marital status: Married  ?  Spouse name: Not on file  ? Number of children: Not on file  ? Years of education: Not on file  ? Highest education level: Not on  file  ?Occupational History  ? Not on file  ?Tobacco Use  ? Smoking status: Never  ? Smokeless tobacco: Never  ?Vaping Use  ? Vaping Use: Never used  ?Substance and Sexual Activity  ? Alcohol use: No  ? Drug use: No  ? Sexual activity: Not on file  ?Other Topics Concern  ? Not on file  ?Social History Narrative  ? Not on file  ? ?Social Determinants of Health  ? ?Financial Resource Strain: Not on file  ?Food Insecurity: Not on file  ?Transportation Needs: Not on file  ?Physical Activity: Not on file  ?Stress: Not on file  ?Social Connections: Not on file  ?Intimate Partner Violence: Not on file  ? ? ? ? ? ?Review of Systems  ?All other systems reviewed and are negative. ? ?   ?Objective:  ? Physical Exam ?Vitals reviewed.  ?Constitutional:   ?   General: She is not in acute distress. ?   Appearance: Normal appearance. She is normal weight. She is not ill-appearing or toxic-appearing.  ?HENT:  ?   Mouth/Throat:  ?   Mouth: Mucous membranes are moist.  ?Eyes:  ?   Conjunctiva/sclera: Conjunctivae normal.  ?   Pupils: Pupils are equal, round, and reactive to light.  ?Cardiovascular:  ?   Rate and Rhythm: Normal rate and regular rhythm.  ?   Pulses: Normal pulses.  ?   Heart sounds: Normal heart sounds. No murmur heard. ?  No friction rub. No gallop.  ?Pulmonary:  ?   Effort: Pulmonary effort is normal. No respiratory distress.  ?   Breath sounds: Normal breath sounds. No  stridor.  ?Abdominal:  ?   General: Abdomen is flat. Bowel sounds are normal. There is no distension.  ?   Tenderness: There is no abdominal tenderness. There is no guarding.  ?Musculoskeletal:  ?   Right lowe

## 2021-05-21 NOTE — Addendum Note (Signed)
Addended by: Colman Cater on: 05/21/2021 09:43 AM ? ? Modules accepted: Orders ? ?

## 2021-05-22 ENCOUNTER — Other Ambulatory Visit: Payer: Self-pay | Admitting: Family Medicine

## 2021-05-22 DIAGNOSIS — E038 Other specified hypothyroidism: Secondary | ICD-10-CM

## 2021-05-22 LAB — COMPLETE METABOLIC PANEL WITH GFR
AG Ratio: 1.3 (calc) (ref 1.0–2.5)
ALT: 14 U/L (ref 6–29)
AST: 23 U/L (ref 10–35)
Albumin: 4.1 g/dL (ref 3.6–5.1)
Alkaline phosphatase (APISO): 108 U/L (ref 37–153)
BUN/Creatinine Ratio: 18 (calc) (ref 6–22)
BUN: 32 mg/dL — ABNORMAL HIGH (ref 7–25)
CO2: 23 mmol/L (ref 20–32)
Calcium: 9.3 mg/dL (ref 8.6–10.4)
Chloride: 104 mmol/L (ref 98–110)
Creat: 1.77 mg/dL — ABNORMAL HIGH (ref 0.60–0.95)
Globulin: 3.1 g/dL (calc) (ref 1.9–3.7)
Glucose, Bld: 78 mg/dL (ref 65–99)
Potassium: 4.7 mmol/L (ref 3.5–5.3)
Sodium: 136 mmol/L (ref 135–146)
Total Bilirubin: 0.6 mg/dL (ref 0.2–1.2)
Total Protein: 7.2 g/dL (ref 6.1–8.1)
eGFR: 28 mL/min/{1.73_m2} — ABNORMAL LOW (ref >=60)

## 2021-05-22 LAB — PROTEIN / CREATININE RATIO, URINE
Creatinine, Urine: 113 mg/dL (ref 20–275)
Protein/Creat Ratio: 159 mg/g creat (ref 24–184)
Protein/Creatinine Ratio: 0.159 mg/mg creat (ref 0.024–0.184)
Total Protein, Urine: 18 mg/dL (ref 5–24)

## 2021-05-22 LAB — CBC WITH DIFFERENTIAL/PLATELET
Absolute Monocytes: 510 cells/uL (ref 200–950)
Basophils Absolute: 20 cells/uL (ref 0–200)
Basophils Relative: 0.4 %
Eosinophils Absolute: 199 cells/uL (ref 15–500)
Eosinophils Relative: 3.9 %
HCT: 33.3 % — ABNORMAL LOW (ref 35.0–45.0)
Hemoglobin: 11 g/dL — ABNORMAL LOW (ref 11.7–15.5)
Lymphs Abs: 882 cells/uL (ref 850–3900)
MCH: 30.8 pg (ref 27.0–33.0)
MCHC: 33 g/dL (ref 32.0–36.0)
MCV: 93.3 fL (ref 80.0–100.0)
MPV: 10.3 fL (ref 7.5–12.5)
Monocytes Relative: 10 %
Neutro Abs: 3488 cells/uL (ref 1500–7800)
Neutrophils Relative %: 68.4 %
Platelets: 167 10*3/uL (ref 140–400)
RBC: 3.57 10*6/uL — ABNORMAL LOW (ref 3.80–5.10)
RDW: 12.7 % (ref 11.0–15.0)
Total Lymphocyte: 17.3 %
WBC: 5.1 10*3/uL (ref 3.8–10.8)

## 2021-05-22 LAB — TSH: TSH: 3.36 mIU/L (ref 0.40–4.50)

## 2021-06-15 ENCOUNTER — Ambulatory Visit
Admission: RE | Admit: 2021-06-15 | Discharge: 2021-06-15 | Disposition: A | Discharge status: Home / Self Care | Payer: Medicare Other | Source: Ambulatory Visit | Attending: Obstetrics and Gynecology | Admitting: Obstetrics and Gynecology

## 2021-06-15 DIAGNOSIS — Z1231 Encounter for screening mammogram for malignant neoplasm of breast: Secondary | ICD-10-CM

## 2021-06-19 ENCOUNTER — Other Ambulatory Visit: Payer: Self-pay | Admitting: Family Medicine

## 2021-06-19 DIAGNOSIS — Z8673 Personal history of transient ischemic attack (TIA), and cerebral infarction without residual deficits: Secondary | ICD-10-CM

## 2021-06-23 NOTE — Telephone Encounter (Signed)
Requested medications are due for refill today.  yes  Requested medications are on the active medications list.  yes  Last refill. 11/21/2020 #30 5 refills  Future visit scheduled.   no  Notes to clinic.  Med list has both xarelto and Plavix listed - please review.    Requested Prescriptions  Pending Prescriptions Disp Refills   XARELTO 15 MG TABS tablet [Pharmacy Med Name: XARELTO 15 MG TABLET] 30 tablet 5    Sig: TAKE 1 TABLET (15 MG TOTAL) BY MOUTH DAILY WITH SUPPER     Hematology: Anticoagulants - rivaroxaban Failed - 06/19/2021  1:58 AM      Failed - Cr in normal range and within 360 days    Creat  Date Value Ref Range Status  05/21/2021 1.77 (H) 0.60 - 0.95 mg/dL Final   Creatinine, Urine  Date Value Ref Range Status  05/21/2021 113 20 - 275 mg/dL Final         Failed - HCT in normal range and within 360 days    HCT  Date Value Ref Range Status  05/21/2021 33.3 (L) 35.0 - 45.0 % Final         Failed - HGB in normal range and within 360 days    Hemoglobin  Date Value Ref Range Status  05/21/2021 11.0 (L) 11.7 - 15.5 g/dL Final         Passed - ALT in normal range and within 360 days    ALT  Date Value Ref Range Status  05/21/2021 14 6 - 29 U/L Final         Passed - AST in normal range and within 360 days    AST  Date Value Ref Range Status  05/21/2021 23 10 - 35 U/L Final         Passed - PLT in normal range and within 360 days    Platelets  Date Value Ref Range Status  05/21/2021 167 140 - 400 Thousand/uL Final         Passed - eGFR is 15 or above and within 360 days    GFR, Est African American  Date Value Ref Range Status  07/14/2020 22 (L) > OR = 60 mL/min/1.52m2 Final   GFR, Est Non African American  Date Value Ref Range Status  07/14/2020 19 (L) > OR = 60 mL/min/1.48m2 Final   GFR, Estimated  Date Value Ref Range Status  09/08/2020 37 (L) >60 mL/min Final    Comment:    (NOTE) Calculated using the CKD-EPI Creatinine Equation (2021)     GFR  Date Value Ref Range Status  09/24/2019 27.63 (L) >60.00 mL/min Final   eGFR  Date Value Ref Range Status  05/21/2021 28 (L) > OR = 60 mL/min/1.53m2 Final    Comment:    The eGFR is based on the CKD-EPI 2021 equation. To calculate  the new eGFR from a previous Creatinine or Cystatin C result, go to https://www.kidney.org/professionals/ kdoqi/gfr%5Fcalculator          Passed - Patient is not pregnant      Passed - Valid encounter within last 12 months    Recent Outpatient Visits           1 month ago Hypothyroidism, unspecified type   [place], Cammie Mcgee, MD   6 months ago Hypothyroidism, unspecified type   [place] Susy Frizzle, MD   8 months ago Persistent atrial fibrillation ([place])   [place]  [facility] Susy Frizzle, MD   8 months ago Persistent atrial fibrillation [facility])   [facility] Eulogio Bear, NP   9 months ago Stage 3b chronic kidney disease ([place])   [facility], Cammie Mcgee, MD

## 2021-07-04 ENCOUNTER — Other Ambulatory Visit: Payer: Self-pay | Admitting: Family Medicine

## 2021-07-06 NOTE — Telephone Encounter (Signed)
Requested Prescriptions  Pending Prescriptions Disp Refills  . [place] 100-5 MCG/ACT AERO [Pharmacy Med Name: DULERA INH 100-5MCG] 39 g 3    Sig: USE 2 INHALATIONS ORALLY   TWICE DAILY     Pulmonology:  [employer] Passed - 07/06/2021 10:13 AM      Passed - Valid encounter within last 12 months    Recent Outpatient Visits          1 month ago Hypothyroidism, unspecified type   [place] Susy Frizzle, MD   6 months ago Hypothyroidism, unspecified type   [place] Susy Frizzle, MD   8 months ago Persistent atrial fibrillation [facility])   [place] Susy Frizzle, MD   9 months ago Persistent atrial fibrillation [facility])   [facility] Eulogio Bear, NP   9 months ago Stage 3b chronic kidney disease ([place])   [facility] [place], Cammie Mcgee, MD

## 2021-07-14 ENCOUNTER — Other Ambulatory Visit: Payer: Self-pay | Admitting: Family Medicine

## 2021-07-14 DIAGNOSIS — E038 Other specified hypothyroidism: Secondary | ICD-10-CM

## 2021-07-14 NOTE — Telephone Encounter (Signed)
Requested Prescriptions  Pending Prescriptions Disp Refills  . levothyroxine (SYNTHROID) 112 MCG tablet [Pharmacy Med Name: LEVOTHYROXINE 112 MCG TABLET] 45 tablet 1    Sig: TAKE 1 TABLET (112 MCG TOTAL) BY MOUTH EVERY OTHER DAY.     There is no refill protocol information for this order

## 2021-08-18 ENCOUNTER — Other Ambulatory Visit: Payer: Self-pay | Admitting: Family Medicine

## 2021-09-17 ENCOUNTER — Telehealth: Payer: Self-pay

## 2021-09-17 NOTE — Telephone Encounter (Signed)
[name] brought paper in stating that pt is taking 1/2 tablet of Metoprolol twice a day, when she is supposed to be taking a whole tablet twice a day. BP for the last 24 hours have been 121/69, 121/72, 130/72 and 125/68 with a pulse rate 59-60. Is it okay for Jocelyn Moss to continue just 1/2 tablet BID or increase to 1 whole? Thank you!

## 2021-09-17 NOTE — Telephone Encounter (Signed)
[name] came into office with info about how pt is now taking this med metoprolol tartrate (LOPRESSOR) 7. [name] would like a cb about this info please. Note given to nurse.  Cb#: [phone]

## 2021-09-24 ENCOUNTER — Ambulatory Visit: Payer: Medicare Other | Admitting: Family Medicine

## 2021-09-24 VITALS — BP 118/62 | HR 56 | Temp 97.2°F | Ht 66.0 in | Wt 147.4 lb

## 2021-09-24 DIAGNOSIS — R009 Unspecified abnormalities of heart beat: Secondary | ICD-10-CM

## 2021-09-24 DIAGNOSIS — I480 Paroxysmal atrial fibrillation: Secondary | ICD-10-CM | POA: Diagnosis not present

## 2021-09-24 DIAGNOSIS — R030 Elevated blood-pressure reading, without diagnosis of hypertension: Secondary | ICD-10-CM | POA: Diagnosis not present

## 2021-09-24 NOTE — Progress Notes (Signed)
Subjective:    Patient ID: Jocelyn Moss, female    DOB: Oct 04, 1935, 86 y.o.   MRN: [id number]  HPI Patient is here today for follow-up.  She sees her nephrologist next week to do blood work.  They check her CBC and her kidney function at that visit.  Therefore I will not check that today.  The medication list is incorrect.  She is not taking Plavix.  She is only taking Xarelto.  We discontinued Plavix last year in September after she had bleeding from an ostomy site.  She is still on primary stroke prevention with Xarelto due to her atrial fibrillation.  Her blood pressures at home are 120-130/60-70.  Heart rates are in the 60s.  She denies any syncope or near syncope.  She denies any chest pain or shortness of breath or dyspnea on exertion. Past Medical History:  Diagnosis Date   Atrial fibrillation ([place])    Breast cancer ([place]) 1990   Chest pain    Colon cancer ([place])    ileostomy 2000   Dysrhythmia    History of colon cancer    Hypercholesterolemia    Hypothyroidism    Multiple lacunar infarcts ([facility])    MVP (mitral valve prolapse)    Osteoporosis    osteopenia   Personal history of chemotherapy    6 months   Personal history of radiation therapy    6 months   Renal cell carcinoma ([facility])    stage 1 s/p resection Dr. Tresa Moore 2020   SOB (shortness of breath)    Past Surgical History:  Procedure Laterality Date   BREAST BIOPSY     BREAST LUMPECTOMY Left    COLON SURGERY  2000   ileostomy   ILEOSTOMY     ROBOTIC ASSITED PARTIAL NEPHRECTOMY Right 02/01/2018   Procedure: XI ROBOTIC ASSISTED RETROPERITONEAL PARTIAL NEPHRECTOMY;  Surgeon: Alexis Frock, MD;  Location: [facility];  Service: Urology;  Laterality: Right;   Current Outpatient Medications on File Prior to Visit  Medication Sig Dispense Refill   acetaminophen (TYLENOL) 500 MG tablet Take 500 mg by mouth every 6 (six) hours as needed (pain).     atorvastatin (LIPITOR) 20 MG tablet TAKE 1 TABLET BY MOUTH EVERY DAY 90 tablet  2   clopidogrel (PLAVIX) 75 MG tablet Take 1 tablet by mouth daily.     doxycycline (VIBRA-TABS) 100 MG tablet Take 1 tablet (100 mg total) by mouth 2 (two) times daily. 14 tablet 0   DULERA 100-5 MCG/ACT AERO USE 2 INHALATIONS ORALLY   TWICE DAILY 39 g 3   ferrous gluconate (FERGON) 324 MG tablet Take 324 mg by mouth 2 (two) times daily.     fluticasone (FLONASE) 50 MCG/ACT nasal spray Place into the nose.     fluticasone (FLONASE) 50 MCG/ACT nasal spray Place 2 sprays into both nostrils daily. 16 g 6   levothyroxine (SYNTHROID) 100 MCG tablet TAKE 1 TABLET (100 MCG TOTAL) BY MOUTH EVERY OTHER DAY. 45 tablet 3   levothyroxine (SYNTHROID) 112 MCG tablet TAKE 1 TABLET (112 MCG TOTAL) BY MOUTH EVERY OTHER DAY. 45 tablet 1   metoprolol tartrate (LOPRESSOR) 25 MG tablet Take 0.5 tablets (12.5 mg total) by mouth 2 (two) times daily as needed (palpitations). 180 tablet 3   Multiple Vitamins-Minerals (PRESERVISION AREDS 2) CAPS Take 1 capsule by mouth 2 (two) times daily.     pantoprazole (PROTONIX) 40 MG tablet TAKE 1 TABLET BY MOUTH EVERY DAY (Patient taking differently: Take 40 mg by  mouth every morning.) 90 tablet 3   promethazine (PHENERGAN) 25 MG tablet Take 1 tablet (25 mg total) by mouth every 8 (eight) hours as needed for nausea or vomiting. 20 tablet 0   Respiratory Therapy Supplies (FLUTTER) DEVI 1 Device by Does not apply route as directed. 1 each 0   XARELTO 15 MG TABS tablet TAKE 1 TABLET (15 MG TOTAL) BY MOUTH DAILY WITH SUPPER 30 tablet 5   No current facility-administered medications on file prior to visit.   Allergies  Allergen Reactions   Crestor [Rosuvastatin Calcium] Other (See Comments)    Joint pain, effected liver function    Zetia [Ezetimibe] Other (See Comments)    Joint pain, effected liver function      Review of Systems  Cardiovascular:  Negative for chest pain and palpitations.  Skin:        Mild bleeding with ostomy bag changes that is improved from prior  visit.  All other systems reviewed and are negative.      Objective:   Physical Exam Vitals reviewed.  Constitutional:      Appearance: Normal appearance. She is normal weight.  Cardiovascular:     Rate and Rhythm: Normal rate and regular rhythm.     Heart sounds: Normal heart sounds. No murmur heard.    No gallop.  Pulmonary:     Effort: Pulmonary effort is normal. No respiratory distress.     Breath sounds: Normal breath sounds. No stridor. No wheezing or rhonchi.  Musculoskeletal:     Right lower leg: No edema.     Left lower leg: No edema.  Skin:    General: Skin is warm and dry.  Neurological:     Mental Status: She is alert.           Assessment & Plan:   Elevated heart rate with elevated blood pressure without diagnosis of hypertension  Paroxysmal atrial fibrillation (HCC)  Continue taking Metoprolol 12.5mg  BID.  She is actually only been taking metoprolol 12.5 mg twice daily however based on her blood pressures and her heart rates.  This is all that she needs to take so we will continue that dose.  Stay off Plavix due to bleeding.  Continue Xarelto for primary prevention of stroke.  Defer to nephrology to check a CMP and CBC at her office visit next week.  I plan on checking a TSH the next time I see the patient back.  Did encourage her to get a flu shot and a COVID-vaccine.

## 2021-10-08 ENCOUNTER — Ambulatory Visit
Admission: RE | Admit: 2021-10-08 | Discharge: 2021-10-08 | Disposition: A | Discharge status: Home / Self Care | Payer: Medicare Other | Source: Ambulatory Visit | Attending: Urology | Admitting: Urology

## 2021-10-08 ENCOUNTER — Other Ambulatory Visit: Payer: Self-pay | Admitting: Urology

## 2021-10-08 DIAGNOSIS — C3491 Malignant neoplasm of unspecified part of right bronchus or lung: Secondary | ICD-10-CM

## 2021-10-09 ENCOUNTER — Telehealth: Payer: Self-pay

## 2021-10-09 NOTE — Telephone Encounter (Signed)
[name] called to advised that pt was seen by Dr. Lanora Manis at [facility] and pt's HGB was 10. He did increase her iron to TID. Pt also has had blood work, CXR and CT Scan for Dr. Tammi Klippel at [facility]. She will be seeing Dr. Tammi Klippel this week. Thank you.

## 2021-11-09 ENCOUNTER — Other Ambulatory Visit: Payer: Self-pay

## 2021-11-09 NOTE — Telephone Encounter (Signed)
Pharmacy faxed a refill request for pantoprazole (PROTONIX) 40 MG tablet [[id number]]    Order Details Dose, Route, Frequency: As Directed  Dispense Quantity: 90 tablet Refills: 3        Sig: TAKE 1 TABLET BY MOUTH EVERY DAY  Patient taking differently: Take 40 mg by mouth every morning.       Start Date: 09/01/20 End Date: --  Written Date: 09/01/20 Expiration Date: 09/01/21

## 2021-11-10 MED ORDER — PANTOPRAZOLE SODIUM 40 MG PO TBEC: 40.0000 mg | DELAYED_RELEASE_TABLET | Freq: Every day | ORAL | 1 refills | Status: DC

## 2021-11-10 NOTE — Telephone Encounter (Signed)
Requested Prescriptions  Pending Prescriptions Disp Refills  . pantoprazole (PROTONIX) 40 MG tablet 90 tablet 1    Sig: Take 1 tablet (40 mg total) by mouth daily.     Gastroenterology: Proton Pump Inhibitors Passed - 11/09/2021  4:41 PM      Passed - Valid encounter within last 12 months    Recent Outpatient Visits          5 months ago Hypothyroidism, unspecified type   [place] Denn[place] Schaumann, Cammie Mcgee, MD   10 months ago Hypothyroidism, unspecified type   [place] Dennard Schaumann, Cammie Mcgee, MD   1 year ago Persistent atrial fibrillation [facility])   [place] Dennard Schaumann, Cammie Mcgee, MD   1 year ago Persistent atrial fibrillation [facility])   [place] Eulogio Bear, NP   1 year ago Stage 3b chronic kidney disease ([place])   [facility] [place], Cammie Mcgee, MD

## 2021-11-23 ENCOUNTER — Ambulatory Visit (INDEPENDENT_AMBULATORY_CARE_PROVIDER_SITE_OTHER): Payer: Medicare Other

## 2021-11-23 DIAGNOSIS — Z23 Encounter for immunization: Secondary | ICD-10-CM | POA: Diagnosis not present

## 2021-12-04 ENCOUNTER — Ambulatory Visit: Payer: Medicare Other | Admitting: Family Medicine

## 2021-12-04 VITALS — BP 142/100 | HR 120 | Temp 97.7°F | Ht 67.0 in | Wt 151.0 lb

## 2021-12-04 DIAGNOSIS — I4891 Unspecified atrial fibrillation: Secondary | ICD-10-CM | POA: Diagnosis not present

## 2021-12-04 MED ORDER — DILTIAZEM HCL 30 MG PO TABS: 30.0000 mg | ORAL_TABLET | Freq: Four times a day (QID) | ORAL | 0 refills | Status: DC | PRN

## 2021-12-04 NOTE — Progress Notes (Signed)
Subjective:    Patient ID: Jocelyn Moss, female    DOB: [birth year], 86 y.o.   MRN: [id number]  HPI Patient states that she was in her normal state of health until yesterday.  Yesterday she was checking her heart rate.  She would check it repeatedly.  If she checked it 2 or 3 times in a row, 2 of the values would be normal and one of the readings would be above 100.  This continued all throughout the day yesterday prompting her visit today.  Upon arrival, her blood pressure is elevated and her heart rate is over 120.  It is irregular on exam.  She denies any chest pain or shortness of breath or dyspnea on exertion.  There is no peripheral edema and her lungs are clear to auscultation bilaterally.  There is no evidence of any pulmonary edema. Past Medical History:  Diagnosis Date   Atrial fibrillation ([place])    Breast cancer ([place]) 1990   Chest pain    Colon cancer ([place])    ileostomy 2000   Dysrhythmia    History of colon cancer    Hypercholesterolemia    Hypothyroidism    Multiple lacunar infarcts (HCC)    MVP (mitral valve prolapse)    Osteoporosis    osteopenia   Personal history of chemotherapy    6 months   Personal history of radiation therapy    6 months   Renal cell carcinoma (HCC)    stage 1 s/p resection Dr. Tresa Moore 2020   SOB (shortness of breath)    Past Surgical History:  Procedure Laterality Date   BREAST BIOPSY     BREAST LUMPECTOMY Left    COLON SURGERY  2000   ileostomy   ILEOSTOMY     ROBOTIC ASSITED PARTIAL NEPHRECTOMY Right 02/01/2018   Procedure: XI ROBOTIC ASSISTED RETROPERITONEAL PARTIAL NEPHRECTOMY;  Surgeon: Alexis Frock, MD;  [facility]: [facility];  Service: Urology;  Laterality: Right;   Current Outpatient Medications on File Prior to Visit  Medication Sig Dispense Refill   acetaminophen (TYLENOL) 500 MG tablet Take 500 mg by mouth every 6 (six) hours as needed (pain).     atorvastatin (LIPITOR) 20 MG tablet TAKE 1 TABLET BY MOUTH EVERY DAY 90 tablet 2    calcium citrate (CALCITRATE - DOSED IN MG ELEMENTAL CALCIUM) 950 (200 Ca) MG tablet Take 200 mg of elemental calcium by mouth daily. OTC     DULERA 100-5 MCG/ACT AERO USE 2 INHALATIONS ORALLY   TWICE DAILY 39 g 3   ferrous gluconate (FERGON) 324 MG tablet Take 324 mg by mouth 2 (two) times daily.     fluticasone (FLONASE) 50 MCG/ACT nasal spray Place into the nose.     levothyroxine (SYNTHROID) 100 MCG tablet TAKE 1 TABLET (100 MCG TOTAL) BY MOUTH EVERY OTHER DAY. 45 tablet 3   levothyroxine (SYNTHROID) 112 MCG tablet TAKE 1 TABLET (112 MCG TOTAL) BY MOUTH EVERY OTHER DAY. 45 tablet 1   metoprolol tartrate (LOPRESSOR) 25 MG tablet Take 0.5 tablets (12.5 mg total) by mouth 2 (two) times daily as needed (palpitations). 180 tablet 3   Multiple Vitamins-Minerals (PRESERVISION AREDS 2) CAPS Take 1 capsule by mouth 2 (two) times daily.     pantoprazole (PROTONIX) 40 MG tablet Take 1 tablet (40 mg total) by mouth daily. 90 tablet 1   promethazine (PHENERGAN) 25 MG tablet Take 1 tablet (25 mg total) by mouth every 8 (eight) hours as needed for nausea or vomiting. 20 tablet  0   Respiratory Therapy Supplies (FLUTTER) DEVI 1 Device by Does not apply route as directed. 1 each 0   XARELTO 15 MG TABS tablet TAKE 1 TABLET (15 MG TOTAL) BY MOUTH DAILY WITH SUPPER 30 tablet 5   fluticasone (FLONASE) 50 MCG/ACT nasal spray Place 2 sprays into both nostrils daily. (Patient not taking: Reported on 12/04/2021) 16 g 6   No current facility-administered medications on file prior to visit.   Allergies  Allergen Reactions   Crestor [Rosuvastatin Calcium] Other (See Comments)    Joint pain, effected liver function    Zetia [Ezetimibe] Other (See Comments)    Joint pain, effected liver function    Social History   Socioeconomic History   Marital status: Married    Spouse name: Not on file   Number of children: Not on file   Years of education: Not on file   Highest education level: Not on file  Occupational  History   Not on file  Tobacco Use   Smoking status: Never   Smokeless tobacco: Never  Vaping Use   Vaping Use: Never used  Substance and Sexual Activity   Alcohol use: No   Drug use: No   Sexual activity: Not on file  Other Topics Concern   Not on file  Social History Narrative   Not on file   Social Determinants of Health   Financial Resource Strain: Not on file  Food Insecurity: Not on file  Transportation Needs: Not on file  Physical Activity: Not on file  Stress: Not on file  Social Connections: Not on file  Intimate Partner Violence: Not on file       Review of Systems  All other systems reviewed and are negative.      Objective:   Physical Exam Vitals reviewed.  Constitutional:      General: She is not in acute distress.    Appearance: Normal appearance. She is normal weight. She is not ill-appearing or toxic-appearing.  HENT:     Mouth/Throat:     Mouth: Mucous membranes are moist.  Eyes:     Conjunctiva/sclera: Conjunctivae normal.     Pupils: Pupils are equal, round, and reactive to light.  Cardiovascular:     Rate and Rhythm: Tachycardia present. Rhythm irregular.     Pulses: Normal pulses.     Heart sounds: Normal heart sounds. No murmur heard.    No friction rub. No gallop.  Pulmonary:     Effort: Pulmonary effort is normal. No respiratory distress.     Breath sounds: Normal breath sounds. No stridor.  Abdominal:     General: Abdomen is flat. Bowel sounds are normal. There is no distension.     Tenderness: There is no abdominal tenderness. There is no guarding.  Musculoskeletal:     Right lower leg: No edema.     Left lower leg: No edema.  Skin:    Findings: No rash.  Neurological:     General: No focal deficit present.     Mental Status: She is alert. Mental status is at baseline.     Cranial Nerves: No cranial nerve deficit.     Sensory: No sensory deficit.     Motor: No weakness.     Coordination: Coordination normal.     Gait:  Gait normal.    EKG shows normal sinus rhythm with frequent PACs.  Patient's average heart rate is in the 60s.  When I palpate her pulse for 30 seconds her heart rate  is averaging in the 80s but she is having frequent PACs.       Assessment & Plan:  Atrial fibrillation, unspecified type ([place]) - Plan: EKG 12-Lead Patient is in normal sinus rhythm today.  But she is experiencing frequent PAC's.  This is causing her blood pressure monitor to give her ongoing correct heart rate.  Therefore I recommended that they check her heart rate with her blood pressure.  If the meter gives her a heart rate greater than 100 I want him to count her pulse for a minute.  It is less than 100 no changes are necessary.  If is greater than 100 repeat again in 10 minutes.  If it is higher than 110 I want him to give her Cardizem 30 mg p.o. every 6 hours as needed heart rate greater than 110.  We will see how often may have to administer the Cardizem over the next week and then maybe adjust metoprolol.  However her baseline heart rate is typically in the 50s or 60s and therefore I am concerned that she may become bradycardic if we increase the metoprolol permanently.  At the present time she is not short of breath and she is completely asymptomatic.  By the time she left the exam room her heart rate was in the 80s.

## 2021-12-26 ENCOUNTER — Other Ambulatory Visit: Payer: Self-pay | Admitting: Family Medicine

## 2021-12-28 ENCOUNTER — Other Ambulatory Visit: Payer: Self-pay

## 2021-12-28 ENCOUNTER — Telehealth: Payer: Self-pay

## 2021-12-28 DIAGNOSIS — R009 Unspecified abnormalities of heart beat: Secondary | ICD-10-CM

## 2021-12-28 DIAGNOSIS — I4891 Unspecified atrial fibrillation: Secondary | ICD-10-CM

## 2021-12-28 MED ORDER — METOPROLOL TARTRATE 25 MG PO TABS: ORAL_TABLET | ORAL | 0 refills | Status: DC

## 2021-12-28 NOTE — Telephone Encounter (Signed)
Requested Prescriptions  Pending Prescriptions Disp Refills   metoprolol tartrate (LOPRESSOR) 25 MG tablet [Pharmacy Med Name: METOPROLOL TARTRATE 25 MG TAB] 180 tablet 0    Sig: TAKE 1/2 TABLET (12.5 MG TOTAL) BY MOUTH 2 (TWO) TIMES DAILY AS NEEDED (PALPITATIONS).     Cardiovascular:  Beta Blockers Failed - 12/26/2021  9:17 AM      Failed - Last BP in normal range    BP Readings from Last 1 Encounters:  12/04/21 (!) 142/100         Failed - Last Heart Rate in normal range    Pulse Readings from Last 1 Encounters:  12/04/21 (!) 120         Failed - Valid encounter within last 6 months    Recent Outpatient Visits           7 months ago Hypothyroidism, unspecified type   [place] Susy Frizzle, MD   1 year ago Hypothyroidism, unspecified type   [place] Susy Frizzle, MD   1 year ago Persistent atrial fibrillation [facility])   [place] Dennard Schaumann, Cammie Mcgee, MD   1 year ago Persistent atrial fibrillation [facility])   [place] Eulogio Bear, NP   1 year ago Stage 3b chronic kidney disease ([place])   [facility] Pickard, Cammie Mcgee, MD

## 2021-12-28 NOTE — Telephone Encounter (Signed)
Prescription Request  12/28/2021  Is this a "Controlled Substance" medicine? No  LOV: Visit date not found  What is the name of the medication or equipment? metoprolol tartrate (LOPRESSOR) 25 MG tablet [[id number]]   Have you contacted your pharmacy to request a refill? Yes   Which pharmacy would you like this sent to?  [facility] [address]  Patient notified that their request is being sent to the clinical staff for review and that they should receive a response within 2 business days.   Please advise at [facility] [phone]  Pt is completely out of this med.

## 2021-12-31 ENCOUNTER — Other Ambulatory Visit: Payer: Self-pay | Admitting: Family Medicine

## 2021-12-31 DIAGNOSIS — Z8673 Personal history of transient ischemic attack (TIA), and cerebral infarction without residual deficits: Secondary | ICD-10-CM

## 2021-12-31 NOTE — Telephone Encounter (Signed)
Requested Prescriptions  Pending Prescriptions Disp Refills   XARELTO 15 MG TABS tablet [Pharmacy Med Name: XARELTO 15 MG TABLET] 30 tablet 5    Sig: TAKE 1 TABLET (15 MG TOTAL) BY MOUTH DAILY WITH SUPPER     Hematology: Anticoagulants - rivaroxaban Failed - 12/31/2021  3:00 AM      Failed - Cr in normal range and within 360 days    Creat  Date Value Ref Range Status  05/21/2021 1.77 (H) 0.60 - 0.95 mg/dL Final   Creatinine, Urine  Date Value Ref Range Status  05/21/2021 113 20 - 275 mg/dL Final         Failed - HCT in normal range and within 360 days    HCT  Date Value Ref Range Status  05/21/2021 33.3 (L) 35.0 - 45.0 % Final         Failed - HGB in normal range and within 360 days    Hemoglobin  Date Value Ref Range Status  05/21/2021 11.0 (L) 11.7 - 15.5 g/dL Final         Passed - ALT in normal range and within 360 days    ALT  Date Value Ref Range Status  05/21/2021 14 6 - 29 U/L Final         Passed - AST in normal range and within 360 days    AST  Date Value Ref Range Status  05/21/2021 23 10 - 35 U/L Final         Passed - PLT in normal range and within 360 days    Platelets  Date Value Ref Range Status  05/21/2021 167 140 - 400 Thousand/uL Final         Passed - eGFR is 15 or above and within 360 days    GFR, Est African American  Date Value Ref Range Status  07/14/2020 22 (L) > OR = 60 mL/min/1.84m Final   GFR, Est Non African American  Date Value Ref Range Status  07/14/2020 19 (L) > OR = 60 mL/min/1.741mFinal   GFR, Estimated  Date Value Ref Range Status  09/08/2020 37 (L) >60 mL/min Final    Comment:    (NOTE) Calculated using the CKD-EPI Creatinine Equation (2021)    GFR  Date Value Ref Range Status  09/24/2019 27.63 (L) >60.00 mL/min Final   eGFR  Date Value Ref Range Status  05/21/2021 28 (L) > OR = 60 mL/min/1.7350minal    Comment:    The eGFR is based on the CKD-EPI 2021 equation. To calculate  the new eGFR from a previous  Creatinine or Cystatin C result, go to https://www.kidney.org/professionals/ kdoqi/gfr%5Fcalculator          Passed - Patient is not pregnant      Passed - Valid encounter within last 12 months    Recent Outpatient Visits           7 months ago Hypothyroidism, unspecified type   [place] [place]ge[place]   1 year ago Hypothyroidism, unspecified type   [place]   [facility] year [facility]   [facility]   1 year ago Stage 3b chronic kidney disease ([place]ie McgeeD       Future Appointments  In 4 days Pickard, Cammie Mcgee, MD [place]

## 2022-01-04 ENCOUNTER — Encounter: Payer: Self-pay | Admitting: Family Medicine

## 2022-01-04 ENCOUNTER — Ambulatory Visit: Payer: Medicare Other | Admitting: Family Medicine

## 2022-01-04 VITALS — BP 112/62 | HR 59 | Ht 67.0 in | Wt 152.6 lb

## 2022-01-04 DIAGNOSIS — N1832 Chronic kidney disease, stage 3b: Secondary | ICD-10-CM

## 2022-01-04 NOTE — Progress Notes (Signed)
Subjective:    Patient ID: Jocelyn Moss, female    DOB: Jun 05, 1935, 86 y.o.   MRN: [id number]  HPI Patient complains of bilateral hearing loss and she supposes due to cerumen impaction.  Examination reveals bilateral cerumen impactions.  She is requesting that I remove these today. Past Medical History:  Diagnosis Date   Atrial fibrillation ([place])    Breast cancer ([place]) 1990   Chest pain    Colon cancer ([place])    ileostomy 2000   Dysrhythmia    History of colon cancer    Hypercholesterolemia    Hypothyroidism    Multiple lacunar infarcts (HCC)    MVP (mitral valve prolapse)    Osteoporosis    osteopenia   Personal history of chemotherapy    6 months   Personal history of radiation therapy    6 months   Renal cell carcinoma (HCC)    stage 1 s/p resection Dr. Tresa Moore 2020   SOB (shortness of breath)    Past Surgical History:  Procedure Laterality Date   BREAST BIOPSY     BREAST LUMPECTOMY Left    COLON SURGERY  2000   ileostomy   ILEOSTOMY     ROBOTIC ASSITED PARTIAL NEPHRECTOMY Right 02/01/2018   Procedure: XI ROBOTIC ASSISTED RETROPERITONEAL PARTIAL NEPHRECTOMY;  Surgeon: Alexis Frock, MD;  [facility];  Service: Urology;  Laterality: Right;   Current Outpatient Medications on File Prior to Visit  Medication Sig Dispense Refill   acetaminophen (TYLENOL) 500 MG tablet Take 500 mg by mouth every 6 (six) hours as needed (pain).     atorvastatin (LIPITOR) 20 MG tablet TAKE 1 TABLET BY MOUTH EVERY DAY 90 tablet 2   calcium citrate (CALCITRATE - DOSED IN MG ELEMENTAL CALCIUM) 950 (200 Ca) MG tablet Take 200 mg of elemental calcium by mouth daily. OTC     diltiazem (CARDIZEM) 30 MG tablet Take 1 tablet (30 mg total) by mouth 4 (four) times daily as needed (fast heart rate above 110). 90 tablet 0   DULERA 100-5 MCG/ACT AERO USE 2 INHALATIONS ORALLY   TWICE DAILY 39 g 3   ferrous gluconate (FERGON) 324 MG tablet Take 324 mg by mouth 2 (two) times daily.     fluticasone  (FLONASE) 50 MCG/ACT nasal spray Place into the nose.     fluticasone (FLONASE) 50 MCG/ACT nasal spray Place 2 sprays into both nostrils daily. (Patient not taking: Reported on 12/04/2021) 16 g 6   levothyroxine (SYNTHROID) 100 MCG tablet TAKE 1 TABLET (100 MCG TOTAL) BY MOUTH EVERY OTHER DAY. 45 tablet 3   levothyroxine (SYNTHROID) 112 MCG tablet TAKE 1 TABLET (112 MCG TOTAL) BY MOUTH EVERY OTHER DAY. 45 tablet 1   metoprolol tartrate (LOPRESSOR) 25 MG tablet TAKE 1/2 TABLET (12.5 MG TOTAL) BY MOUTH 2 (TWO) TIMES DAILY AS NEEDED (PALPITATIONS). 180 tablet 0   Multiple Vitamins-Minerals (PRESERVISION AREDS 2) CAPS Take 1 capsule by mouth 2 (two) times daily.     pantoprazole (PROTONIX) 40 MG tablet Take 1 tablet (40 mg total) by mouth daily. 90 tablet 1   promethazine (PHENERGAN) 25 MG tablet Take 1 tablet (25 mg total) by mouth every 8 (eight) hours as needed for nausea or vomiting. 20 tablet 0   Respiratory Therapy Supplies (FLUTTER) DEVI 1 Device by Does not apply route as directed. 1 each 0   XARELTO 15 MG TABS tablet TAKE 1 TABLET (15 MG TOTAL) BY MOUTH DAILY WITH SUPPER 30 tablet 5   No  current facility-administered medications on file prior to visit.   Allergies  Allergen Reactions   Crestor [Rosuvastatin Calcium] Other (See Comments)    Joint pain, effected liver function    Zetia [Ezetimibe] Other (See Comments)    Joint pain, effected liver function    Social History   Socioeconomic History   Marital status: Married    Spouse name: Not on file   Number of children: Not on file   Years of education: Not on file   Highest education level: Not on file  Occupational History   Not on file  Tobacco Use   Smoking status: Never   Smokeless tobacco: Never  Vaping Use   Vaping Use: Never used  Substance and Sexual Activity   Alcohol use: No   Drug use: No   Sexual activity: Not on file  Other Topics Concern   Not on file  Social History Narrative   Not on file   Social  Determinants of Health   Financial Resource Strain: Not on file  Food Insecurity: Not on file  Transportation Needs: Not on file  Physical Activity: Not on file  Stress: Not on file  Social Connections: Not on file  Intimate Partner Violence: Not on file       Review of Systems  All other systems reviewed and are negative.      Objective:   Physical Exam Vitals reviewed.  Constitutional:      General: She is not in acute distress.    Appearance: Normal appearance. She is normal weight. She is not ill-appearing or toxic-appearing.  HENT:     Right Ear: Decreased hearing noted. There is impacted cerumen.     Left Ear: Decreased hearing noted. There is impacted cerumen.  Cardiovascular:     Rate and Rhythm: Tachycardia present. Rhythm irregular.     Pulses: Normal pulses.     Heart sounds: Normal heart sounds. No murmur heard.    No friction rub. No gallop.  Pulmonary:     Effort: Pulmonary effort is normal. No respiratory distress.     Breath sounds: Normal breath sounds. No stridor.  Musculoskeletal:     Right lower leg: No edema.     Left lower leg: No edema.  Skin:    Findings: No rash.  Neurological:     Mental Status: She is alert.         Assessment & Plan:  Stage 3b chronic kidney disease ([place]) - Plan: CBC with Differential/Platelet, COMPLETE METABOLIC PANEL WITH GFR, Lipid panel, Phosphorus, PTH, Intact (ICMA) and Ionized Calcium, Protein / Creatinine Ratio, Urine Patient has cerumen impactions that were removed with irrigation and lavage.  She also has a history of stage IIIb chronic kidney disease.  I will check a CBC to monitor for anemia.  I will check a CMP to monitor her potassium, creatinine/GFR, and calcium.  Check a phosphorus level.  Check a PTH to evaluate for any signs secondary hyperparathyroidism secondary to renal osteodystrophy.  Check a urine protein to creatinine ratio.  However the labs will be present for the nephrologist she is seeing later  this month

## 2022-01-05 LAB — COMPLETE METABOLIC PANEL WITH GFR
AG Ratio: 1.2 (calc) (ref 1.0–2.5)
ALT: 17 U/L (ref 6–29)
AST: 24 U/L (ref 10–35)
Albumin: 4 g/dL (ref 3.6–5.1)
Alkaline phosphatase (APISO): 134 U/L (ref 37–153)
BUN/Creatinine Ratio: 19 (calc) (ref 6–22)
BUN: 34 mg/dL — ABNORMAL HIGH (ref 7–25)
CO2: 27 mmol/L (ref 20–32)
Calcium: 9.3 mg/dL (ref 8.6–10.4)
Chloride: 103 mmol/L (ref 98–110)
Creat: 1.82 mg/dL — ABNORMAL HIGH (ref 0.60–0.95)
Globulin: 3.3 g/dL (calc) (ref 1.9–3.7)
Glucose, Bld: 71 mg/dL (ref 65–99)
Potassium: 4.2 mmol/L (ref 3.5–5.3)
Sodium: 137 mmol/L (ref 135–146)
Total Bilirubin: 0.6 mg/dL (ref 0.2–1.2)
Total Protein: 7.3 g/dL (ref 6.1–8.1)
eGFR: 27 mL/min/{1.73_m2} — ABNORMAL LOW (ref >=60)

## 2022-01-05 LAB — CBC WITH DIFFERENTIAL/PLATELET
Absolute Monocytes: 572 cells/uL (ref 200–950)
Basophils Absolute: 32 cells/uL (ref 0–200)
Basophils Relative: 0.6 %
Eosinophils Absolute: 232 cells/uL (ref 15–500)
Eosinophils Relative: 4.3 %
HCT: 31.2 % — ABNORMAL LOW (ref 35.0–45.0)
Hemoglobin: 10.4 g/dL — ABNORMAL LOW (ref 11.7–15.5)
Lymphs Abs: 794 cells/uL — ABNORMAL LOW (ref 850–3900)
MCH: 31 pg (ref 27.0–33.0)
MCHC: 33.3 g/dL (ref 32.0–36.0)
MCV: 92.9 fL (ref 80.0–100.0)
MPV: 10.6 fL (ref 7.5–12.5)
Monocytes Relative: 10.6 %
Neutro Abs: 3769 cells/uL (ref 1500–7800)
Neutrophils Relative %: 69.8 %
Platelets: 162 10*3/uL (ref 140–400)
RBC: 3.36 10*6/uL — ABNORMAL LOW (ref 3.80–5.10)
RDW: 12.5 % (ref 11.0–15.0)
Total Lymphocyte: 14.7 %
WBC: 5.4 10*3/uL (ref 3.8–10.8)

## 2022-01-05 LAB — PHOSPHORUS: Phosphorus: 3.2 mg/dL (ref 2.1–4.3)

## 2022-01-05 LAB — PROTEIN / CREATININE RATIO, URINE
Creatinine, Urine: 122 mg/dL (ref 20–275)
Protein/Creat Ratio: 156 mg/g creat (ref 24–184)
Protein/Creatinine Ratio: 0.156 mg/mg creat (ref 0.024–0.184)
Total Protein, Urine: 19 mg/dL (ref 5–24)

## 2022-01-05 LAB — LIPID PANEL
Cholesterol: 135 mg/dL (ref <200)
HDL: 73 mg/dL (ref >=50)
LDL Cholesterol (Calc): 47 mg/dL (calc)
Non-HDL Cholesterol (Calc): 62 mg/dL (calc) (ref <130)
Total CHOL/HDL Ratio: 1.8 (calc) (ref <5.0)
Triglycerides: 72 mg/dL (ref <150)

## 2022-01-06 LAB — PTH, INTACT (ICMA) AND IONIZED CALCIUM
Calcium: 9.2 mg/dL (ref 8.6–10.4)
PTH: 146 pg/mL — ABNORMAL HIGH (ref 16–77)

## 2022-01-08 NOTE — Telephone Encounter (Signed)
[name] called, would like for you to review pt's PTH level and advice if meds need to be adjusted?  Pls advice

## 2022-01-08 NOTE — Telephone Encounter (Signed)
Spoke w/pt's daughter-Angela per pcp,"That is due to her CKD.  I will defer to her nephrologist "  Per daughter aware and nothing further.

## 2022-01-10 ENCOUNTER — Other Ambulatory Visit: Payer: Self-pay | Admitting: Family Medicine

## 2022-01-10 DIAGNOSIS — E038 Other specified hypothyroidism: Secondary | ICD-10-CM

## 2022-02-01 ENCOUNTER — Telehealth: Payer: Self-pay

## 2022-02-01 NOTE — Telephone Encounter (Signed)
Completed PA over phone for pt's [facility]. Case # [id number]. Turn around time 69 hours per Melissa. Mjp,lpn

## 2022-04-16 IMAGING — CT CT CHEST W/O CM
1 series · 14 of 34 positions shown, 18 images · non-contrast
Comparison: 05/18/2019 chest radiograph.

CLINICAL DATA: Pleural effusion. Cough. Follow-up of abnormal chest
radiograph. Nonsmoker. Left breast cancer 22 years ago with
chemotherapy and radiation therapy. Lumpectomy. Colon cancer in 7999
with colon resection. Right renal cancer in 9292 with partial
nephrectomy.

EXAM:
CT CHEST WITHOUT CONTRAST
TECHNIQUE: Multidetector CT imaging of the chest was performed following the
standard protocol without IV contrast.

[Series 2: chest w/(date) · axial · 0.64mm/px · z∈[+868,+1170]mm · 14 of 179 slices shown, 18 images]
[im 14/179  mediastinal]
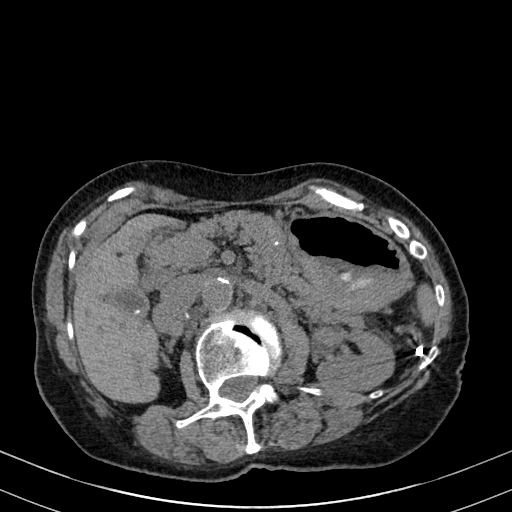
[im 14/179  lung]
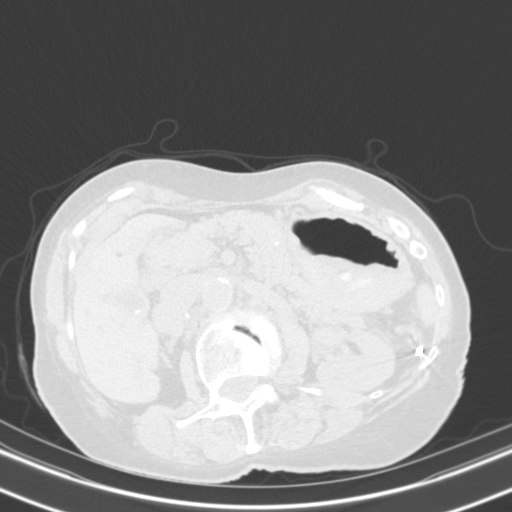
[im 27/179  lung]
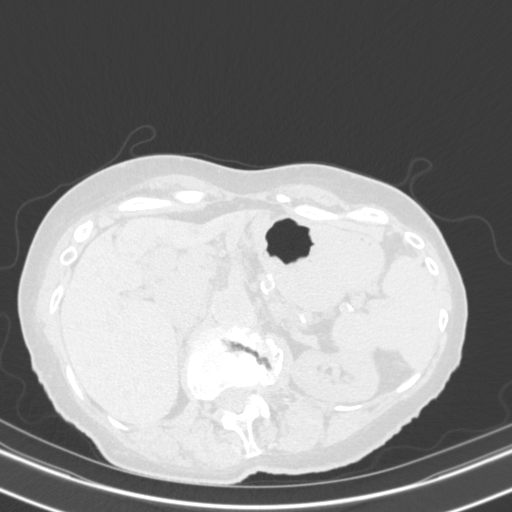
[im 36/179  lung]
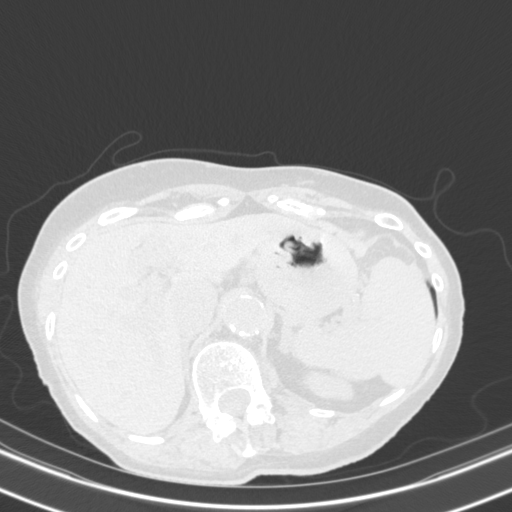
[im 53/179  lung]
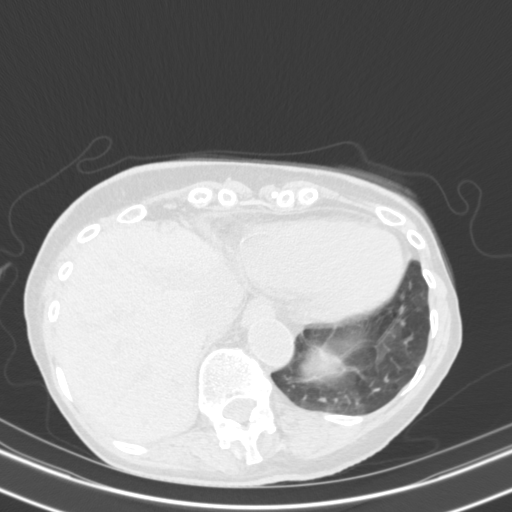
[im 66/179  mediastinal]
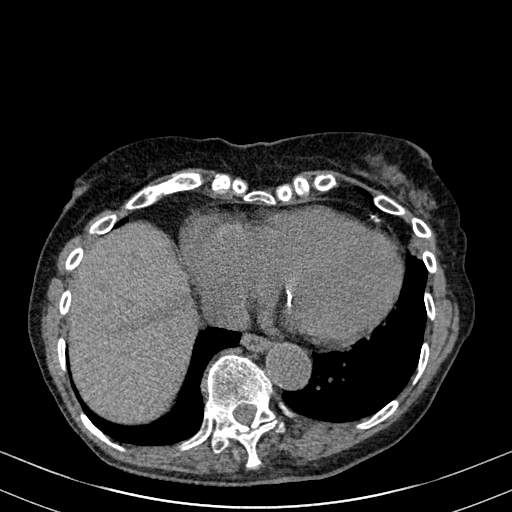
[im 66/179  lung]
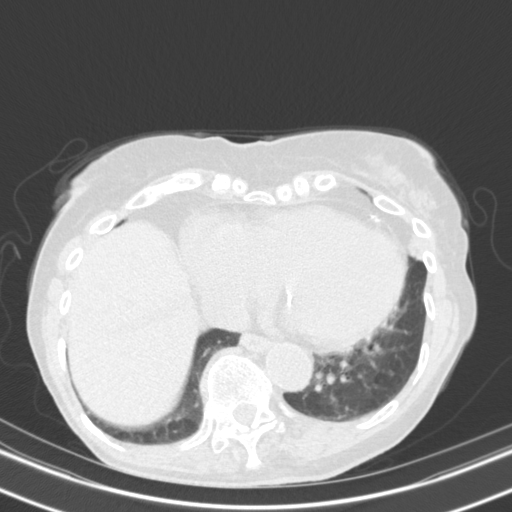
[im 73/179  lung]
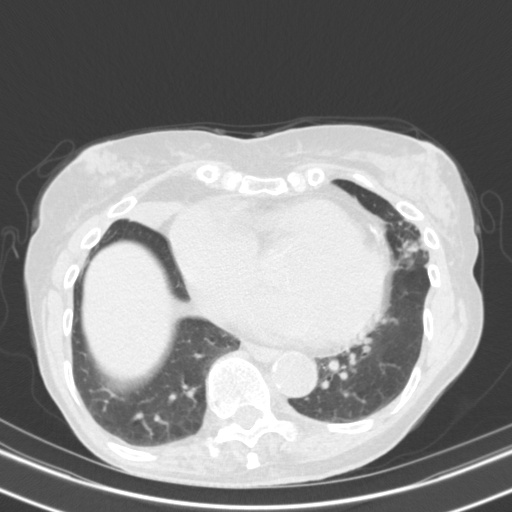
[im 85/179  lung]
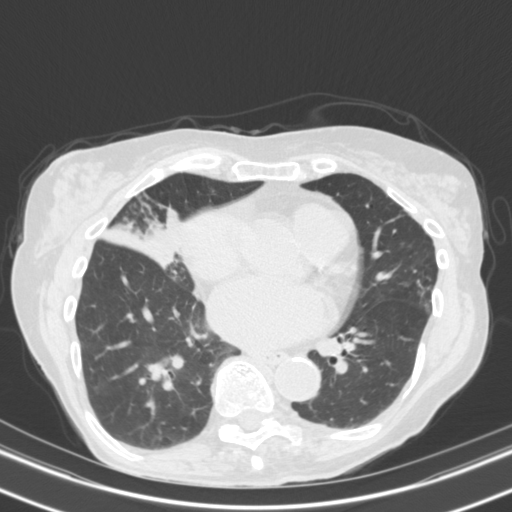
[im 95/179  lung]
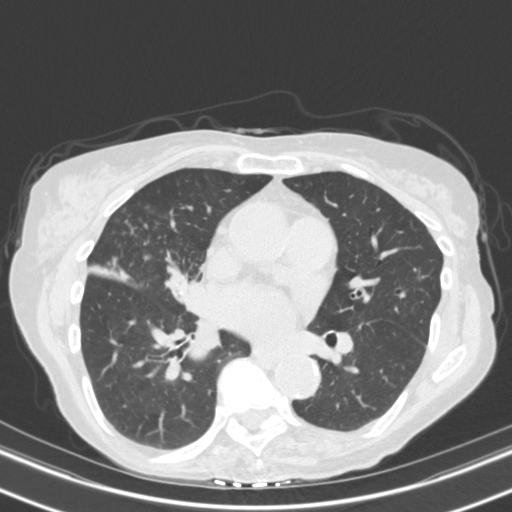
[im 106/179  mediastinal]
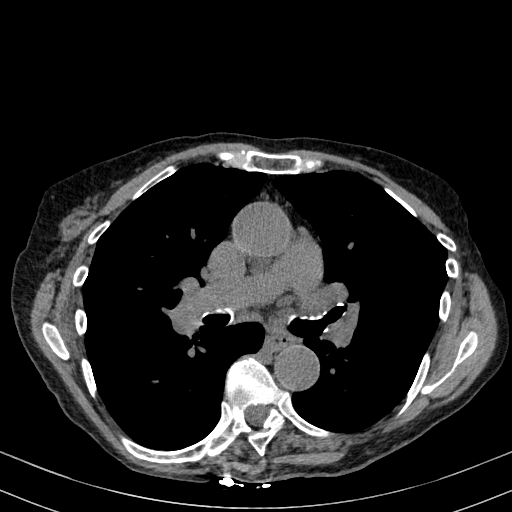
[im 106/179  lung]
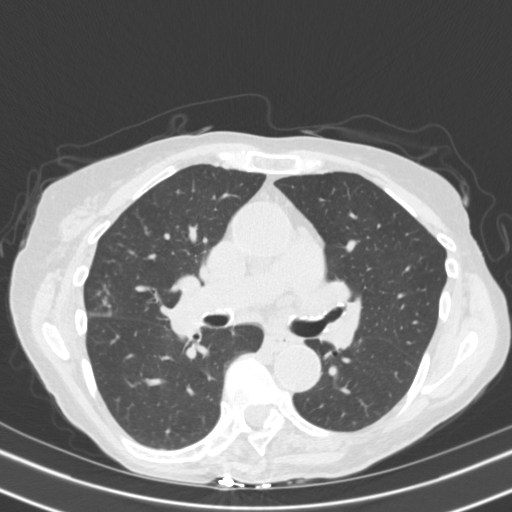
[im 113/179  lung]
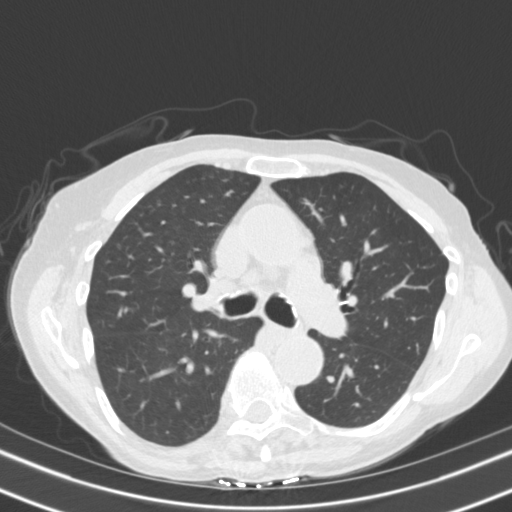
[im 132/179  lung]
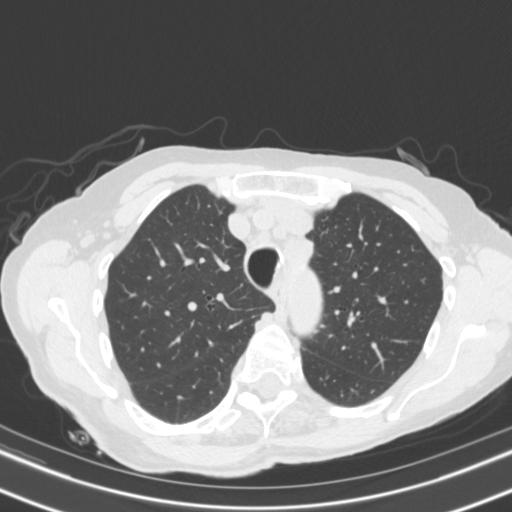
[im 143/179  lung]
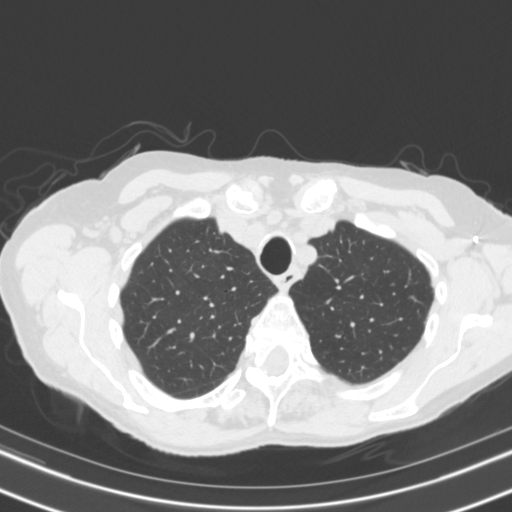
[im 152/179  mediastinal]
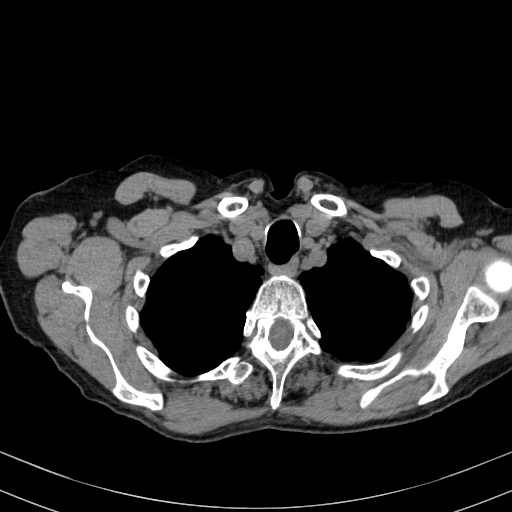
[im 152/179  lung]
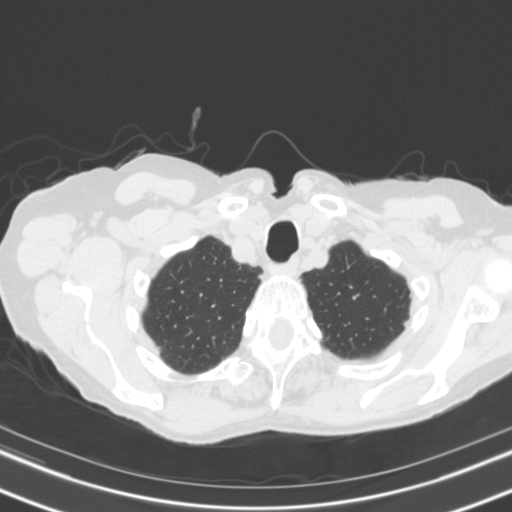
[im 165/179  lung]
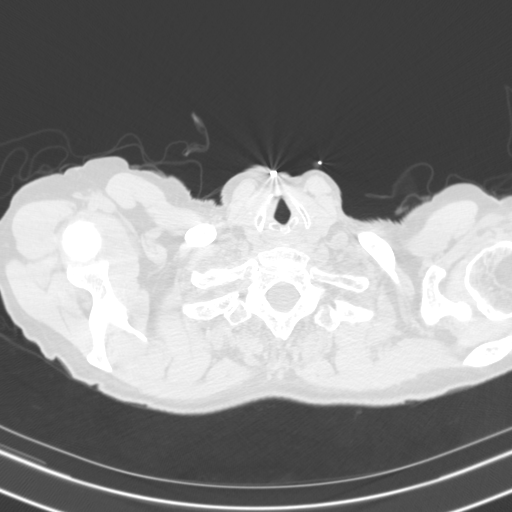

[14 of 34 positions shown; findings below may reference images not displayed]

FINDINGS: Cardiovascular: Aortic atherosclerosis. Heart size accentuated by a
pectus excavatum deformity. Multivessel coronary artery
atherosclerosis.

Mediastinum/Nodes: Left axillary node dissection. No mediastinal or
definite hilar adenopathy, given limitations of unenhanced CT.

Lungs/Pleura: No pleural fluid. Fluid/debris within the right middle
lobe bronchus, including on 80/3.

Posterior right middle lobe consolidation with surrounding areas of
micronodularity, mucoid impaction.

More mild inferior lingular mucoid impaction and peribronchovascular
nodularity, including on 108/3.

Left apical 3 mm nodular density is unchanged on 40/3, possibly
secondary to mucoid impaction.

Similar area of presumed mucoid impaction more posteriorly in the
left upper lobe on 45/3.

Upper Abdomen: Subcentimeter segment 4 B cyst. Normal imaged
portions of the spleen, stomach, pancreas, adrenal glands, kidneys.
Abdominal aortic atherosclerosis.

An anterior abdominal partially calcified 4.4 cm mass incompletely
imaged on 179/2, measures 3.8 cm on 11/18/2017 MRI.

Musculoskeletal: Convex right thoracic spine curvature. Lower
thoracic spondylosis.
IMPRESSION: 1. Right middle lobe and less so lingular areas of mucoid impaction,
peribronchovascular nodularity, with more confluent right middle
lobe consolidation and endobronchial material. Findings are favored
to represent chronic atypical infection, likely mycobacterium avium
intracellular. Especially given history of multiple malignancies,
consider endoscopy with special attention to the right middle lobe
bronchus.
2. No thoracic adenopathy.
3. Coronary artery atherosclerosis. Aortic Atherosclerosis
([id number]).
4. A chronic partially calcified and partially imaged anterior
abdominal mass is likely related to prior surgery, possibly
representing chronic hematoma or fat necrosis.

## 2022-05-06 ENCOUNTER — Other Ambulatory Visit: Payer: Self-pay | Admitting: Family Medicine

## 2022-05-06 NOTE — Telephone Encounter (Signed)
OV 01/04/22 Requested Prescriptions  Pending Prescriptions Disp Refills   pantoprazole (PROTONIX) 40 MG tablet [Pharmacy Med Name: PANTOPRAZOLE SOD DR 40 MG TAB] 90 tablet 1    Sig: TAKE 1 TABLET BY MOUTH EVERY DAY     Gastroenterology: Proton Pump Inhibitors Passed - 05/06/2022  1:50 AM      Passed - Valid encounter within last 12 months    Recent Outpatient Visits           11 months ago Hypothyroidism, unspecified type   [facility] Pickard, Priscille Heidelberg, MD   1 year ago Hypothyroidism, unspecified type   [facility] Donita Brooks, MD   1 year ago Persistent atrial fibrillation [facility])   [facility] Tanya Nones, Priscille Heidelberg, MD   1 year ago Persistent atrial fibrillation [facility])   [facility] Valentino Nose, NP   1 year ago Stage 3b chronic kidney disease (HCC)   [facility] Pickard, Priscille Heidelberg, MD

## 2022-06-06 ENCOUNTER — Other Ambulatory Visit: Payer: Self-pay | Admitting: Family Medicine

## 2022-06-06 DIAGNOSIS — E038 Other specified hypothyroidism: Secondary | ICD-10-CM

## 2022-06-07 NOTE — Telephone Encounter (Signed)
Requested Prescriptions  Pending Prescriptions Disp Refills   levothyroxine (SYNTHROID) 100 MCG tablet [Pharmacy Med Name: LEVOTHYROXINE 100 MCG TABLET] 45 tablet 3    Sig: TAKE 1 TABLET (100 MCG TOTAL) BY MOUTH EVERY OTHER DAY.     Endocrinology:  Hypothyroid Agents Failed - 06/06/2022  9:22 AM      Failed - TSH in normal range and within 360 days    TSH  Date Value Ref Range Status  05/21/2021 3.36 0.40 - 4.50 mIU/L Final         Failed - Valid encounter within last 12 months    Recent Outpatient Visits           1 year ago Hypothyroidism, unspecified type   [facility] Medicine Pickard, Priscille Heidelberg, MD   1 year ago Hypothyroidism, unspecified type   [facility] Medicine Pickard, Priscille Heidelberg, MD   1 year ago Persistent atrial fibrillation [facility])   [facility] Tanya Nones, Priscille Heidelberg, MD   1 year ago Persistent atrial fibrillation [facility])   [facility] Valentino Nose, NP   1 year ago Stage 3b chronic kidney disease (HCC)   [facility] Pickard, Priscille Heidelberg, MD               atorvastatin (LIPITOR) 20 MG tablet [Pharmacy Med Name: ATORVASTATIN 20 MG TABLET] 90 tablet 1    Sig: TAKE 1 TABLET BY MOUTH EVERY DAY     Cardiovascular:  Antilipid - Statins Failed - 06/06/2022  9:22 AM      Failed - Valid encounter within last 12 months    Recent Outpatient Visits           1 year ago Hypothyroidism, unspecified type   [facility] Medicine Pickard, Priscille Heidelberg, MD   1 year ago Hypothyroidism, unspecified type   [facility] Medicine Pickard, Priscille Heidelberg, MD   1 year ago Persistent atrial fibrillation [facility])   [facility] Pickard, Priscille Heidelberg, MD   1 year ago Persistent atrial fibrillation [facility])   [facility] Valentino Nose, NP   1 year ago Stage 3b chronic kidney disease (HCC)   [facility] Donita Brooks, MD              Failed - Lipid Panel in normal  range within the last 12 months    Cholesterol  Date Value Ref Range Status  01/04/2022 135 <200 mg/dL Final   LDL Cholesterol (Calc)  Date Value Ref Range Status  01/04/2022 47 mg/dL (calc) Final    Comment:    Reference range: <100 . Desirable range <100 mg/dL for primary prevention;   <70 mg/dL for patients with CHD or diabetic patients  with > or = 2 CHD risk factors. Marland Kitchen LDL-C is now calculated using the Martin-Hopkins  calculation, which is a validated novel method providing  better accuracy than the Friedewald equation in the  estimation of LDL-C.  Horald Pollen et al. Lenox Ahr. 2956;213(08): 2061-2068  (http://education.[facility].com/faq/FAQ164)    Direct LDL  Date Value Ref Range Status  05/25/2011 151.1 mg/dL Final    Comment:    Optimal:  <100 mg/dLNear or Above Optimal:  100-129 mg/dLBorderline High:  130-159 mg/dLHigh:  160-189 mg/dLVery High:  >190 mg/dL   HDL  Date Value Ref Range Status  01/04/2022 73 > OR = 50 mg/dL Final   Triglycerides  Date Value Ref Range Status  01/04/2022 72 <150  mg/dL Final         Passed - Patient is not pregnant

## 2022-06-07 NOTE — Telephone Encounter (Signed)
Requested medications are due for refill today.  yes  Requested medications are on the active medications list.  yes  Last refill. 05/22/2021 #45 3 rf  Future visit scheduled.   no  Notes to clinic.  Labs are expired.    Requested Prescriptions  Pending Prescriptions Disp Refills   levothyroxine (SYNTHROID) 100 MCG tablet [Pharmacy Med Name: LEVOTHYROXINE 100 MCG TABLET] 45 tablet 3    Sig: TAKE 1 TABLET (100 MCG TOTAL) BY MOUTH EVERY OTHER DAY.     Endocrinology:  Hypothyroid Agents Failed - 06/06/2022  9:22 AM      Failed - TSH in normal range and within 360 days    TSH  Date Value Ref Range Status  05/21/2021 3.36 0.40 - 4.50 mIU/L Final         Failed - Valid encounter within last 12 months    Recent Outpatient Visits           1 year ago Hypothyroidism, unspecified type   [facility] Medicine Pickard, Priscille Heidelberg, MD   1 year ago Hypothyroidism, unspecified type   [facility] Medicine Pickard, Priscille Heidelberg, MD   1 year ago Persistent atrial fibrillation [facility])   [facility] Tanya Nones, Priscille Heidelberg, MD   1 year ago Persistent atrial fibrillation [facility])   [facility] Medicine Valentino Nose, NP   1 year ago Stage 3b chronic kidney disease (HCC)   [facility] Pickard, Priscille Heidelberg, MD              Signed Prescriptions Disp Refills   atorvastatin (LIPITOR) 20 MG tablet 90 tablet 1    Sig: TAKE 1 TABLET BY MOUTH EVERY DAY     Cardiovascular:  Antilipid - Statins Failed - 06/06/2022  9:22 AM      Failed - Valid encounter within last 12 months    Recent Outpatient Visits           1 year ago Hypothyroidism, unspecified type   [facility] Medicine Pickard, Priscille Heidelberg, MD   1 year ago Hypothyroidism, unspecified type   [facility] Medicine Pickard, Priscille Heidelberg, MD   1 year ago Persistent atrial fibrillation [facility])   [facility] Medicine Pickard, Priscille Heidelberg, MD   1 year ago Persistent atrial fibrillation  [facility])   [facility] Medicine Valentino Nose, NP   1 year ago Stage 3b chronic kidney disease (HCC)   [facility] Medicine Donita Brooks, MD              Failed - Lipid Panel in normal range within the last 12 months    Cholesterol  Date Value Ref Range Status  01/04/2022 135 <200 mg/dL Final   LDL Cholesterol (Calc)  Date Value Ref Range Status  01/04/2022 47 mg/dL (calc) Final    Comment:    Reference range: <100 . Desirable range <100 mg/dL for primary prevention;   <70 mg/dL for patients with CHD or diabetic patients  with > or = 2 CHD risk factors. Marland Kitchen LDL-C is now calculated using the Martin-Hopkins  calculation, which is a validated novel method providing  better accuracy than the Friedewald equation in the  estimation of LDL-C.  Horald Pollen et al. Lenox Ahr. 1610;960(45): 2061-2068  (http://education.[facility].com/faq/FAQ164)    Direct LDL  Date Value Ref Range Status  05/25/2011 151.1 mg/dL Final    Comment:    Optimal:  <100 mg/dLNear or Above Optimal:  100-129  mg/dLBorderline High:  130-159 mg/dLHigh:  160-189 mg/dLVery High:  >190 mg/dL   HDL  Date Value Ref Range Status  01/04/2022 73 > OR = 50 mg/dL Final   Triglycerides  Date Value Ref Range Status  01/04/2022 72 <150 mg/dL Final         Passed - Patient is not pregnant

## 2022-06-17 ENCOUNTER — Other Ambulatory Visit: Payer: Self-pay | Admitting: Family Medicine

## 2022-06-17 DIAGNOSIS — E038 Other specified hypothyroidism: Secondary | ICD-10-CM

## 2022-06-22 ENCOUNTER — Other Ambulatory Visit: Payer: Self-pay

## 2022-06-22 DIAGNOSIS — E038 Other specified hypothyroidism: Secondary | ICD-10-CM

## 2022-06-22 MED ORDER — LEVOTHYROXINE SODIUM 112 MCG PO TABS: 112.0000 ug | ORAL_TABLET | ORAL | 1 refills | Status: DC

## 2022-06-26 ENCOUNTER — Other Ambulatory Visit: Payer: Self-pay | Admitting: Family Medicine

## 2022-06-26 DIAGNOSIS — Z8673 Personal history of transient ischemic attack (TIA), and cerebral infarction without residual deficits: Secondary | ICD-10-CM

## 2022-06-28 NOTE — Telephone Encounter (Signed)
Requested Prescriptions  Pending Prescriptions Disp Refills   XARELTO 15 MG TABS tablet [Pharmacy Med Name: XARELTO 15 MG TABLET] 90 tablet 0    Sig: TAKE 1 TABLET (15 MG TOTAL) BY MOUTH DAILY WITH SUPPER     Hematology: Anticoagulants - rivaroxaban Failed - 06/26/2022  8:42 AM      Failed - Cr in normal range and within 360 days    Creat  Date Value Ref Range Status  01/04/2022 1.82 (H) 0.60 - 0.95 mg/dL Final   Creatinine, Urine  Date Value Ref Range Status  01/04/2022 122 20 - 275 mg/dL Final         Failed - HCT in normal range and within 360 days    HCT  Date Value Ref Range Status  01/04/2022 31.2 (L) 35.0 - 45.0 % Final         Failed - HGB in normal range and within 360 days    Hemoglobin  Date Value Ref Range Status  01/04/2022 10.4 (L) 11.7 - 15.5 g/dL Final         Failed - Valid encounter within last 12 months    Recent Outpatient Visits           1 year ago Hypothyroidism, unspecified type   [facility] Medicine Donita Brooks, MD   1 year ago Hypothyroidism, unspecified type   [facility] Medicine Donita Brooks, MD   1 year ago Persistent atrial fibrillation (HCC)   [facility] Medicine Pickard, Priscille Heidelberg, MD   1 year ago Persistent atrial fibrillation (HCC)   [facility] Medicine Valentino Nose, NP   1 year ago Stage 3b chronic kidney disease (HCC)   [facility] Medicine Pickard, Priscille Heidelberg, MD              Passed - ALT in normal range and within 360 days    ALT  Date Value Ref Range Status  01/04/2022 17 6 - 29 U/L Final         Passed - AST in normal range and within 360 days    AST  Date Value Ref Range Status  01/04/2022 24 10 - 35 U/L Final         Passed - PLT in normal range and within 360 days    Platelets  Date Value Ref Range Status  01/04/2022 162 140 - 400 Thousand/uL Final         Passed - eGFR is 15 or above and within 360 days    GFR, Est African American  Date Value Ref  Range Status  07/14/2020 22 (L) > OR = 60 mL/min/1.40m2 Final   GFR, Est Non African American  Date Value Ref Range Status  07/14/2020 19 (L) > OR = 60 mL/min/1.42m2 Final   GFR, Estimated  Date Value Ref Range Status  09/08/2020 37 (L) >60 mL/min Final    Comment:    (NOTE) Calculated using the CKD-EPI Creatinine Equation (2021)    GFR  Date Value Ref Range Status  09/24/2019 27.63 (L) >60.00 mL/min Final   eGFR  Date Value Ref Range Status  01/04/2022 27 (L) > OR = 60 mL/min/1.50m2 Final         Passed - Patient is not pregnant

## 2022-07-26 ENCOUNTER — Encounter: Payer: Self-pay | Admitting: Ophthalmology

## 2022-07-27 ENCOUNTER — Other Ambulatory Visit: Payer: Medicare Other

## 2022-07-27 ENCOUNTER — Encounter: Payer: Self-pay | Admitting: Ophthalmology

## 2022-07-27 DIAGNOSIS — Z905 Acquired absence of kidney: Secondary | ICD-10-CM

## 2022-07-27 DIAGNOSIS — D631 Anemia in chronic kidney disease: Secondary | ICD-10-CM

## 2022-07-27 DIAGNOSIS — N1832 Chronic kidney disease, stage 3b: Secondary | ICD-10-CM

## 2022-07-27 DIAGNOSIS — E039 Hypothyroidism, unspecified: Secondary | ICD-10-CM

## 2022-07-27 LAB — CBC WITH DIFFERENTIAL/PLATELET: Neutro Abs: 3379 cells/uL (ref 1500–7800)

## 2022-07-27 NOTE — Anesthesia Preprocedure Evaluation (Addendum)
Anesthesia Evaluation  Patient identified by MRN, date of birth, ID band Patient awake    Reviewed: Allergy & Precautions, H&P , NPO status , Patient's Chart, lab work & pertinent test results, reviewed documented beta blocker date and time   Airway Mallampati: III  TM Distance: >3 FB Neck ROM: Full    Dental no notable dental hx.    Pulmonary neg pulmonary ROS, asthma    Pulmonary exam normal breath sounds clear to auscultation       Cardiovascular hypertension, Pt. on home beta blockers and Pt. on medications negative cardio ROS Normal cardiovascular exam+ dysrhythmias Atrial Fibrillation  Rhythm:Regular Rate:Normal  09-06-20 1. Left ventricular ejection fraction, by estimation, is 60 to 65%. The  left ventricle has normal function. The left ventricle has no regional  wall motion abnormalities. Left ventricular diastolic parameters were  normal.   2. Right ventricular systolic function is normal. The right ventricular  size is normal.   3. Left atrial size was moderately dilated.   4. Right atrial size was moderately dilated.   5. The mitral valve is normal in structure. Mild mitral valve  regurgitation. No evidence of mitral stenosis.   6. The aortic valve is tricuspid. Aortic valve regurgitation is not  visualized. Mild aortic valve sclerosis is present, with no evidence of  aortic valve stenosis.   7. The inferior vena cava is normal in size with greater than 50%  respiratory variability, suggesting right atrial pressure of 3 mmHg.     Neuro/Psych CVA  negative psych ROS   GI/Hepatic negative GI ROS, Neg liver ROS,,,  Endo/Other  negative endocrine ROSHypothyroidism    Renal/GU Renal diseasenegative Renal ROSHx partial nephrectomy for renal cancer, stage 3b CKD  negative genitourinary   Musculoskeletal negative musculoskeletal ROS (+)    Abdominal   Peds negative pediatric ROS (+) Delivery details -  Hematology negative hematology ROS (+) Blood dyscrasia, anemia   Anesthesia Other Findings MVP (mitral valve prolapse) Hypercholesterolemia History of colon cancer  Hypothyroidism Chest pain  Atrial fibrillation (HCC) SOB (shortness of breath)  Breast cancer (HCC) Colon cancer (HCC)  Osteoporosis Multiple lacunar infarcts (HCC) Personal history of radiation therapy Personal history of chemotherapy Dysrhythmia Renal cell carcinoma (HCC)  History of CVA (cerebrovascular accident) Stage 3b chronic kidney disease (CKD) Mild mitral regurgitation by prior echocardiogram    Reproductive/Obstetrics negative OB ROS                             Anesthesia Physical Anesthesia Plan  ASA: 3  Anesthesia Plan: MAC   Post-op Pain Management:    Induction: Intravenous  PONV Risk Score and Plan:   Airway Management Planned: Natural Airway and Nasal Cannula  Additional Equipment:   Intra-op Plan:   Post-operative Plan:   Informed Consent: I have reviewed the patients History and Physical, chart, labs and discussed the procedure including the risks, benefits and alternatives for the proposed anesthesia with the patient or authorized representative who has indicated his/her understanding and acceptance.     Dental Advisory Given  Plan Discussed with: Anesthesiologist, CRNA and Surgeon  Anesthesia Plan Comments: (Patient consented for risks of anesthesia including but not limited to:  - adverse reactions to medications - damage to eyes, teeth, lips or other oral mucosa - nerve damage due to positioning  - sore throat or hoarseness - Damage to heart, brain, nerves, lungs, other parts of body or loss of life  Patient voiced  understanding.)       Anesthesia Quick Evaluation

## 2022-07-28 LAB — BASIC METABOLIC PANEL
BUN/Creatinine Ratio: 20 (calc) (ref 6–22)
BUN: 44 mg/dL — ABNORMAL HIGH (ref 7–25)
CO2: 23 mmol/L (ref 20–32)
Calcium: 9.2 mg/dL (ref 8.6–10.4)
Chloride: 103 mmol/L (ref 98–110)
Creat: 2.17 mg/dL — ABNORMAL HIGH (ref 0.60–0.95)
Glucose, Bld: 116 mg/dL — ABNORMAL HIGH (ref 65–99)
Potassium: 4.1 mmol/L (ref 3.5–5.3)
Sodium: 135 mmol/L (ref 135–146)

## 2022-07-28 LAB — CBC WITH DIFFERENTIAL/PLATELET
Absolute Monocytes: 461 {cells}/uL (ref 200–950)
Basophils Absolute: 29 {cells}/uL (ref 0–200)
Basophils Relative: 0.6 %
Eosinophils Absolute: 221 cells/uL (ref 15–500)
Eosinophils Relative: 4.6 %
HCT: 31.2 % — ABNORMAL LOW (ref 35.0–45.0)
Hemoglobin: 10.2 g/dL — ABNORMAL LOW (ref 11.7–15.5)
Lymphs Abs: 710 {cells}/uL — ABNORMAL LOW (ref 850–3900)
MCH: 30.8 pg (ref 27.0–33.0)
MCHC: 32.7 g/dL (ref 32.0–36.0)
MCV: 94.3 fL (ref 80.0–100.0)
MPV: 10.1 fL (ref 7.5–12.5)
Monocytes Relative: 9.6 %
Neutrophils Relative %: 70.4 %
Platelets: 156 10*3/uL (ref 140–400)
RBC: 3.31 10*6/uL — ABNORMAL LOW (ref 3.80–5.10)
RDW: 12.6 % (ref 11.0–15.0)
Total Lymphocyte: 14.8 %
WBC: 4.8 10*3/uL (ref 3.8–10.8)

## 2022-07-28 LAB — TSH: TSH: 2.5 mIU/L (ref 0.40–4.50)

## 2022-07-30 ENCOUNTER — Encounter: Payer: Self-pay | Admitting: Family Medicine

## 2022-07-30 ENCOUNTER — Ambulatory Visit: Payer: Medicare Other | Admitting: Family Medicine

## 2022-07-30 VITALS — BP 102/58 | HR 68 | Temp 97.5°F | Ht 67.0 in | Wt 150.0 lb

## 2022-07-30 DIAGNOSIS — E039 Hypothyroidism, unspecified: Secondary | ICD-10-CM

## 2022-07-30 DIAGNOSIS — I4891 Unspecified atrial fibrillation: Secondary | ICD-10-CM

## 2022-07-30 DIAGNOSIS — N1832 Chronic kidney disease, stage 3b: Secondary | ICD-10-CM

## 2022-07-30 MED ORDER — PROMETHAZINE HCL 25 MG PO TABS: 25.0000 mg | ORAL_TABLET | Freq: Three times a day (TID) | ORAL | 0 refills | Status: DC | PRN

## 2022-07-30 NOTE — Progress Notes (Signed)
Subjective:    Patient ID: Jocelyn Moss, female    DOB: [birth year], 87 y.o.   MRN: [id number]  HPI Patient has stage IIIb chronic kidney disease, atrial fibrillation, and hypothyroidism.  She is here today for follow-up.  Her most recent lab work is listed below Lab on 07/27/2022  Component Date Value Ref Range Status   TSH 07/27/2022 2.50  0.40 - 4.50 mIU/L Final   WBC 07/27/2022 4.8  3.8 - 10.8 Thousand/uL Final   RBC 07/27/2022 3.31 (L)  3.80 - 5.10 Million/uL Final   Hemoglobin 07/27/2022 10.2 (L)  11.7 - 15.5 g/dL Final   HCT 40/98/1191 31.2 (L)  35.0 - 45.0 % Final   MCV 07/27/2022 94.3  80.0 - 100.0 fL Final   MCH 07/27/2022 30.8  27.0 - 33.0 pg Final   MCHC 07/27/2022 32.7  32.0 - 36.0 g/dL Final   RDW 47/82/9562 12.6  11.0 - 15.0 % Final   Platelets 07/27/2022 156  140 - 400 Thousand/uL Final   MPV 07/27/2022 10.1  7.5 - 12.5 fL Final   Neutro Abs 07/27/2022 3,379  1,500 - 7,800 cells/uL Final   Lymphs Abs 07/27/2022 710 (L)  850 - 3,900 cells/uL Final   Absolute Monocytes 07/27/2022 461  200 - 950 cells/uL Final   Eosinophils Absolute 07/27/2022 221  15 - 500 cells/uL Final   Basophils Absolute 07/27/2022 29  0 - 200 cells/uL Final   Neutrophils Relative % 07/27/2022 70.4  % Final   Total Lymphocyte 07/27/2022 14.8  % Final   Monocytes Relative 07/27/2022 9.6  % Final   Eosinophils Relative 07/27/2022 4.6  % Final   Basophils Relative 07/27/2022 0.6  % Final   Glucose, Bld 07/27/2022 116 (H)  65 - 99 mg/dL Final   Comment: .            Fasting reference interval . For someone without known diabetes, a glucose value between 100 and 125 mg/dL is consistent with prediabetes and should be confirmed with a follow-up test. .    BUN 07/27/2022 44 (H)  7 - 25 mg/dL Final   Creat 13/08/6576 2.17 (H)  0.60 - 0.95 mg/dL Final   BUN/Creatinine Ratio 07/27/2022 20  6 - 22 (calc) Final   Sodium 07/27/2022 135  135 - 146 mmol/L Final   Potassium 07/27/2022 4.1  3.5 - 5.3  mmol/L Final   Chloride 07/27/2022 103  98 - 110 mmol/L Final   CO2 07/27/2022 23  20 - 32 mmol/L Final   Calcium 07/27/2022 9.2  8.6 - 10.4 mg/dL Final   She denies any chest pain shortness of breath or dyspnea on exertion.  She does occasionally have some dizziness when she stands up suggesting orthostatic hypotension.  Her creatinine has risen from 1.8-2.2 recently.  She has been working in her garden outside in the heat more often and may be slightly dehydrated.  Her chronic anemia is stable.  She has not been performing her incentive spirometry or flutter valve as often as she used to.  She has a history of bronchiectasis coupled with COPD.  She is using her dulera twice daily and denies any recent exacerbations.  She denies any falls.   Past Medical History:  Diagnosis Date   Atrial fibrillation (HCC)    Breast cancer (HCC) 1990   Chest pain    Colon cancer (HCC)    ileostomy 2000   Dysrhythmia    History of colon cancer    History of  CVA (cerebrovascular accident)    Hypercholesterolemia    Hypothyroidism    Mild mitral regurgitation by prior echocardiogram    Multiple lacunar infarcts (HCC)    MVP (mitral valve prolapse)    Osteoporosis    osteopenia   Personal history of chemotherapy    6 months   Personal history of radiation therapy    6 months   Renal cell carcinoma (HCC)    stage 1 s/p resection Dr. Berneice Heinrich 2020   SOB (shortness of breath)    Stage 3b chronic kidney disease (CKD) (HCC)    Past Surgical History:  Procedure Laterality Date   BREAST BIOPSY     BREAST LUMPECTOMY Left    COLON SURGERY  2000   ileostomy   ILEOSTOMY     ROBOTIC ASSITED PARTIAL NEPHRECTOMY Right 02/01/2018   Procedure: XI ROBOTIC ASSISTED RETROPERITONEAL PARTIAL NEPHRECTOMY;  Surgeon: Sebastian Ache, MD;  Location: [facility];  Service: Urology;  Laterality: Right;   Current Outpatient Medications on File Prior to Visit  Medication Sig Dispense Refill   acetaminophen (TYLENOL) 500 MG tablet  Take 500 mg by mouth every 6 (six) hours as needed (pain).     atorvastatin (LIPITOR) 20 MG tablet TAKE 1 TABLET BY MOUTH EVERY DAY 90 tablet 1   calcitRIOL (ROCALTROL) 0.25 MCG capsule Take 0.25 mcg by mouth daily. Take 1 by mouth every other day     calcium citrate (CALCITRATE - DOSED IN MG ELEMENTAL CALCIUM) 950 (200 Ca) MG tablet Take 200 mg of elemental calcium by mouth daily. OTC     diltiazem (CARDIZEM) 30 MG tablet Take 1 tablet (30 mg total) by mouth 4 (four) times daily as needed (fast heart rate above 110). 90 tablet 0   DULERA 100-5 MCG/ACT AERO USE 2 INHALATIONS ORALLY   TWICE DAILY 39 g 3   ferrous gluconate (FERGON) 324 MG tablet Take 324 mg by mouth 2 (two) times daily.     fluticasone (FLONASE) 50 MCG/ACT nasal spray Place into the nose.     levothyroxine (SYNTHROID) 100 MCG tablet TAKE 1 TABLET (100 MCG TOTAL) BY MOUTH EVERY OTHER DAY. 45 tablet 3   levothyroxine (SYNTHROID) 112 MCG tablet Take 1 tablet (112 mcg total) by mouth every other day. 45 tablet 1   metoprolol tartrate (LOPRESSOR) 25 MG tablet TAKE 1/2 TABLET (12.5 MG TOTAL) BY MOUTH 2 (TWO) TIMES DAILY AS NEEDED (PALPITATIONS). 180 tablet 0   Multiple Vitamins-Minerals (PRESERVISION AREDS 2) CAPS Take 1 capsule by mouth 2 (two) times daily.     pantoprazole (PROTONIX) 40 MG tablet TAKE 1 TABLET BY MOUTH EVERY DAY 90 tablet 1   Respiratory Therapy Supplies (FLUTTER) DEVI 1 Device by Does not apply route as directed. 1 each 0   Rivaroxaban (XARELTO) 15 MG TABS tablet TAKE 1 TABLET (15 MG TOTAL) BY MOUTH DAILY WITH SUPPER 90 tablet 0   fluticasone (FLONASE) 50 MCG/ACT nasal spray Place 2 sprays into both nostrils daily. (Patient not taking: Reported on 12/04/2021) 16 g 6   No current facility-administered medications on file prior to visit.   Allergies  Allergen Reactions   Crestor [Rosuvastatin Calcium] Other (See Comments)    Joint pain, effected liver function    Zetia [Ezetimibe] Other (See Comments)    Joint  pain, effected liver function    Social History   Socioeconomic History   Marital status: Married    Spouse name: Not on file   Number of children: Not on file   Years  of education: Not on file   Highest education level: Not on file  Occupational History   Not on file  Tobacco Use   Smoking status: Never   Smokeless tobacco: Never  Vaping Use   Vaping Use: Never used  Substance and Sexual Activity   Alcohol use: No   Drug use: No   Sexual activity: Not on file  Other Topics Concern   Not on file  Social History Narrative   Not on file   Social Determinants of Health   Financial Resource Strain: Not on file  Food Insecurity: Not on file  Transportation Needs: Not on file  Physical Activity: Not on file  Stress: Not on file  Social Connections: Not on file  Intimate Partner Violence: Not on file       Review of Systems  All other systems reviewed and are negative.      Objective:   Physical Exam Vitals reviewed.  Constitutional:      General: She is not in acute distress.    Appearance: Normal appearance. She is normal weight. She is not ill-appearing or toxic-appearing.  HENT:     Mouth/Throat:     Mouth: Mucous membranes are moist.  Eyes:     Conjunctiva/sclera: Conjunctivae normal.     Pupils: Pupils are equal, round, and reactive to light.  Cardiovascular:     Rate and Rhythm: Normal rate. Rhythm irregular.     Pulses: Normal pulses.     Heart sounds: Normal heart sounds. No murmur heard.    No friction rub. No gallop.  Pulmonary:     Effort: Pulmonary effort is normal. No respiratory distress.     Breath sounds: Normal breath sounds. No stridor.  Abdominal:     General: Abdomen is flat. Bowel sounds are normal. There is no distension.     Tenderness: There is no abdominal tenderness. There is no guarding.  Musculoskeletal:     Right lower leg: No edema.     Left lower leg: No edema.  Skin:    Findings: No rash.  Neurological:     General:  No focal deficit present.     Mental Status: She is alert. Mental status is at baseline.     Cranial Nerves: No cranial nerve deficit.     Sensory: No sensory deficit.     Motor: No weakness.     Coordination: Coordination normal.     Gait: Gait normal.    EKG shows normal sinus rhythm with frequent PACs.  Patient's average heart rate is in the 60s.  When I palpate her pulse for 30 seconds her heart rate is averaging in the 80s but she is having frequent PACs.       Assessment & Plan:  Stage 3b chronic kidney disease (HCC)  Atrial fibrillation, unspecified type (HCC)  Hypothyroidism, unspecified type Thyroid is well-managed alternating 100-125 mcg every other day of levothyroxine.  Heart rate is appropriately controlled with metoprolol.  The patient is appropriately anticoagulated with Xarelto.  Anemia stable.  Encouraged her to try to drink more water she appears slightly dehydrated today.  Also recommended that she stand slowly to prevent orthostatic drops in her blood pressure and falls.  Otherwise she is doing well.  Encouraged incentive spirometry and flutter valve for her bronchiectasis.

## 2022-08-02 NOTE — Discharge Instructions (Signed)

## 2022-08-04 ENCOUNTER — Ambulatory Visit
Admission: RE | Admit: 2022-08-04 | Discharge: 2022-08-04 | Disposition: A | Discharge status: Home / Self Care | Payer: Medicare Other | Attending: Ophthalmology | Admitting: Ophthalmology

## 2022-08-04 ENCOUNTER — Encounter: Payer: Self-pay | Admitting: Ophthalmology

## 2022-08-04 ENCOUNTER — Ambulatory Visit: Payer: Medicare Other | Admitting: Anesthesiology

## 2022-08-04 ENCOUNTER — Other Ambulatory Visit: Payer: Self-pay

## 2022-08-04 ENCOUNTER — Encounter
Admission: RE | Disposition: A | Discharge status: Home / Self Care | Payer: Self-pay | Source: Home / Self Care | Attending: Ophthalmology

## 2022-08-04 DIAGNOSIS — J45909 Unspecified asthma, uncomplicated: Secondary | ICD-10-CM | POA: Diagnosis not present

## 2022-08-04 DIAGNOSIS — E7800 Pure hypercholesterolemia, unspecified: Secondary | ICD-10-CM | POA: Diagnosis not present

## 2022-08-04 DIAGNOSIS — Z85528 Personal history of other malignant neoplasm of kidney: Secondary | ICD-10-CM | POA: Insufficient documentation

## 2022-08-04 DIAGNOSIS — I129 Hypertensive chronic kidney disease with stage 1 through stage 4 chronic kidney disease, or unspecified chronic kidney disease: Secondary | ICD-10-CM | POA: Diagnosis not present

## 2022-08-04 DIAGNOSIS — Z9221 Personal history of antineoplastic chemotherapy: Secondary | ICD-10-CM | POA: Diagnosis not present

## 2022-08-04 DIAGNOSIS — Z923 Personal history of irradiation: Secondary | ICD-10-CM | POA: Diagnosis not present

## 2022-08-04 DIAGNOSIS — I341 Nonrheumatic mitral (valve) prolapse: Secondary | ICD-10-CM | POA: Insufficient documentation

## 2022-08-04 DIAGNOSIS — N1832 Chronic kidney disease, stage 3b: Secondary | ICD-10-CM | POA: Insufficient documentation

## 2022-08-04 DIAGNOSIS — M810 Age-related osteoporosis without current pathological fracture: Secondary | ICD-10-CM | POA: Diagnosis not present

## 2022-08-04 DIAGNOSIS — E039 Hypothyroidism, unspecified: Secondary | ICD-10-CM | POA: Insufficient documentation

## 2022-08-04 DIAGNOSIS — Z79899 Other long term (current) drug therapy: Secondary | ICD-10-CM | POA: Insufficient documentation

## 2022-08-04 DIAGNOSIS — H2511 Age-related nuclear cataract, right eye: Secondary | ICD-10-CM | POA: Diagnosis present

## 2022-08-04 DIAGNOSIS — Z08 Encounter for follow-up examination after completed treatment for malignant neoplasm: Secondary | ICD-10-CM | POA: Diagnosis not present

## 2022-08-04 DIAGNOSIS — I4891 Unspecified atrial fibrillation: Secondary | ICD-10-CM | POA: Insufficient documentation

## 2022-08-04 DIAGNOSIS — Z905 Acquired absence of kidney: Secondary | ICD-10-CM | POA: Insufficient documentation

## 2022-08-04 HISTORY — DX: Nonrheumatic mitral (valve) insufficiency: I34.0

## 2022-08-04 HISTORY — PX: CATARACT EXTRACTION W/PHACO: SHX586

## 2022-08-04 HISTORY — DX: Personal history of transient ischemic attack (TIA), and cerebral infarction without residual deficits: Z86.73

## 2022-08-04 HISTORY — DX: Personal history of other diseases of the circulatory system: Z86.79

## 2022-08-04 HISTORY — DX: Chronic kidney disease, stage 3b: N18.32

## 2022-08-04 SURGERY — PHACOEMULSIFICATION, CATARACT, WITH IOL INSERTION
Anesthesia: Monitor Anesthesia Care | Site: Eye | Laterality: Right

## 2022-08-04 MED ORDER — TETRACAINE HCL 0.5 % OP SOLN
1.0000 [drp] | OPHTHALMIC | Status: DC | PRN
  Administered 2022-08-04 (×3): 1 [drp] via OPHTHALMIC

## 2022-08-04 MED ORDER — SODIUM CHLORIDE 0.9% FLUSH
INTRAVENOUS | Status: DC | PRN
  Administered 2022-08-04: 10 mL via INTRAVENOUS

## 2022-08-04 MED ORDER — SIGHTPATH DOSE#1 BSS IO SOLN
INTRAOCULAR | Status: DC | PRN
  Administered 2022-08-04: 2 mL

## 2022-08-04 MED ORDER — LACTATED RINGERS IV SOLN: INTRAVENOUS | Status: DC

## 2022-08-04 MED ORDER — BRIMONIDINE TARTRATE-TIMOLOL 0.2-0.5 % OP SOLN
OPHTHALMIC | Status: DC | PRN
  Administered 2022-08-04: 1 [drp] via OPHTHALMIC

## 2022-08-04 MED ORDER — MIDAZOLAM HCL 2 MG/2ML IJ SOLN
INTRAMUSCULAR | Status: DC | PRN
  Administered 2022-08-04: .5 mg via INTRAVENOUS

## 2022-08-04 MED ORDER — SIGHTPATH DOSE#1 NA HYALUR & NA CHOND-NA HYALUR IO KIT
PACK | INTRAOCULAR | Status: DC | PRN
  Administered 2022-08-04: 1 via OPHTHALMIC

## 2022-08-04 MED ORDER — SIGHTPATH DOSE#1 BSS IO SOLN
INTRAOCULAR | Status: DC | PRN
  Administered 2022-08-04: 1 mL via OPHTHALMIC

## 2022-08-04 MED ORDER — CEFUROXIME OPHTHALMIC INJECTION 1 MG/0.1 ML
INJECTION | OPHTHALMIC | Status: DC | PRN
  Administered 2022-08-04: .1 mL via INTRACAMERAL

## 2022-08-04 MED ORDER — SIGHTPATH DOSE#1 BSS IO SOLN
INTRAOCULAR | Status: DC | PRN
  Administered 2022-08-04: 57 mL via INTRAOCULAR

## 2022-08-04 MED ORDER — FENTANYL CITRATE (PF) 100 MCG/2ML IJ SOLN
INTRAMUSCULAR | Status: DC | PRN
  Administered 2022-08-04: 12.5 ug via INTRAVENOUS
  Administered 2022-08-04: 25 ug via INTRAVENOUS

## 2022-08-04 MED ORDER — ARMC OPHTHALMIC DILATING DROPS
1.0000 | OPHTHALMIC | Status: DC | PRN
  Administered 2022-08-04 (×3): 1 via OPHTHALMIC

## 2022-08-04 SURGICAL SUPPLY — 9 items
CATARACT SUITE SIGHTPATH (MISCELLANEOUS) ×1 IMPLANT
FEE CATARACT SUITE SIGHTPATH (MISCELLANEOUS) ×1 IMPLANT
GLOVE SRG 8 PF TXTR STRL LF DI (GLOVE) ×1 IMPLANT
GLOVE SURG ENC TEXT LTX SZ7.5 (GLOVE) ×1 IMPLANT
GLOVE SURG UNDER POLY LF SZ8 (GLOVE) ×1
LENS IOL TECNIS EYHANCE 18.5 (Intraocular Lens) IMPLANT
NDL FILTER BLUNT 18X1 1/2 (NEEDLE) ×1 IMPLANT
NEEDLE FILTER BLUNT 18X1 1/2 (NEEDLE) ×1 IMPLANT
SYR 3ML LL SCALE MARK (SYRINGE) ×1 IMPLANT

## 2022-08-04 NOTE — Op Note (Signed)
LOCATION:  [facility]   PREOPERATIVE DIAGNOSIS:    Nuclear sclerotic cataract right eye. H25.11   POSTOPERATIVE DIAGNOSIS:  Nuclear sclerotic cataract right eye.     PROCEDURE:  Phacoemusification with posterior chamber intraocular lens placement of the right eye   ULTRASOUND TIME: Procedure(s): CATARACT EXTRACTION PHACO AND INTRAOCULAR LENS PLACEMENT (IOC) RIGHT 4.74 00:32.9 (Right)  LENS:   Implant Name Type Inv. Item Serial No. Manufacturer Lot No. LRB No. Used Action  LENS IOL TECNIS EYHANCE 18.5 - [id number] Intraocular Lens LENS IOL TECNIS EYHANCE 18.5 [id number] [employer]  Right 1 Implanted         SURGEON:  Deirdre Evener, MD   ANESTHESIA:  Topical with tetracaine drops and 2% Xylocaine jelly, augmented with 1% preservative-free intracameral lidocaine.    COMPLICATIONS:  None.   DESCRIPTION OF PROCEDURE:  The patient was identified in the holding room and transported to the operating room and placed in the supine position under the operating microscope.  The right eye was identified as the operative eye and it was prepped and draped in the usual sterile ophthalmic fashion.   A 1 millimeter clear-corneal paracentesis was made at the 12:00 position.  0.5 ml of preservative-free 1% lidocaine was injected into the anterior chamber. The anterior chamber was filled with Viscoat viscoelastic.  A 2.4 millimeter keratome was used to make a near-clear corneal incision at the 9:00 position.  A curvilinear capsulorrhexis was made with a cystotome and capsulorrhexis forceps.  Balanced salt solution was used to hydrodissect and hydrodelineate the nucleus.   Phacoemulsification was then used in stop and chop fashion to remove the lens nucleus and epinucleus.  The remaining cortex was then removed using the irrigation and aspiration handpiece. Provisc was then placed into the capsular bag to distend it for lens placement.  A lens was then injected into the capsular bag.  The  remaining viscoelastic was aspirated.   Wounds were hydrated with balanced salt solution.  The anterior chamber was inflated to a physiologic pressure with balanced salt solution.  No wound leaks were noted. Cefuroxime 0.1 ml of a 10mg /ml solution was injected into the anterior chamber for a dose of 1 mg of intracameral antibiotic at the completion of the case.   Timolol and Brimonidine drops were applied to the eye.  The patient was taken to the recovery room in stable condition without complications of anesthesia or surgery.   Jocelyn Moss 08/04/2022, 11:21 AM

## 2022-08-04 NOTE — Anesthesia Postprocedure Evaluation (Signed)
Anesthesia Post Note  Patient: Jocelyn Moss  Procedure(s) Performed: CATARACT EXTRACTION PHACO AND INTRAOCULAR LENS PLACEMENT (IOC) RIGHT 4.74 00:32.9 (Right: Eye)  Patient location during evaluation: PACU Anesthesia Type: MAC Level of consciousness: awake and alert Pain management: pain level controlled Vital Signs Assessment: post-procedure vital signs reviewed and stable Respiratory status: spontaneous breathing, nonlabored ventilation, respiratory function stable and patient connected to nasal cannula oxygen Cardiovascular status: stable and blood pressure returned to baseline Postop Assessment: no apparent nausea or vomiting Anesthetic complications: no   No notable events documented.   Last Vitals:  Vitals:   08/04/22 1123 08/04/22 1126  BP: 128/80 131/80  Pulse: 61 60  Resp: 13 20  Temp:    SpO2: 100% 98%    Last Pain:  Vitals:   08/04/22 1126  TempSrc:   PainSc: 0-No pain                 Alisse Tuite C Darby Fleeman

## 2022-08-04 NOTE — Transfer of Care (Signed)
Immediate Anesthesia Transfer of Care Note  Patient: Jocelyn Moss  Procedure(s) Performed: CATARACT EXTRACTION PHACO AND INTRAOCULAR LENS PLACEMENT (IOC) RIGHT 4.74 00:32.9 (Right: Eye)  Patient Location: PACU  Anesthesia Type:MAC  Level of Consciousness: awake, alert , and oriented  Airway & Oxygen Therapy: Patient Spontanous Breathing  Post-op Assessment: Report given to RN  Post vital signs: Reviewed and stable  Last Vitals: See PACU flow sheet for temp.  VSS Vitals Value Taken Time  BP 128/80 08/04/22 1122  Temp    Pulse 61 08/04/22 1124  Resp 20 08/04/22 1124  SpO2 98 % 08/04/22 1124  Vitals shown include unvalidated device data.  Last Pain:  Vitals:   08/04/22 1008  TempSrc: Temporal  PainSc: 0-No pain         Complications: No notable events documented.

## 2022-08-04 NOTE — H&P (Signed)
[facility]   Primary Care Physician:  Donita Brooks, MD Ophthalmologist: Dr. Lockie Mola  Pre-Procedure History & Physical: HPI:  Jocelyn Moss is a 87 y.o. female here for ophthalmic surgery.   Past Medical History:  Diagnosis Date   Atrial fibrillation (HCC)    Breast cancer (HCC) 1990   Chest pain    Colon cancer (HCC)    ileostomy 2000   Dysrhythmia    History of atrial fibrillation    History of colon cancer    History of CVA (cerebrovascular accident)    Hypercholesterolemia    Hypothyroidism    Mild mitral regurgitation by prior echocardiogram    Multiple lacunar infarcts (HCC)    MVP (mitral valve prolapse)    Osteoporosis    osteopenia   Personal history of chemotherapy    6 months   Personal history of radiation therapy    6 months   Renal cell carcinoma (HCC)    stage 1 s/p resection Dr. Berneice Heinrich 2020   SOB (shortness of breath)    Stage 3b chronic kidney disease (CKD) (HCC)     Past Surgical History:  Procedure Laterality Date   BREAST BIOPSY     BREAST LUMPECTOMY Left    COLON SURGERY  2000   ileostomy   ILEOSTOMY     ROBOTIC ASSITED PARTIAL NEPHRECTOMY Right 02/01/2018   Procedure: XI ROBOTIC ASSISTED RETROPERITONEAL PARTIAL NEPHRECTOMY;  Surgeon: Sebastian Ache, MD;  Location: [facility];  Service: Urology;  Laterality: Right;    Prior to Admission medications   Medication Sig Start Date End Date Taking? Authorizing Provider  acetaminophen (TYLENOL) 500 MG tablet Take 500 mg by mouth every 6 (six) hours as needed (pain).   Yes [provider]  atorvastatin (LIPITOR) 20 MG tablet TAKE 1 TABLET BY MOUTH EVERY DAY 06/07/22  Yes Donita Brooks, MD  calcitRIOL (ROCALTROL) 0.25 MCG capsule Take 0.25 mcg by mouth daily. Take 1 by mouth every other day   Yes [provider]  calcium citrate (CALCITRATE - DOSED IN MG ELEMENTAL CALCIUM) 950 (200 Ca) MG tablet Take 200 mg of elemental calcium by mouth daily. OTC   Yes  [provider]  DULERA 100-5 MCG/ACT AERO USE 2 INHALATIONS ORALLY   TWICE DAILY 07/06/21  Yes Donita Brooks, MD  ferrous gluconate (FERGON) 324 MG tablet Take 324 mg by mouth 2 (two) times daily. 10/31/20  Yes [provider]  fluticasone (FLONASE) 50 MCG/ACT nasal spray Place into the nose. 07/14/20  Yes [provider]  levothyroxine (SYNTHROID) 100 MCG tablet TAKE 1 TABLET (100 MCG TOTAL) BY MOUTH EVERY OTHER DAY. 06/07/22  Yes Donita Brooks, MD  levothyroxine (SYNTHROID) 112 MCG tablet Take 1 tablet (112 mcg total) by mouth every other day. 06/22/22  Yes Donita Brooks, MD  metoprolol tartrate (LOPRESSOR) 25 MG tablet TAKE 1/2 TABLET (12.5 MG TOTAL) BY MOUTH 2 (TWO) TIMES DAILY AS NEEDED (PALPITATIONS). 12/28/21  Yes Donita Brooks, MD  Multiple Vitamins-Minerals (PRESERVISION AREDS 2) CAPS Take 1 capsule by mouth 2 (two) times daily.   Yes [provider]  pantoprazole (PROTONIX) 40 MG tablet TAKE 1 TABLET BY MOUTH EVERY DAY 05/06/22  Yes Donita Brooks, MD  Respiratory Therapy Supplies (FLUTTER) DEVI 1 Device by Does not apply route as directed. 06/06/19  Yes Mannam, Praveen, MD  Rivaroxaban (XARELTO) 15 MG TABS tablet TAKE 1 TABLET (15 MG TOTAL) BY MOUTH DAILY WITH SUPPER 06/28/22  Yes Lynnea Ferrier  T, MD  diltiazem (CARDIZEM) 30 MG tablet Take 1 tablet (30 mg total) by mouth 4 (four) times daily as needed (fast heart rate above 110). 12/04/21   Donita Brooks, MD  fluticasone (FLONASE) 50 MCG/ACT nasal spray Place 2 sprays into both nostrils daily. Patient not taking: Reported on 12/04/2021 12/26/20   Donita Brooks, MD  promethazine (PHENERGAN) 25 MG tablet Take 1 tablet (25 mg total) by mouth every 8 (eight) hours as needed for nausea or vomiting. Patient not taking: Reported on 08/04/2022 07/30/22   Donita Brooks, MD    Allergies as of 07/15/2022 - Review Complete 01/04/2022  Allergen Reaction Noted   Crestor [rosuvastatin calcium]  Other (See Comments) 06/15/2010   Zetia [ezetimibe] Other (See Comments) 06/15/2010    Family History  Problem Relation Age of Onset   Lung cancer Mother    Lung cancer Father    Lung cancer Sister    Breast cancer Sister 11    Social History   Socioeconomic History   Marital status: Married    Spouse name: Not on file   Number of children: Not on file   Years of education: Not on file   Highest education level: Not on file  Occupational History   Not on file  Tobacco Use   Smoking status: Never   Smokeless tobacco: Never  Vaping Use   Vaping Use: Never used  Substance and Sexual Activity   Alcohol use: No   Drug use: No   Sexual activity: Not on file  Other Topics Concern   Not on file  Social History Narrative   Not on file   Social Determinants of Health   Financial Resource Strain: Not on file  Food Insecurity: Not on file  Transportation Needs: Not on file  Physical Activity: Not on file  Stress: Not on file  Social Connections: Not on file  Intimate Partner Violence: Not on file    Review of Systems: See HPI, otherwise negative ROS  Physical Exam: Ht 5\' 6"  (1.676 m)   Wt 69.4 kg   BMI 24.69 kg/m  General:   Alert,  pleasant and cooperative in NAD Head:  Normocephalic and atraumatic. Lungs:  Clear to auscultation.    Heart:  Regular rate and rhythm.   Impression/Plan: Jocelyn Moss is here for ophthalmic surgery.  Risks, benefits, limitations, and alternatives regarding ophthalmic surgery have been reviewed with the patient.  Questions have been answered.  All parties agreeable.   Lockie Mola, MD  08/04/2022, 10:08 AM

## 2022-08-05 ENCOUNTER — Encounter: Payer: Self-pay | Admitting: Ophthalmology

## 2022-09-15 ENCOUNTER — Other Ambulatory Visit: Payer: Self-pay | Admitting: Family Medicine

## 2022-09-16 NOTE — Telephone Encounter (Signed)
Requested Prescriptions  Pending Prescriptions Disp Refills   pantoprazole (PROTONIX) 40 MG tablet [Pharmacy Med Name: PANTOPRAZOLE SOD DR 40 MG TAB] 90 tablet 1    Sig: TAKE 1 TABLET BY MOUTH EVERY DAY     Gastroenterology: Proton Pump Inhibitors Failed - 09/15/2022  7:38 AM      Failed - Valid encounter within last 12 months    Recent Outpatient Visits           1 year ago Hypothyroidism, unspecified type   [facility] Pickard, Priscille Heidelberg, MD   1 year ago Hypothyroidism, unspecified type   [facility] Donita Brooks, MD   1 year ago Persistent atrial fibrillation [facility])   [facility] Tanya Nones, Priscille Heidelberg, MD   1 year ago Persistent atrial fibrillation [facility])   [facility] Valentino Nose, NP   2 years ago Stage 3b chronic kidney disease (HCC)   [facility] Pickard, Priscille Heidelberg, MD               atorvastatin (LIPITOR) 20 MG tablet [Pharmacy Med Name: ATORVASTATIN 20 MG TABLET] 90 tablet 1    Sig: TAKE 1 TABLET BY MOUTH EVERY DAY     Cardiovascular:  Antilipid - Statins Failed - 09/15/2022  7:38 AM      Failed - Valid encounter within last 12 months    Recent Outpatient Visits           1 year ago Hypothyroidism, unspecified type   [facility] Pickard, Priscille Heidelberg, MD   1 year ago Hypothyroidism, unspecified type   [facility] Pickard, Priscille Heidelberg, MD   1 year ago Persistent atrial fibrillation [facility])   [facility] Pickard, Priscille Heidelberg, MD   1 year ago Persistent atrial fibrillation [facility])   [facility] Valentino Nose, NP   2 years ago Stage 3b chronic kidney disease (HCC)   [facility] Donita Brooks, MD              Failed - Lipid Panel in normal range within the last 12 months    Cholesterol  Date Value Ref Range Status  01/04/2022 135 <200 mg/dL Final   LDL Cholesterol (Calc)  Date Value Ref  Range Status  01/04/2022 47 mg/dL (calc) Final    Comment:    Reference range: <100 . Desirable range <100 mg/dL for primary prevention;   <70 mg/dL for patients with CHD or diabetic patients  with > or = 2 CHD risk factors. Marland Kitchen LDL-C is now calculated using the Martin-Hopkins  calculation, which is a validated novel method providing  better accuracy than the Friedewald equation in the  estimation of LDL-C.  Horald Pollen et al. Lenox Ahr. 8657;846(96): 2061-2068  (http://education.[employer].com/faq/FAQ164)    Direct LDL  Date Value Ref Range Status  05/25/2011 151.1 mg/dL Final    Comment:    Optimal:  <100 mg/dLNear or Above Optimal:  100-129 mg/dLBorderline High:  130-159 mg/dLHigh:  160-189 mg/dLVery High:  >190 mg/dL   HDL  Date Value Ref Range Status  01/04/2022 73 > OR = 50 mg/dL Final   Triglycerides  Date Value Ref Range Status  01/04/2022 72 <150 mg/dL Final         Passed - Patient is not pregnant

## 2022-10-19 ENCOUNTER — Other Ambulatory Visit: Payer: Self-pay | Admitting: Family Medicine

## 2022-10-19 DIAGNOSIS — Z8673 Personal history of transient ischemic attack (TIA), and cerebral infarction without residual deficits: Secondary | ICD-10-CM

## 2022-10-19 NOTE — Telephone Encounter (Signed)
Requested Prescriptions  Pending Prescriptions Disp Refills   pantoprazole (PROTONIX) 40 MG tablet [Pharmacy Med Name: PANTOPRAZOLE SOD DR 40 MG TAB] 90 tablet 1    Sig: TAKE 1 TABLET BY MOUTH EVERY DAY     Gastroenterology: Proton Pump Inhibitors Failed - 10/19/2022 10:56 AM      Failed - Valid encounter within last 12 months    Recent Outpatient Visits           1 year ago Hypothyroidism, unspecified type   [facility] Donita Brooks, MD   1 year ago Hypothyroidism, unspecified type   [facility] Donita Brooks, MD   1 year ago Persistent atrial fibrillation (HCC)   [facility] Donita Brooks, MD   2 years ago Persistent atrial fibrillation (HCC)   [facility] Valentino Nose, NP   2 years ago Stage 3b chronic kidney disease (HCC)   [facility] Pickard, Priscille Heidelberg, MD               XARELTO 15 MG TABS tablet [Pharmacy Med Name: XARELTO 15 MG TABLET] 90 tablet 0    Sig: TAKE 1 TABLET (15 MG TOTAL) BY MOUTH DAILY WITH SUPPER     Hematology: Anticoagulants - rivaroxaban Failed - 10/19/2022 10:56 AM      Failed - Cr in normal range and within 360 days    Creat  Date Value Ref Range Status  07/27/2022 2.17 (H) 0.60 - 0.95 mg/dL Final   Creatinine, Urine  Date Value Ref Range Status  01/04/2022 122 20 - 275 mg/dL Final         Failed - HCT in normal range and within 360 days    HCT  Date Value Ref Range Status  07/27/2022 31.2 (L) 35.0 - 45.0 % Final         Failed - HGB in normal range and within 360 days    Hemoglobin  Date Value Ref Range Status  07/27/2022 10.2 (L) 11.7 - 15.5 g/dL Final         Failed - Valid encounter within last 12 months    Recent Outpatient Visits           1 year ago Hypothyroidism, unspecified type   [facility] Donita Brooks, MD   1 year ago Hypothyroidism, unspecified type   [facility] Donita Brooks, MD   1 year ago Persistent atrial fibrillation (HCC)   [facility] Donita Brooks, MD   2 years ago Persistent atrial fibrillation [facility])   [facility] Valentino Nose, NP   2 years ago Stage 3b chronic kidney disease (HCC)   [facility] Pickard, Priscille Heidelberg, MD              Passed - ALT in normal range and within 360 days    ALT  Date Value Ref Range Status  01/04/2022 17 6 - 29 U/L Final         Passed - AST in normal range and within 360 days    AST  Date Value Ref Range Status  01/04/2022 24 10 - 35 U/L Final         Passed - PLT in normal range and within 360 days    Platelets  Date Value Ref Range Status  07/27/2022 156 140 - 400 Thousand/uL Final  Passed - eGFR is 15 or above and within 360 days    GFR, Est African American  Date Value Ref Range Status  07/14/2020 22 (L) > OR = 60 mL/min/1.4m2 Final   GFR, Est Non African American  Date Value Ref Range Status  07/14/2020 19 (L) > OR = 60 mL/min/1.52m2 Final   GFR, Estimated  Date Value Ref Range Status  09/08/2020 37 (L) >60 mL/min Final    Comment:    (NOTE) Calculated using the CKD-EPI Creatinine Equation (2021)    GFR  Date Value Ref Range Status  09/24/2019 27.63 (L) >60.00 mL/min Final   eGFR  Date Value Ref Range Status  01/04/2022 27 (L) > OR = 60 mL/min/1.29m2 Final         Passed - Patient is not pregnant

## 2022-10-19 NOTE — Telephone Encounter (Signed)
Patient's daughter called to follow up on refills requested for   pantoprazole (PROTONIX) 40 MG tablet [[id number]]   Rivaroxaban (XARELTO) 15 MG TABS tablet [[id number]] **Daughter wants to confirm patient will receive XARELTO and not the generic form. If not, requesting call to verify provider's approval. **Patient will be out of medication today**  Pharmacy confirmed as:  [id number] [place], [place] - [address] [place] [address] Phone: [phone]  Fax: [phone] DEA #: [id number]   Please advise at [phone]

## 2022-10-29 ENCOUNTER — Ambulatory Visit (INDEPENDENT_AMBULATORY_CARE_PROVIDER_SITE_OTHER): Payer: Medicare Other

## 2022-10-29 ENCOUNTER — Other Ambulatory Visit: Payer: Self-pay | Admitting: Family Medicine

## 2022-10-29 DIAGNOSIS — Z23 Encounter for immunization: Secondary | ICD-10-CM

## 2022-11-01 ENCOUNTER — Other Ambulatory Visit: Payer: Self-pay

## 2022-11-01 DIAGNOSIS — R0602 Shortness of breath: Secondary | ICD-10-CM

## 2022-11-01 DIAGNOSIS — J471 Bronchiectasis with (acute) exacerbation: Secondary | ICD-10-CM

## 2022-11-01 DIAGNOSIS — R053 Chronic cough: Secondary | ICD-10-CM

## 2022-11-01 MED ORDER — DULERA 100-5 MCG/ACT IN AERO
2.0000 | INHALATION_SPRAY | Freq: Two times a day (BID) | RESPIRATORY_TRACT | 2 refills | Status: AC
Start: 2022-11-01 — End: ?

## 2022-12-12 ENCOUNTER — Other Ambulatory Visit: Payer: Self-pay | Admitting: Family Medicine

## 2022-12-12 DIAGNOSIS — E038 Other specified hypothyroidism: Secondary | ICD-10-CM

## 2022-12-13 ENCOUNTER — Other Ambulatory Visit: Payer: Self-pay | Admitting: Family Medicine

## 2022-12-13 DIAGNOSIS — E038 Other specified hypothyroidism: Secondary | ICD-10-CM

## 2022-12-13 NOTE — Telephone Encounter (Signed)
Requested Prescriptions  Pending Prescriptions Disp Refills   levothyroxine (SYNTHROID) 112 MCG tablet [Pharmacy Med Name: LEVOTHYROXINE 112 MCG TABLET] 45 tablet 2    Sig: TAKE 1 TABLET (112 MCG TOTAL) BY MOUTH EVERY OTHER DAY.     Endocrinology:  Hypothyroid Agents Failed - 12/12/2022  8:43 AM      Failed - Valid encounter within last 12 months    Recent Outpatient Visits           1 year ago Hypothyroidism, unspecified type   [facility] Pickard, Priscille Heidelberg, MD   1 year ago Hypothyroidism, unspecified type   [facility] Donita Brooks, MD   2 years ago Persistent atrial fibrillation [facility])   [facility] Donita Brooks, MD   2 years ago Persistent atrial fibrillation [facility])   [facility] Valentino Nose, NP   2 years ago Stage 3b chronic kidney disease (HCC)   [facility] Pickard, Priscille Heidelberg, MD              Passed - TSH in normal range and within 360 days    TSH  Date Value Ref Range Status  07/27/2022 2.50 0.40 - 4.50 mIU/L Final

## 2022-12-14 ENCOUNTER — Other Ambulatory Visit: Payer: Self-pay

## 2022-12-14 ENCOUNTER — Telehealth: Payer: Self-pay

## 2022-12-14 DIAGNOSIS — I4891 Unspecified atrial fibrillation: Secondary | ICD-10-CM

## 2022-12-14 DIAGNOSIS — R009 Unspecified abnormalities of heart beat: Secondary | ICD-10-CM

## 2022-12-14 MED ORDER — METOPROLOL TARTRATE 25 MG PO TABS
ORAL_TABLET | ORAL | 1 refills | Status: DC
Start: 2022-12-14 — End: 2023-12-01

## 2022-12-14 NOTE — Telephone Encounter (Signed)
Prescription Request  12/14/2022  LOV: 07/30/22  What is the name of the medication or equipment? XARELTO 15 MG TABS tablet [[id number]]   Have you contacted your pharmacy to request a refill? Yes   Which pharmacy would you like this sent to? [id number] [place], [place] - [address] [place] [address] Phone: [phone]  Fax: [phone]     Patient notified that their request is being sent to the clinical staff for review and that they should receive a response within 2 business days.   Please advise at [facility] [phone]

## 2022-12-14 NOTE — Telephone Encounter (Signed)
Prescription Request  12/14/2022  LOV: 08/04/22  What is the name of the medication or equipment? metoprolol tartrate (LOPRESSOR) 25 MG tablet [[id number]]  Have you contacted your pharmacy to request a refill? Yes   Which pharmacy would you like this sent to? [id number] [place], [place] - [address] [place] [address] Phone: [phone]  Fax: [phone]     Patient notified that their request is being sent to the clinical staff for review and that they should receive a response within 2 business days.   Please advise at [facility] [phone]

## 2022-12-14 NOTE — Telephone Encounter (Signed)
Requested Prescriptions  Pending Prescriptions Disp Refills   atorvastatin (LIPITOR) 20 MG tablet [Pharmacy Med Name: ATORVASTATIN 20 MG TABLET] 90 tablet 0    Sig: TAKE 1 TABLET BY MOUTH EVERY DAY     Cardiovascular:  Antilipid - Statins Failed - 12/13/2022  4:27 PM      Failed - Valid encounter within last 12 months    Recent Outpatient Visits           1 year ago Hypothyroidism, unspecified type   [facility] Pickard, Priscille Heidelberg, MD   1 year ago Hypothyroidism, unspecified type   [facility] Donita Brooks, MD   2 years ago Persistent atrial fibrillation [facility])   [facility] Donita Brooks, MD   2 years ago Persistent atrial fibrillation [facility])   [facility] Valentino Nose, NP   2 years ago Stage 3b chronic kidney disease (HCC)   [facility] Donita Brooks, MD              Failed - Lipid Panel in normal range within the last 12 months    Cholesterol  Date Value Ref Range Status  01/04/2022 135 <200 mg/dL Final   LDL Cholesterol (Calc)  Date Value Ref Range Status  01/04/2022 47 mg/dL (calc) Final    Comment:    Reference range: <100 . Desirable range <100 mg/dL for primary prevention;   <70 mg/dL for patients with CHD or diabetic patients  with > or = 2 CHD risk factors. Marland Kitchen LDL-C is now calculated using the Martin-Hopkins  calculation, which is a validated novel method providing  better accuracy than the Friedewald equation in the  estimation of LDL-C.  Horald Pollen et al. Lenox Ahr. 1610;960(45): 2061-2068  (http://education.[facility].com/faq/FAQ164)    Direct LDL  Date Value Ref Range Status  05/25/2011 151.1 mg/dL Final    Comment:    Optimal:  <100 mg/dLNear or Above Optimal:  100-129 mg/dLBorderline High:  130-159 mg/dLHigh:  160-189 mg/dLVery High:  >190 mg/dL   HDL  Date Value Ref Range Status  01/04/2022 73 > OR = 50 mg/dL Final   Triglycerides  Date  Value Ref Range Status  01/04/2022 72 <150 mg/dL Final         Passed - Patient is not pregnant

## 2023-01-17 ENCOUNTER — Other Ambulatory Visit: Payer: Self-pay

## 2023-01-17 ENCOUNTER — Telehealth: Payer: Self-pay

## 2023-01-17 DIAGNOSIS — Z8673 Personal history of transient ischemic attack (TIA), and cerebral infarction without residual deficits: Secondary | ICD-10-CM

## 2023-01-17 MED ORDER — RIVAROXABAN 15 MG PO TABS
15.0000 mg | ORAL_TABLET | Freq: Every day | ORAL | 0 refills | Status: DC
Start: 2023-01-17 — End: 2023-03-16

## 2023-01-17 NOTE — Telephone Encounter (Signed)
Prescription Request  01/17/2023  LOV: 07/30/22  What is the name of the medication or equipment? XARELTO 15 MG TABS tablet [[id number]]   Have you contacted your pharmacy to request a refill? Yes   Which pharmacy would you like this sent to?  [id number] [place], [place] - [address] [place] [address] Phone: [phone] Fax: [phone]    Patient notified that their request is being sent to the clinical staff for review and that they should receive a response within 2 business days.   Please advise at [facility] [phone]

## 2023-01-17 NOTE — Telephone Encounter (Signed)
Prescription Request  01/17/2023  LOV:07/30/22  What is the name of the medication or equipment? atorvastatin (LIPITOR) 20 MG tablet [[id number]]   Have you contacted your pharmacy to request a refill? Yes   Which pharmacy would you like this sent to?  [id number] [place], [place] - [address] AT [address] [place] [address] Phone: [phone] Fax: [phone]    Patient notified that their request is being sent to the clinical staff for review and that they should receive a response within 2 business days.   Please advise at [facility] [phone]

## 2023-01-20 ENCOUNTER — Other Ambulatory Visit: Payer: Self-pay | Admitting: Family Medicine

## 2023-01-20 NOTE — Telephone Encounter (Signed)
Prescription Request  01/20/2023  LOV: 07/30/2022  What is the name of the medication or equipment? atorvastatin (LIPITOR) 20 MG tablet   Have you contacted your pharmacy to request a refill? Yes   Which pharmacy would you like this sent to?  [id number] [place], [place] - [address] [place] [address] Phone: [phone] Fax: [phone]    Patient notified that their request is being sent to the clinical staff for review and that they should receive a response within 2 business days.   Please advise at [facility] [phone]

## 2023-03-11 ENCOUNTER — Telehealth: Payer: Self-pay

## 2023-03-11 NOTE — Telephone Encounter (Signed)
Spoke to Antwanette and when she went to transfer the call, Gunnar Fusi had already hung up. Mjp,lpn  Copied from CRM [id number]. Topic: General - Other >> Mar 11, 2023 12:17 PM Antwanette L wrote: Reason for CRM: Gunnar Fusi from [facility] is calling to speak with Dr.Pickard nurse to ask some clinical questions. The questions are regarding dulera(inhaler).

## 2023-03-15 ENCOUNTER — Other Ambulatory Visit: Payer: Self-pay | Admitting: Family Medicine

## 2023-03-15 DIAGNOSIS — Z8673 Personal history of transient ischemic attack (TIA), and cerebral infarction without residual deficits: Secondary | ICD-10-CM

## 2023-03-28 ENCOUNTER — Other Ambulatory Visit: Payer: Self-pay

## 2023-03-28 ENCOUNTER — Telehealth: Payer: Self-pay | Admitting: Family Medicine

## 2023-03-28 DIAGNOSIS — E782 Mixed hyperlipidemia: Secondary | ICD-10-CM

## 2023-03-28 MED ORDER — ATORVASTATIN CALCIUM 20 MG PO TABS: ORAL_TABLET | ORAL | 0 refills | Status: DC

## 2023-03-28 MED ORDER — ATORVASTATIN CALCIUM 20 MG PO TABS
ORAL_TABLET | ORAL | 0 refills | Status: DC
Start: 2023-03-28 — End: 2023-05-23

## 2023-03-28 NOTE — Addendum Note (Signed)
 Addended by: Arta Silence on: 03/28/2023 12:36 PM   Modules accepted: Orders

## 2023-03-28 NOTE — Telephone Encounter (Signed)
 Prescription Request  03/28/2023  LOV: 07/30/2022  What is the name of the medication or equipment?   atorvastatin (LIPITOR) 20 MG tablet   Have you contacted your pharmacy to request a refill? Yes   Which pharmacy would you like this sent to?  [id number] [place], [place] - [address] [place] [address] Phone: [phone] Fax: [phone]    Patient notified that their request is being sent to the clinical staff for review and that they should receive a response within 2 business days.   Please advise pharmacist.

## 2023-04-13 ENCOUNTER — Other Ambulatory Visit: Payer: Self-pay

## 2023-04-13 ENCOUNTER — Telehealth: Payer: Self-pay | Admitting: Family Medicine

## 2023-04-13 DIAGNOSIS — R079 Chest pain, unspecified: Secondary | ICD-10-CM

## 2023-04-13 MED ORDER — PANTOPRAZOLE SODIUM 40 MG PO TBEC
40.0000 mg | DELAYED_RELEASE_TABLET | Freq: Every day | ORAL | 0 refills | Status: DC
Start: 2023-04-13 — End: 2023-05-16

## 2023-04-13 NOTE — Telephone Encounter (Signed)
 Prescription Request  04/13/2023  LOV: 07/30/2022  What is the name of the medication or equipment?   pantoprazole (PROTONIX) 40 MG tablet   Have you contacted your pharmacy to request a refill? Yes   Which pharmacy would you like this sent to?  [id number] [place], [place] - [address] [place] [address] Phone: [phone] Fax: [phone]    Patient notified that their request is being sent to the clinical staff for review and that they should receive a response within 2 business days.   Please advise pharmacist

## 2023-05-05 ENCOUNTER — Ambulatory Visit: Admitting: Family Medicine

## 2023-05-05 ENCOUNTER — Encounter: Payer: Self-pay | Admitting: Family Medicine

## 2023-05-05 VITALS — BP 112/68 | HR 61 | Temp 97.9°F | Ht 65.98 in | Wt 146.8 lb

## 2023-05-05 DIAGNOSIS — I4891 Unspecified atrial fibrillation: Secondary | ICD-10-CM

## 2023-05-05 DIAGNOSIS — E039 Hypothyroidism, unspecified: Secondary | ICD-10-CM | POA: Diagnosis not present

## 2023-05-05 DIAGNOSIS — Z8673 Personal history of transient ischemic attack (TIA), and cerebral infarction without residual deficits: Secondary | ICD-10-CM

## 2023-05-05 MED ORDER — PROMETHAZINE HCL 25 MG PO TABS: 25.0000 mg | ORAL_TABLET | Freq: Three times a day (TID) | ORAL | 0 refills | Status: DC | PRN

## 2023-05-05 MED ORDER — DILTIAZEM HCL 30 MG PO TABS: 30.0000 mg | ORAL_TABLET | Freq: Four times a day (QID) | ORAL | 0 refills | Status: DC | PRN

## 2023-05-05 NOTE — Progress Notes (Signed)
 Subjective:    Patient ID: Jocelyn Moss, female    DOB: January 10, 1936, 88 y.o.   MRN: [id number]  HPI Patient has stage IIIb chronic kidney disease, atrial fibrillation, and hypothyroidism.  She is here today for follow-up.  She is currently on atorvastatin due to history of lacunar infarcts and microvascular ischemic changes in the brain seen on an MRI in 2017.  Therefore she is taking meds for secondary prevention of strokes.  At the time, she was experiencing some mild memory loss.  This has been stable.  She does complain of muscle weakness in her legs.  She states that when she stands at the sink to wash dishes or when she walks less than half a mile, her leg starts become extremely tired and she has to stop and rest.  She attributed this to old age.  Her neurologist recommended she discontinue atorvastatin.  She denies any chest pain shortness of breath or dyspnea on exertion. Past Medical History:  Diagnosis Date   Atrial fibrillation (HCC)    Breast cancer (HCC) 1990   Chest pain    Colon cancer (HCC)    ileostomy 2000   Dysrhythmia    History of atrial fibrillation    History of colon cancer    History of CVA (cerebrovascular accident)    Hypercholesterolemia    Hypothyroidism    Mild mitral regurgitation by prior echocardiogram    Multiple lacunar infarcts (HCC)    MVP (mitral valve prolapse)    Osteoporosis    osteopenia   Personal history of chemotherapy    6 months   Personal history of radiation therapy    6 months   Renal cell carcinoma (HCC)    stage 1 s/p resection Dr. Berneice Heinrich 2020   SOB (shortness of breath)    Stage 3b chronic kidney disease (CKD) (HCC)    Past Surgical History:  Procedure Laterality Date   BREAST BIOPSY     BREAST LUMPECTOMY Left    CATARACT EXTRACTION W/PHACO Right 08/04/2022   Procedure: CATARACT EXTRACTION PHACO AND INTRAOCULAR LENS PLACEMENT (IOC) RIGHT 4.74 00:32.9;  Surgeon: Lockie Mola, MD;  Location: [facility];   Service: Ophthalmology;  Laterality: Right;   COLON SURGERY  2000   ileostomy   ILEOSTOMY     ROBOTIC ASSITED PARTIAL NEPHRECTOMY Right 02/01/2018   Procedure: XI ROBOTIC ASSISTED RETROPERITONEAL PARTIAL NEPHRECTOMY;  Surgeon: Sebastian Ache, MD;  Location: [facility];  Service: Urology;  Laterality: Right;   Current Outpatient Medications on File Prior to Visit  Medication Sig Dispense Refill   acetaminophen (TYLENOL) 500 MG tablet Take 500 mg by mouth every 6 (six) hours as needed (pain).     atorvastatin (LIPITOR) 20 MG tablet TAKE 1 TABLET BY MOUTH EVERY DAY 30 tablet 0   calcitRIOL (ROCALTROL) 0.25 MCG capsule Take 0.25 mcg by mouth daily. Take 1 by mouth every other day     calcium citrate (CALCITRATE - DOSED IN MG ELEMENTAL CALCIUM) 950 (200 Ca) MG tablet Take 200 mg of elemental calcium by mouth daily. OTC     ferrous gluconate (FERGON) 324 MG tablet Take 324 mg by mouth 2 (two) times daily.     fluticasone (FLONASE) 50 MCG/ACT nasal spray Place into the nose.     fluticasone (FLONASE) 50 MCG/ACT nasal spray Place 2 sprays into both nostrils daily. (Patient not taking: Reported on 12/04/2021) 16 g 6   levothyroxine (SYNTHROID) 100 MCG tablet TAKE 1 TABLET (100 MCG TOTAL) BY MOUTH EVERY  OTHER DAY. 45 tablet 3   levothyroxine (SYNTHROID) 112 MCG tablet TAKE 1 TABLET (112 MCG TOTAL) BY MOUTH EVERY OTHER DAY. 45 tablet 1   metoprolol tartrate (LOPRESSOR) 25 MG tablet TAKE 1/2 TABLET (12.5 MG TOTAL) BY MOUTH 2 (TWO) TIMES DAILY AS NEEDED (PALPITATIONS). 180 tablet 1   mometasone-formoterol (DULERA) 100-5 MCG/ACT AERO Inhale 2 puffs into the lungs in the morning and at bedtime. 39 g 2   Multiple Vitamins-Minerals (PRESERVISION AREDS 2) CAPS Take 1 capsule by mouth 2 (two) times daily.     pantoprazole (PROTONIX) 40 MG tablet Take 1 tablet (40 mg total) by mouth daily. 90 tablet 0   Respiratory Therapy Supplies (FLUTTER) DEVI 1 Device by Does not apply route as directed. 1 each 0   XARELTO 15 MG  TABS tablet TAKE 1 TABLET (15 MG TOTAL) BY MOUTH DAILY WITH SUPPER 90 tablet 0   No current facility-administered medications on file prior to visit.   Allergies  Allergen Reactions   Crestor [Rosuvastatin Calcium] Other (See Comments)    Joint pain, effected liver function    Zetia [Ezetimibe] Other (See Comments)    Joint pain, effected liver function    Social History   Socioeconomic History   Marital status: Married    Spouse name: Not on file   Number of children: Not on file   Years of education: Not on file   Highest education level: Not on file  Occupational History   Not on file  Tobacco Use   Smoking status: Never   Smokeless tobacco: Never  Vaping Use   Vaping status: Never Used  Substance and Sexual Activity   Alcohol use: No   Drug use: No   Sexual activity: Not on file  Other Topics Concern   Not on file  Social History Narrative   Not on file   Social Drivers of Health   Financial Resource Strain: Not on file  Food Insecurity: Not on file  Transportation Needs: Not on file  Physical Activity: Not on file  Stress: Not on file  Social Connections: Not on file  Intimate Partner Violence: Not on file       Review of Systems  All other systems reviewed and are negative.      Objective:   Physical Exam Vitals reviewed.  Constitutional:      General: She is not in acute distress.    Appearance: Normal appearance. She is normal weight. She is not ill-appearing or toxic-appearing.  HENT:     Mouth/Throat:     Mouth: Mucous membranes are moist.  Eyes:     Conjunctiva/sclera: Conjunctivae normal.     Pupils: Pupils are equal, round, and reactive to light.  Cardiovascular:     Rate and Rhythm: Normal rate. Rhythm irregular.     Pulses: Normal pulses.     Heart sounds: Normal heart sounds. No murmur heard.    No friction rub. No gallop.  Pulmonary:     Effort: Pulmonary effort is normal. No respiratory distress.     Breath sounds: Normal  breath sounds. No stridor.  Abdominal:     General: Abdomen is flat. Bowel sounds are normal. There is no distension.     Tenderness: There is no abdominal tenderness. There is no guarding.  Musculoskeletal:     Right lower leg: No edema.     Left lower leg: No edema.  Skin:    Findings: No rash.  Neurological:     General: No focal  deficit present.     Mental Status: She is alert. Mental status is at baseline.     Cranial Nerves: No cranial nerve deficit.     Sensory: No sensory deficit.     Motor: No weakness.     Coordination: Coordination normal.     Gait: Gait normal.        Assessment & Plan:  Atrial fibrillation, unspecified type (HCC) - Plan: CBC with Differential/Platelet, Lipid panel, COMPLETE METABOLIC PANEL WITHOUT GFR, TSH  History of CVA (cerebrovascular accident) - Plan: CBC with Differential/Platelet, Lipid panel, COMPLETE METABOLIC PANEL WITHOUT GFR, TSH  Hypothyroidism, unspecified type - Plan: CBC with Differential/Platelet, Lipid panel, COMPLETE METABOLIC PANEL WITHOUT GFR, TSH Blood pressure today is outstanding.  We discussed risk versus benefits of discontinuing atorvastatin.  Patient has history of previous CVA and lacunar infarcts seen on MRI.  She is taking atorvastatin to prevent further microvascular damage.  However she may getting muscle weakness secondary to the atorvastatin.  Therefore I recommended she stop the medication for 3 to 4 weeks.  If she does not see any significant change or benefit, I recommended that she resume the medication.  However if the muscle weakness in her legs improved dramatically, we may discontinue the atorvastatin.  Meanwhile check CBC CMP lipid panel and TSH.

## 2023-05-06 LAB — CBC WITH DIFFERENTIAL/PLATELET
Absolute Lymphocytes: 728 {cells}/uL — ABNORMAL LOW (ref 850–3900)
Absolute Monocytes: 499 {cells}/uL (ref 200–950)
Basophils Absolute: 21 {cells}/uL (ref 0–200)
Basophils Relative: 0.4 %
Eosinophils Absolute: 172 {cells}/uL (ref 15–500)
Eosinophils Relative: 3.3 %
HCT: 35.4 % (ref 35.0–45.0)
Hemoglobin: 11.3 g/dL — ABNORMAL LOW (ref 11.7–15.5)
MCH: 30.1 pg (ref 27.0–33.0)
MCHC: 31.9 g/dL — ABNORMAL LOW (ref 32.0–36.0)
MCV: 94.4 fL (ref 80.0–100.0)
MPV: 10.4 fL (ref 7.5–12.5)
Monocytes Relative: 9.6 %
Neutro Abs: 3780 {cells}/uL (ref 1500–7800)
Neutrophils Relative %: 72.7 %
Platelets: 169 10*3/uL (ref 140–400)
RBC: 3.75 10*6/uL — ABNORMAL LOW (ref 3.80–5.10)
RDW: 12.4 % (ref 11.0–15.0)
Total Lymphocyte: 14 %
WBC: 5.2 10*3/uL (ref 3.8–10.8)

## 2023-05-06 LAB — COMPLETE METABOLIC PANEL WITHOUT GFR
AG Ratio: 1.3 (calc) (ref 1.0–2.5)
ALT: 15 U/L (ref 6–29)
AST: 22 U/L (ref 10–35)
Albumin: 4.2 g/dL (ref 3.6–5.1)
Alkaline phosphatase (APISO): 124 U/L (ref 37–153)
BUN/Creatinine Ratio: 16 (calc) (ref 6–22)
BUN: 34 mg/dL — ABNORMAL HIGH (ref 7–25)
CO2: 24 mmol/L (ref 20–32)
Calcium: 9.3 mg/dL (ref 8.6–10.4)
Chloride: 103 mmol/L (ref 98–110)
Creat: 2.15 mg/dL — ABNORMAL HIGH (ref 0.60–0.95)
Globulin: 3.2 g/dL (ref 1.9–3.7)
Glucose, Bld: 113 mg/dL — ABNORMAL HIGH (ref 65–99)
Potassium: 4.3 mmol/L (ref 3.5–5.3)
Sodium: 137 mmol/L (ref 135–146)
Total Bilirubin: 0.7 mg/dL (ref 0.2–1.2)
Total Protein: 7.4 g/dL (ref 6.1–8.1)

## 2023-05-06 LAB — LIPID PANEL
Cholesterol: 139 mg/dL (ref <200)
HDL: 68 mg/dL (ref >=50)
LDL Cholesterol (Calc): 56 mg/dL
Non-HDL Cholesterol (Calc): 71 mg/dL (ref <130)
Total CHOL/HDL Ratio: 2 (calc) (ref <5.0)
Triglycerides: 74 mg/dL (ref <150)

## 2023-05-06 LAB — TSH: TSH: 2.22 m[IU]/L (ref 0.40–4.50)

## 2023-05-10 ENCOUNTER — Ambulatory Visit: Admitting: Family Medicine

## 2023-05-10 ENCOUNTER — Encounter: Payer: Self-pay | Admitting: Family Medicine

## 2023-05-10 VITALS — BP 112/68 | Ht 69.6 in | Wt 146.0 lb

## 2023-05-10 DIAGNOSIS — Z0000 Encounter for general adult medical examination without abnormal findings: Secondary | ICD-10-CM | POA: Diagnosis not present

## 2023-05-10 NOTE — Patient Instructions (Signed)

## 2023-05-10 NOTE — Progress Notes (Signed)
 Subjective:   Jocelyn Moss is a 88 y.o. female who presents for Medicare Annual (Subsequent) preventive examination.  Visit Complete: Virtual I connected with  Brion Aliment on 05/10/23 by a audio enabled telemedicine application and verified that I am speaking with the correct person using two identifiers.  Patient Location: Home  Provider Location: Home Office  I discussed the limitations of evaluation and management by telemedicine. The patient expressed understanding and agreed to proceed.  Vital Signs: Because this visit was a virtual/telehealth visit, some criteria may be missing or patient reported. Any vitals not documented were not able to be obtained and vitals that have been documented are patient reported.  Patient Medicare AWV questionnaire was completed by the patient on 05/10/2023; I have confirmed that all information answered by patient is correct and no changes since this date.  Cardiac Risk Factors include: advanced age (>52men, >69 women);hypertension;obesity (BMI >30kg/m2)     Objective:    Today's Vitals   05/10/23 0815  BP: 112/68  Weight: 146 lb (66.2 kg)  Height: 5' 9.6" (1.768 m)   Body mass index is 21.19 kg/m.     05/10/2023    8:44 AM 08/04/2022    9:48 AM 09/06/2020    4:00 PM 09/05/2020   10:33 PM 02/01/2018    6:44 PM 01/27/2018    9:03 AM 02/06/2016   11:00 PM  Advanced Directives  Does Patient Have a Medical Advance Directive? -- Yes Yes Yes Yes Yes Yes  Type of Advance Directive   Healthcare Power of [place];Living will Healthcare Power of [place];Living will Healthcare Power of [employer] of [place];Living will Healthcare Power of [place];Living will  Does patient want to make changes to medical advance directive?  No - Guardian declined No - Patient declined No - Patient declined No - Patient declined No - Patient declined No - Patient declined  Copy of Healthcare Power of Attorney in Chart?   No - copy requested No -  copy requested No - copy requested No - copy requested No - copy requested    Current Medications (verified) Outpatient Encounter Medications as of 05/10/2023  Medication Sig   acetaminophen (TYLENOL) 500 MG tablet Take 500 mg by mouth every 6 (six) hours as needed (pain).   atorvastatin (LIPITOR) 20 MG tablet TAKE 1 TABLET BY MOUTH EVERY DAY   calcitRIOL (ROCALTROL) 0.25 MCG capsule Take 0.25 mcg by mouth daily. Take 1 by mouth every other day   calcium citrate (CALCITRATE - DOSED IN MG ELEMENTAL CALCIUM) 950 (200 Ca) MG tablet Take 200 mg of elemental calcium by mouth daily. OTC   diltiazem (CARDIZEM) 30 MG tablet Take 1 tablet (30 mg total) by mouth 4 (four) times daily as needed (fast heart rate above 110).   ferrous gluconate (FERGON) 324 MG tablet Take 324 mg by mouth 2 (two) times daily.   fluticasone (FLONASE) 50 MCG/ACT nasal spray Place into the nose.   fluticasone (FLONASE) 50 MCG/ACT nasal spray Place 2 sprays into both nostrils daily. (Patient not taking: Reported on 12/04/2021)   levothyroxine (SYNTHROID) 100 MCG tablet TAKE 1 TABLET (100 MCG TOTAL) BY MOUTH EVERY OTHER DAY.   levothyroxine (SYNTHROID) 112 MCG tablet TAKE 1 TABLET (112 MCG TOTAL) BY MOUTH EVERY OTHER DAY.   metoprolol tartrate (LOPRESSOR) 25 MG tablet TAKE 1/2 TABLET (12.5 MG TOTAL) BY MOUTH 2 (TWO) TIMES DAILY AS NEEDED (PALPITATIONS).   mometasone-formoterol (DULERA) 100-5 MCG/ACT AERO Inhale 2 puffs into the lungs in the  morning and at bedtime.   Multiple Vitamins-Minerals (PRESERVISION AREDS 2) CAPS Take 1 capsule by mouth 2 (two) times daily.   pantoprazole (PROTONIX) 40 MG tablet Take 1 tablet (40 mg total) by mouth daily.   promethazine (PHENERGAN) 25 MG tablet Take 1 tablet (25 mg total) by mouth every 8 (eight) hours as needed for nausea or vomiting.   Respiratory Therapy Supplies (FLUTTER) DEVI 1 Device by Does not apply route as directed.   XARELTO 15 MG TABS tablet TAKE 1 TABLET (15 MG TOTAL) BY MOUTH  DAILY WITH SUPPER   No facility-administered encounter medications on file as of 05/10/2023.    Allergies (verified) Crestor [rosuvastatin calcium] and Zetia [ezetimibe]   History: Past Medical History:  Diagnosis Date   Atrial fibrillation (HCC)    Breast cancer (HCC) 1990   Chest pain    Colon cancer (HCC)    ileostomy 2000   Dysrhythmia    History of atrial fibrillation    History of colon cancer    History of CVA (cerebrovascular accident)    Hypercholesterolemia    Hypothyroidism    Mild mitral regurgitation by prior echocardiogram    Multiple lacunar infarcts (HCC)    MVP (mitral valve prolapse)    Osteoporosis    osteopenia   Personal history of chemotherapy    6 months   Personal history of radiation therapy    6 months   Renal cell carcinoma (HCC)    stage 1 s/p resection Dr. Secundino Dach 2020   SOB (shortness of breath)    Stage 3b chronic kidney disease (CKD) (HCC)    Past Surgical History:  Procedure Laterality Date   BREAST BIOPSY     BREAST LUMPECTOMY Left    CATARACT EXTRACTION W/PHACO Right 08/04/2022   Procedure: CATARACT EXTRACTION PHACO AND INTRAOCULAR LENS PLACEMENT (IOC) RIGHT 4.74 00:32.9;  Surgeon: Annell Kidney, MD;  Location: [facility];  Service: Ophthalmology;  Laterality: Right;   COLON SURGERY  2000   ileostomy   ILEOSTOMY     ROBOTIC ASSITED PARTIAL NEPHRECTOMY Right 02/01/2018   Procedure: XI ROBOTIC ASSISTED RETROPERITONEAL PARTIAL NEPHRECTOMY;  Surgeon: Osborn Blaze, MD;  Location: [facility];  Service: Urology;  Laterality: Right;   Family History  Problem Relation Age of Onset   Lung cancer Mother    Lung cancer Father    Lung cancer Sister    Breast cancer Sister 80   Social History   Socioeconomic History   Marital status: Married    Spouse name: Not on file   Number of children: Not on file   Years of education: Not on file   Highest education level: Not on file  Occupational History   Not on file  Tobacco Use    Smoking status: Never   Smokeless tobacco: Never  Vaping Use   Vaping status: Never Used  Substance and Sexual Activity   Alcohol use: No   Drug use: No   Sexual activity: Not on file  Other Topics Concern   Not on file  Social History Narrative   Not on file   Social Drivers of Health   Financial Resource Strain: Patient Declined (05/10/2023)   Overall Financial Resource Strain (CARDIA)    Difficulty of Paying Living Expenses: Patient declined  Food Insecurity: No Food Insecurity (05/10/2023)   Hunger Vital Sign    Worried About Running Out of Food in the Last Year: Never true    Ran Out of Food in the Last Year: Never true  Transportation Needs: No Transportation Needs (05/10/2023)   PRAPARE - Administrator, Civil Service (Medical): No    Lack of Transportation (Non-Medical): No  Physical Activity: Patient Declined (05/10/2023)   Exercise Vital Sign    Days of Exercise per Week: Patient declined    Minutes of Exercise per Session: Patient declined  Stress: Patient Declined (05/10/2023)   [employer] of Occupational Health - Occupational Stress Questionnaire    Feeling of Stress : Patient declined  Social Connections: Patient Declined (05/10/2023)   Social Connection and Isolation Panel [NHANES]    Frequency of Communication with Friends and Family: Patient declined    Frequency of Social Gatherings with Friends and Family: Patient declined    Attends Religious Services: Patient declined    Database administrator or Organizations: Patient declined    Attends Engineer, structural: Patient declined    Marital Status: Patient declined    Tobacco Counseling Counseling given: Not Answered   Clinical Intake:  Pre-visit preparation completed: Yes  Pain : No/denies pain     Nutritional Risks: None Diabetes: No  How often do you need to have someone help you when you read instructions, pamphlets, or other written materials from your doctor or  pharmacy?: 1 - Never What is the last grade level you completed in school?: 12th grade  Interpreter Needed?: No      Activities of Daily Living    05/10/2023    8:37 AM 08/04/2022    9:49 AM  In your present state of health, do you have any difficulty performing the following activities:  Hearing? 0 0  Vision? 1 1  Difficulty concentrating or making decisions? 0 0  Walking or climbing stairs? 0 0  Dressing or bathing? 0 0  Doing errands, shopping? 0   Preparing Food and eating ? N   Using the Toilet? N   In the past six months, have you accidently leaked urine? N   Do you have problems with loss of bowel control? N   Managing your Medications? N   Managing your Finances? N   Housekeeping or managing your Housekeeping? N     Patient Care Team: Austine Lefort, MD as PCP - General (Family Medicine)  Indicate any recent Medical Services you may have received from other than [facility] providers in the past year (date may be approximate).     Assessment:   This is a routine wellness examination for [place].  Hearing/Vision screen No results found.   Goals Addressed   None   Depression Screen    05/10/2023    8:48 AM 09/24/2021    9:07 AM 10/24/2020    8:23 AM 07/14/2020   12:20 PM 10/18/2017    3:52 PM 09/15/2016   11:45 AM 05/31/2016    9:23 AM  PHQ 2/9 Scores  PHQ - 2 Score  0 0 0 0 0 0  Exception Documentation Patient refusal          Fall Risk    05/10/2023    8:39 AM 09/24/2021    9:08 AM 10/24/2020    8:23 AM 07/14/2020   12:20 PM 10/18/2017    3:52 PM  Fall Risk   Falls in the past year? 0 0 0 0 No  Number falls in past yr: 0 0 0 0   Injury with Fall? 0 0 0 0   Risk for fall due to : No Fall Risks History of fall(s) No Fall Risks Impaired balance/gait;Impaired  mobility   Follow up Falls evaluation completed Falls prevention discussed Falls evaluation completed Falls evaluation completed     MEDICARE RISK AT HOME: Medicare Risk at Home Any stairs in or around  the home?: Yes If so, are there any without handrails?: No Home free of loose throw rugs in walkways, pet beds, electrical cords, etc?: Yes Adequate lighting in your home to reduce risk of falls?: Yes Life alert?: No Use of a cane, walker or w/c?: No Grab bars in the bathroom?: No Shower chair or bench in shower?: No Elevated toilet seat or a handicapped toilet?: No  TIMED UP AND GO:  Was the test performed?  No    Cognitive Function:    Patient refused CIT    Immunizations Immunization History  Administered Date(s) Administered   Fluad Quad(high Dose 65+) 10/19/2018, 10/11/2019, 10/24/2020, 11/23/2021   Fluad Trivalent(High Dose 65+) 10/29/2022   Influenza Whole 10/30/2008, 10/26/2011   Influenza, High Dose Seasonal PF 11/02/2017   Influenza,inj,Quad PF,6+ Mos 10/12/2012, 10/23/2013, 11/12/2014, 10/28/2015, 10/22/2016   PFIZER(Purple Top)SARS-COV-2 Vaccination 02/14/2018, 03/08/2019, 11/12/2019   Pfizer Covid-19 Vaccine Bivalent Booster 25yrs & up 12/08/2020, 06/09/2021   Pfizer(Comirnaty)Fall Seasonal Vaccine 12 years and older 12/08/2021, 11/01/2022   Pneumococcal Conjugate-13 01/01/2013   Pneumococcal Polysaccharide-23 03/01/2005, 01/26/2007   Respiratory Syncytial Virus Vaccine,Recomb Aduvanted(Arexvy) 12/08/2021   Tdap 12/17/2009   Zoster Recombinant(Shingrix) 02/10/2017, 05/12/2017   Zoster, Live 03/14/2006    TDAP status: Up to date  Flu Vaccine status: Up to date  Pneumococcal vaccine status: Up to date  Covid-19 vaccine status: Completed vaccines  Qualifies for Shingles Vaccine? Yes   Zostavax completed Yes   Shingrix Completed?: Yes  Screening Tests Health Maintenance  Topic Date Due   COVID-19 Vaccine (8 - Pfizer risk 2024-25 season) 05/02/2023   INFLUENZA VACCINE  08/26/2023   Medicare Annual Wellness (AWV)  05/09/2024   Pneumonia Vaccine 73+ Years old  Completed   DEXA SCAN  Completed   Zoster Vaccines- Shingrix  Completed   HPV VACCINES  Aged  Out   Meningococcal B Vaccine  Aged Out   DTaP/Tdap/Td  Discontinued    Health Maintenance  Health Maintenance Due  Topic Date Due   COVID-19 Vaccine (8 - Pfizer risk 2024-25 season) 05/02/2023    Lung Cancer Screening: (Low Dose CT Chest recommended if Age 73-80 years, 20 pack-year currently smoking OR have quit w/in 15years.) does not qualify.   Lung Cancer Screening Referral:   Additional Screening:  Hepatitis C Screening: does qualify; Completed   Vision Screening: Recommended annual ophthalmology exams for early detection of glaucoma and other disorders of the eye. Is the patient up to date with their annual eye exam?  Yes  Who is the provider or what is the name of the office in which the patient attends annual eye exams? [facility] If pt is not established with a provider, would they like to be referred to a provider to establish care? No .   Dental Screening: Recommended annual dental exams for proper oral hygiene  Diabetic Foot Exam:   Community Resource Referral / Chronic Care Management: CRR required this visit?  No   CCM required this visit?  No     Plan:     I have personally reviewed and noted the following in the patient's chart:   Medical and social history Use of alcohol, tobacco or illicit drugs  Current medications and supplements including opioid prescriptions. Patient is not currently taking opioid prescriptions. Functional ability and  status Nutritional status Physical activity Advanced directives List of other physicians Hospitalizations, surgeries, and ER visits in previous 12 months Vitals Screenings to include cognitive, depression, and falls Referrals and appointments  In addition, I have reviewed and discussed with patient certain preventive protocols, quality metrics, and best practice recommendations. A written personalized care plan for preventive services as well as general preventive health recommendations were provided  to patient.     Alvina Axon   05/10/2023   After Visit Summary: (MyChart) Due to this being a telephonic visit, the after visit summary with patients personalized plan was offered to patient via MyChart   Nurse Notes:   Ms. Goosby , Thank you for taking time to come for your Medicare Wellness Visit. I appreciate your ongoing commitment to your health goals. Please review the following plan we discussed and let me know if I can assist you in the future.   These are the goals we discussed:  Goals   None     This is a list of the screening recommended for you and due dates:  Health Maintenance  Topic Date Due   COVID-19 Vaccine (8 - Pfizer risk 2024-25 season) 05/02/2023   Flu Shot  08/26/2023   Medicare Annual Wellness Visit  05/09/2024   Pneumonia Vaccine  Completed   DEXA scan (bone density measurement)  Completed   Zoster (Shingles) Vaccine  Completed   HPV Vaccine  Aged Out   Meningitis B Vaccine  Aged Out   DTaP/Tdap/Td vaccine  Discontinued

## 2023-05-13 ENCOUNTER — Other Ambulatory Visit: Payer: Self-pay | Admitting: Family Medicine

## 2023-05-13 DIAGNOSIS — R079 Chest pain, unspecified: Secondary | ICD-10-CM

## 2023-05-16 NOTE — Telephone Encounter (Signed)
 Requested Prescriptions  Pending Prescriptions Disp Refills   pantoprazole  (PROTONIX ) 40 MG tablet [Pharmacy Med Name: PANTOPRAZOLE  SOD DR 40 MG TAB] 90 tablet 0    Sig: TAKE 1 TABLET BY MOUTH EVERY DAY     Gastroenterology: Proton Pump Inhibitors Passed - 05/16/2023  8:50 AM      Passed - Valid encounter within last 12 months    Recent Outpatient Visits           6 days ago Encounter for Medicare annual wellness exam   [place] [facility] Medicine Austine Lefort, MD   1 week ago Atrial fibrillation, unspecified type [facility])   [place] [facility] Family Medicine Austine Lefort, MD   9 months ago Stage 3b chronic kidney disease [facility])   [place] [facility] Medicine Austine Lefort, MD   1 year ago Stage 3b chronic kidney disease [facility])   [place] [facility] Family Medicine Pickard, Cisco Crest, MD   1 year ago Atrial fibrillation, unspecified type [facility])   [place] [facility] Family Medicine Pickard, Cisco Crest, MD

## 2023-05-17 NOTE — Telephone Encounter (Signed)
 Patient had med refill appointment on 05/05/23.

## 2023-05-21 ENCOUNTER — Other Ambulatory Visit: Payer: Self-pay | Admitting: Family Medicine

## 2023-05-21 DIAGNOSIS — E782 Mixed hyperlipidemia: Secondary | ICD-10-CM

## 2023-05-23 NOTE — Telephone Encounter (Signed)
 Requested Prescriptions  Pending Prescriptions Disp Refills   atorvastatin  (LIPITOR) 20 MG tablet [Pharmacy Med Name: ATORVASTATIN  20 MG TABLET] 90 tablet 0    Sig: TAKE 1 TABLET BY MOUTH EVERY DAY     Cardiovascular:  Antilipid - Statins Failed - 05/23/2023 12:21 PM      Failed - Lipid Panel in normal range within the last 12 months    Cholesterol  Date Value Ref Range Status  05/05/2023 139 <200 mg/dL Final   LDL Cholesterol (Calc)  Date Value Ref Range Status  05/05/2023 56 mg/dL (calc) Final    Comment:    Reference range: <100 . Desirable range <100 mg/dL for primary prevention;   <70 mg/dL for patients with CHD or diabetic patients  with > or = 2 CHD risk factors. Aaron Aas LDL-C is now calculated using the Martin-Hopkins  calculation, which is a validated novel method providing  better accuracy than the Friedewald equation in the  estimation of LDL-C.  Melinda Sprawls et al. Erroll Heard. 1610;960(45): 2061-2068  (http://education.[employer].com/faq/FAQ164)    Direct LDL  Date Value Ref Range Status  05/25/2011 151.1 mg/dL Final    Comment:    Optimal:  <100 mg/dLNear or Above Optimal:  100-129 mg/dLBorderline High:  130-159 mg/dLHigh:  160-189 mg/dLVery High:  >190 mg/dL   HDL  Date Value Ref Range Status  05/05/2023 68 > OR = 50 mg/dL Final   Triglycerides  Date Value Ref Range Status  05/05/2023 74 <150 mg/dL Final         Passed - Patient is not pregnant      Passed - Valid encounter within last 12 months    Recent Outpatient Visits           1 week ago Encounter for Medicare annual wellness exam   [place] [facility] Family Medicine Austine Lefort, MD   2 weeks ago Atrial fibrillation, unspecified type [facility]   [facility] Austine Lefort, MD   9 months ago Stage 3b chronic kidney disease [facility])   [place] [facility] Family Medicine Austine Lefort, MD   1 year ago Stage 3b chronic kidney disease [facility])   [place]  [facility] Family Medicine Pickard, Cisco Crest, MD   1 year ago Atrial fibrillation, unspecified type [facility])   [place] [facility] Family Medicine Pickard, Cisco Crest, MD

## 2023-05-25 ENCOUNTER — Other Ambulatory Visit: Payer: Self-pay | Admitting: Family Medicine

## 2023-06-12 ENCOUNTER — Other Ambulatory Visit: Payer: Self-pay | Admitting: Family Medicine

## 2023-06-12 DIAGNOSIS — E038 Other specified hypothyroidism: Secondary | ICD-10-CM

## 2023-06-14 NOTE — Telephone Encounter (Signed)
 Lab 05/05/23- continue current medication  Requested Prescriptions  Pending Prescriptions Disp Refills   levothyroxine  (SYNTHROID ) 100 MCG tablet [Pharmacy Med Name: LEVOTHYROXINE  100 MCG TABLET] 45 tablet 3    Sig: TAKE 1 TABLET (100 MCG TOTAL) BY MOUTH EVERY OTHER DAY.     Endocrinology:  Hypothyroid Agents Passed - 06/14/2023  3:19 PM      Passed - TSH in normal range and within 360 days    TSH  Date Value Ref Range Status  05/05/2023 2.22 0.40 - 4.50 mIU/L Final         Passed - Valid encounter within last 12 months    Recent Outpatient Visits           1 month ago Encounter for Medicare annual wellness exam   [place] [facility] Medicine Austine Lefort, MD   1 month ago Atrial fibrillation, unspecified type [facility])   [place] [facility] Family Medicine Austine Lefort, MD   10 months ago Stage 3b chronic kidney disease [facility])   [place] [facility] Family Medicine Austine Lefort, MD   1 year ago Stage 3b chronic kidney disease [facility])   [place] [facility] Medicine Austine Lefort, MD   1 year ago Atrial fibrillation, unspecified type [facility])   [place] [facility] Family Medicine Pickard, Cisco Crest, MD

## 2023-06-22 ENCOUNTER — Telehealth: Payer: Self-pay

## 2023-06-22 NOTE — Telephone Encounter (Signed)
 Prescription Request  06/22/2023  LOV: 05/10/23  What is the name of the medication or equipment? diltiazem  (CARDIZEM ) 30 MG tablet [[id number]]   Have you contacted your pharmacy to request a refill? Yes   Which pharmacy would you like this sent to?  [id number]ette Nestle, [place] - [address]  [place] [address] Phone: [phone] Fax: [phone]    Patient notified that their request is being sent to the clinical staff for review and that they should receive a response within 2 business days.   Please advise at [facility] [phone]

## 2023-07-15 ENCOUNTER — Telehealth: Payer: Self-pay

## 2023-07-15 ENCOUNTER — Other Ambulatory Visit: Payer: Self-pay

## 2023-07-15 DIAGNOSIS — Z8673 Personal history of transient ischemic attack (TIA), and cerebral infarction without residual deficits: Secondary | ICD-10-CM

## 2023-07-15 MED ORDER — RIVAROXABAN 15 MG PO TABS
15.0000 mg | ORAL_TABLET | Freq: Every day | ORAL | 0 refills | Status: DC
Start: 2023-07-15 — End: 2023-10-20

## 2023-07-15 NOTE — Telephone Encounter (Signed)
 Prescription Request  07/15/2023  LOV: 05/05/23  What is the name of the medication or equipment? XARELTO  15 MG TABS tablet [[id number]]   Have you contacted your pharmacy to request a refill? Yes   Which pharmacy would you like this sent to?  [id number]ette Nestle, [place] - [address] [place] [address] Phone: [phone] Fax: [phone]    Patient notified that their request is being sent to the clinical staff for review and that they should receive a response within 2 business days.   Please advise at [facility] [phone]

## 2023-08-21 ENCOUNTER — Other Ambulatory Visit: Payer: Self-pay | Admitting: Family Medicine

## 2023-08-21 DIAGNOSIS — E782 Mixed hyperlipidemia: Secondary | ICD-10-CM

## 2023-09-05 ENCOUNTER — Telehealth: Payer: Self-pay | Admitting: Family Medicine

## 2023-09-05 ENCOUNTER — Other Ambulatory Visit: Payer: Self-pay

## 2023-09-05 DIAGNOSIS — E038 Other specified hypothyroidism: Secondary | ICD-10-CM

## 2023-09-05 MED ORDER — LEVOTHYROXINE SODIUM 112 MCG PO TABS: 112.0000 ug | ORAL_TABLET | ORAL | 1 refills | Status: DC

## 2023-09-05 NOTE — Telephone Encounter (Signed)
 Prescription Request  09/05/2023  LOV: 05/10/2023  What is the name of the medication or equipment?   levothyroxine  (SYNTHROID ) 112 MCG tablet   Have you contacted your pharmacy to request a refill? Yes   Which pharmacy would you like this sent to?  [id number] [place], [place] - [address] [place] [address] Phone: [phone] Fax: [phone]    Patient notified that their request is being sent to the clinical staff for review and that they should receive a response within 2 business days.   Please advise pharmacist.

## 2023-09-07 ENCOUNTER — Telehealth: Payer: Self-pay

## 2023-09-07 NOTE — Telephone Encounter (Signed)
 Copied from CRM [id number]. Topic: Clinical - Request for Lab/Test Order >> Sep 07, 2023  8:17 AM Wyona SQUIBB wrote: Reason for CRM: PT daughter Jon called in stating she needed blood work for her upcoming visit with [employer], advised if pt had blood work order form with her, Jon said no and did not see blood work order.    Jon states blood is always drawn without an order. Pls fllw up with Jon and pt. >> Sep 07, 2023  8:34 AM Robinson H wrote: Patients daughter called back regarding message sent earlier, agent tried to explain to Jon that no labs are in for patient, and any labs that [facility] want can't be done at office. Patients daughter states she will walk in to the clinic anyway.

## 2023-09-07 NOTE — Telephone Encounter (Signed)
 Called pt daughter to get clarification on what is needed.

## 2023-09-07 NOTE — Telephone Encounter (Signed)
 Pt daughter states that they have gotten this taken care of.

## 2023-10-02 ENCOUNTER — Other Ambulatory Visit: Payer: Self-pay | Admitting: Family Medicine

## 2023-10-02 DIAGNOSIS — R079 Chest pain, unspecified: Secondary | ICD-10-CM

## 2023-10-02 NOTE — Telephone Encounter (Signed)
 Prescription Request  10/02/2023  LOV: 05/10/2023  What is the name of the medication or equipment? pantoprazole  (PROTONIX ) 40 MG tablet   Have you contacted your pharmacy to request a refill? Yes   Which pharmacy would you like this sent to?  [id number] [place], [place] - [address] [place] [address] Phone: [phone] Fax: [phone]    Patient notified that their request is being sent to the clinical staff for review and that they should receive a response within 2 business days.   Please advise at [facility] [phone]

## 2023-10-04 MED ORDER — PANTOPRAZOLE SODIUM 40 MG PO TBEC: 40.0000 mg | DELAYED_RELEASE_TABLET | Freq: Every day | ORAL | 0 refills | Status: DC

## 2023-10-04 NOTE — Telephone Encounter (Signed)
 Requested Prescriptions  Pending Prescriptions Disp Refills   pantoprazole  (PROTONIX ) 40 MG tablet 90 tablet 0    Sig: Take 1 tablet (40 mg total) by mouth daily.     Gastroenterology: Proton Pump Inhibitors Passed - 10/04/2023  8:42 AM      Passed - Valid encounter within last 12 months    Recent Outpatient Visits           4 months ago Encounter for Medicare annual wellness exam   [place] [facility] Duanne Butler DASEN, MD   5 months ago Atrial fibrillation, unspecified type [facility])   [place] [facility] Duanne Butler DASEN, MD   1 year ago Stage 3b chronic kidney disease [facility])   [place] [facility] Duanne Butler DASEN, MD   1 year ago Stage 3b chronic kidney disease [facility])   [place] [facility] Duanne Butler DASEN, MD   1 year ago Atrial fibrillation, unspecified type [facility])   [place] [facility] Pickard, Butler DASEN, MD

## 2023-10-20 ENCOUNTER — Other Ambulatory Visit: Payer: Self-pay | Admitting: Family Medicine

## 2023-10-20 ENCOUNTER — Telehealth: Payer: Self-pay

## 2023-10-20 ENCOUNTER — Other Ambulatory Visit: Payer: Self-pay

## 2023-10-20 DIAGNOSIS — Z8673 Personal history of transient ischemic attack (TIA), and cerebral infarction without residual deficits: Secondary | ICD-10-CM

## 2023-10-20 MED ORDER — RIVAROXABAN 15 MG PO TABS: 15.0000 mg | ORAL_TABLET | Freq: Every day | ORAL | 0 refills | Status: AC

## 2023-10-20 NOTE — Telephone Encounter (Signed)
 Copied from CRM [id number]. Topic: Clinical - Medication Question >> Oct 20, 2023 10:21 AM Carlatta H wrote: Reason for CRM: Please call the patients daughter Jon [phone]//She would like to know if the office has any XARELTO  15 MG TABS tablet samples due to the pharmacy being out of stock

## 2023-10-20 NOTE — Telephone Encounter (Signed)
 Sent to [place] [facility]

## 2023-10-20 NOTE — Telephone Encounter (Unsigned)
 Copied from CRM #[id number]. Topic: Clinical - Medication Refill >> Oct 20, 2023 10:20 AM Carlatta H wrote: Medication: XARELTO  15 MG TABS tablet  Has the patient contacted their pharmacy? Yes (Agent: If no, request that the patient contact the pharmacy for the refill. If patient does not wish to contact the pharmacy document the reason why and proceed with request.) (Agent: If yes, when and what did the pharmacy advise?) Pharmacy has none in stock  This is the patient's preferred pharmacy:    [facility] [place] [place] - [address] [place] [address] Phone: [phone] Fax: [phone]  Is this the correct pharmacy for this prescription? Yes If no, delete pharmacy and type the correct one.   Has the prescription been filled recently? No  Is the patient out of the medication? Yes  Has the patient been seen for an appointment in the last year OR does the patient have an upcoming appointment? No  Can we respond through MyChart? No  Agent: Please be advised that Rx refills may take up to 3 business days. We ask that you follow-up with your pharmacy.

## 2023-11-03 ENCOUNTER — Ambulatory Visit (INDEPENDENT_AMBULATORY_CARE_PROVIDER_SITE_OTHER)

## 2023-11-03 DIAGNOSIS — Z23 Encounter for immunization: Secondary | ICD-10-CM

## 2023-11-03 NOTE — Progress Notes (Signed)
 Patient is in office today for a nurse visit for HD flu Immunization. Patient Injection was given in the  Left deltoid. Patient tolerated injection well.  Elida GORMAN Manila, CMA

## 2023-11-21 ENCOUNTER — Other Ambulatory Visit: Payer: Self-pay

## 2023-11-21 DIAGNOSIS — E782 Mixed hyperlipidemia: Secondary | ICD-10-CM

## 2023-11-21 NOTE — Telephone Encounter (Signed)
 Prescription Request  11/21/2023  LOV: 05/10/23  What is the name of the medication or equipment? atorvastatin  (LIPITOR) 20 MG tablet [[id number]]   Have you contacted your pharmacy to request a refill? Yes   Which pharmacy would you like this sent to?  [id number] [place], [place] [facility] [address] [place] [address] Phone: [phone] Fax: [phone]    Patient notified that their request is being sent to the clinical staff for review and that they should receive a response within 2 business days.   Please advise at [facility] [phone]

## 2023-11-22 ENCOUNTER — Encounter: Payer: Self-pay | Admitting: Family Medicine

## 2023-11-22 ENCOUNTER — Ambulatory Visit: Admitting: Family Medicine

## 2023-11-22 ENCOUNTER — Ambulatory Visit: Payer: Self-pay

## 2023-11-22 VITALS — BP 128/60 | HR 67 | Temp 97.6°F | Ht 69.6 in | Wt 143.0 lb

## 2023-11-22 DIAGNOSIS — H1131 Conjunctival hemorrhage, right eye: Secondary | ICD-10-CM

## 2023-11-22 LAB — COMPREHENSIVE METABOLIC PANEL WITH GFR
AG Ratio: 1.4 (calc) (ref 1.0–2.5)
ALT: 22 U/L (ref 6–29)
AST: 30 U/L (ref 10–35)
Albumin: 4.2 g/dL (ref 3.6–5.1)
Alkaline phosphatase (APISO): 149 U/L (ref 37–153)
BUN/Creatinine Ratio: 20 (calc) (ref 6–22)
BUN: 36 mg/dL — ABNORMAL HIGH (ref 7–25)
CO2: 24 mmol/L (ref 20–32)
Calcium: 9.6 mg/dL (ref 8.6–10.4)
Chloride: 105 mmol/L (ref 98–110)
Creat: 1.76 mg/dL — ABNORMAL HIGH (ref 0.60–0.95)
Globulin: 3 g/dL (ref 1.9–3.7)
Glucose, Bld: 101 mg/dL — ABNORMAL HIGH (ref 65–99)
Potassium: 4 mmol/L (ref 3.5–5.3)
Sodium: 139 mmol/L (ref 135–146)
Total Bilirubin: 0.7 mg/dL (ref 0.2–1.2)
Total Protein: 7.2 g/dL (ref 6.1–8.1)
eGFR: 28 mL/min/1.73m2 — ABNORMAL LOW (ref >=60)

## 2023-11-22 LAB — CBC WITH DIFFERENTIAL/PLATELET
Absolute Lymphocytes: 725 {cells}/uL — ABNORMAL LOW (ref 850–3900)
Absolute Monocytes: 422 {cells}/uL (ref 200–950)
Basophils Absolute: 19 {cells}/uL (ref 0–200)
Basophils Relative: 0.4 %
Eosinophils Absolute: 110 {cells}/uL (ref 15–500)
Eosinophils Relative: 2.3 %
HCT: 32.7 % — ABNORMAL LOW (ref 35.0–45.0)
Hemoglobin: 10.7 g/dL — ABNORMAL LOW (ref 11.7–15.5)
MCH: 30.7 pg (ref 27.0–33.0)
MCHC: 32.7 g/dL (ref 32.0–36.0)
MCV: 94 fL (ref 80.0–100.0)
MPV: 10.5 fL (ref 7.5–12.5)
Monocytes Relative: 8.8 %
Neutro Abs: 3523 {cells}/uL (ref 1500–7800)
Neutrophils Relative %: 73.4 %
Platelets: 159 Thousand/uL (ref 140–400)
RBC: 3.48 Million/uL — ABNORMAL LOW (ref 3.80–5.10)
RDW: 12.9 % (ref 11.0–15.0)
Total Lymphocyte: 15.1 %
WBC: 4.8 Thousand/uL (ref 3.8–10.8)

## 2023-11-22 MED ORDER — ATORVASTATIN CALCIUM 20 MG PO TABS: 20.0000 mg | ORAL_TABLET | Freq: Every day | ORAL | 0 refills | Status: DC

## 2023-11-22 NOTE — Progress Notes (Signed)
 Subjective:    Patient ID: Jocelyn Moss, female    DOB: 11-12-1935, 88 y.o.   MRN: [id number]  HPI Patient woke up yesterday with a subconjunctival hemorrhage in her left eye.  There is frank blood surrounding her iris in her left eye.  She denies any pain.  She denies any blurry vision.  She denies any pain with extraocular motion.  She denies any photophobia. Past Medical History:  Diagnosis Date   Atrial fibrillation (HCC)    Breast cancer (HCC) 1990   Chest pain    Colon cancer (HCC)    ileostomy 2000   Dysrhythmia    History of atrial fibrillation    History of colon cancer    History of CVA (cerebrovascular accident)    Hypercholesterolemia    Hypothyroidism    Mild mitral regurgitation by prior echocardiogram    Multiple lacunar infarcts (HCC)    MVP (mitral valve prolapse)    Osteoporosis    osteopenia   Personal history of chemotherapy    6 months   Personal history of radiation therapy    6 months   Renal cell carcinoma (HCC)    stage 1 s/p resection Dr. Alvaro 2020   SOB (shortness of breath)    Stage 3b chronic kidney disease (CKD) (HCC)    Past Surgical History:  Procedure Laterality Date   BREAST BIOPSY     BREAST LUMPECTOMY Left    CATARACT EXTRACTION W/PHACO Right 08/04/2022   Procedure: CATARACT EXTRACTION PHACO AND INTRAOCULAR LENS PLACEMENT (IOC) RIGHT 4.74 00:32.9;  Surgeon: Mittie Gaskin, MD;  Location: [facility];  Service: Ophthalmology;  Laterality: Right;   COLON SURGERY  2000   ileostomy   ILEOSTOMY     ROBOTIC ASSITED PARTIAL NEPHRECTOMY Right 02/01/2018   Procedure: XI ROBOTIC ASSISTED RETROPERITONEAL PARTIAL NEPHRECTOMY;  Surgeon: Alvaro Hummer, MD;  Location: [facility];  Service: Urology;  Laterality: Right;   Current Outpatient Medications on File Prior to Visit  Medication Sig Dispense Refill   acetaminophen  (TYLENOL ) 500 MG tablet Take 500 mg by mouth every 6 (six) hours as needed (pain).     atorvastatin  (LIPITOR) 20  MG tablet TAKE 1 TABLET BY MOUTH EVERY DAY 90 tablet 0   calcitRIOL (ROCALTROL) 0.25 MCG capsule Take 0.25 mcg by mouth daily. Take 1 by mouth every other day     calcium  citrate (CALCITRATE - DOSED IN MG ELEMENTAL CALCIUM ) 950 (200 Ca) MG tablet Take 200 mg of elemental calcium  by mouth daily. OTC     diltiazem  (CARDIZEM ) 30 MG tablet TAKE 1 TABLET (30 MG TOTAL) BY MOUTH 4 (FOUR) TIMES DAILY AS NEEDED (FAST HEART RATE ABOVE 110). 90 tablet 0   ferrous gluconate (FERGON) 324 MG tablet Take 324 mg by mouth 2 (two) times daily.     fluticasone  (FLONASE ) 50 MCG/ACT nasal spray Place into the nose.     fluticasone  (FLONASE ) 50 MCG/ACT nasal spray Place 2 sprays into both nostrils daily. (Patient not taking: Reported on 12/04/2021) 16 g 6   levothyroxine  (SYNTHROID ) 100 MCG tablet TAKE 1 TABLET (100 MCG TOTAL) BY MOUTH EVERY OTHER DAY. 45 tablet 3   levothyroxine  (SYNTHROID ) 112 MCG tablet Take 1 tablet (112 mcg total) by mouth every other day. 45 tablet 1   metoprolol  tartrate (LOPRESSOR ) 25 MG tablet TAKE 1/2 TABLET (12.5 MG TOTAL) BY MOUTH 2 (TWO) TIMES DAILY AS NEEDED (PALPITATIONS). 180 tablet 1   mometasone -formoterol  (DULERA ) 100-5 MCG/ACT AERO Inhale 2 puffs into the lungs  in the morning and at bedtime. 39 g 2   Multiple Vitamins-Minerals (PRESERVISION AREDS 2) CAPS Take 1 capsule by mouth 2 (two) times daily.     pantoprazole  (PROTONIX ) 40 MG tablet Take 1 tablet (40 mg total) by mouth daily. 90 tablet 0   promethazine  (PHENERGAN ) 25 MG tablet Take 1 tablet (25 mg total) by mouth every 8 (eight) hours as needed for nausea or vomiting. 20 tablet 0   Respiratory Therapy Supplies (FLUTTER) DEVI 1 Device by Does not apply route as directed. 1 each 0   Rivaroxaban  (XARELTO ) 15 MG TABS tablet Take 1 tablet (15 mg total) by mouth daily with supper. 90 tablet 0   No current facility-administered medications on file prior to visit.   Allergies  Allergen Reactions   Crestor [Rosuvastatin Calcium ]  Other (See Comments)    Joint pain, effected liver function    Zetia [Ezetimibe] Other (See Comments)    Joint pain, effected liver function    Social History   Socioeconomic History   Marital status: Married    Spouse name: Not on file   Number of children: Not on file   Years of education: Not on file   Highest education level: Not on file  Occupational History   Not on file  Tobacco Use   Smoking status: Never   Smokeless tobacco: Never  Vaping Use   Vaping status: Never Used  Substance and Sexual Activity   Alcohol use: No   Drug use: No   Sexual activity: Not on file  Other Topics Concern   Not on file  Social History Narrative   Not on file   Social Drivers of Health   Financial Resource Strain: Patient Declined (05/10/2023)   Overall Financial Resource Strain (CARDIA)    Difficulty of Paying Living Expenses: Patient declined  Food Insecurity: No Food Insecurity (05/10/2023)   Hunger Vital Sign    Worried About Running Out of Food in the Last Year: Never true    Ran Out of Food in the Last Year: Never true  Transportation Needs: No Transportation Needs (05/10/2023)   PRAPARE - Administrator, Civil Service (Medical): No    Lack of Transportation (Non-Medical): No  Physical Activity: Patient Declined (05/10/2023)   Exercise Vital Sign    Days of Exercise per Week: Patient declined    Minutes of Exercise per Session: Patient declined  Stress: Patient Declined (05/10/2023)   [employer] of Occupational Health - Occupational Stress Questionnaire    Feeling of Stress : Patient declined  Social Connections: Patient Declined (05/10/2023)   Social Connection and Isolation Panel    Frequency of Communication with Friends and Family: Patient declined    Frequency of Social Gatherings with Friends and Family: Patient declined    Attends Religious Services: Patient declined    Database Administrator or Organizations: Patient declined    Attends Tax Inspector Meetings: Patient declined    Marital Status: Patient declined  Intimate Partner Violence: Not At Risk (05/10/2023)   Humiliation, Afraid, Rape, and Kick questionnaire    Fear of Current or Ex-Partner: No    Emotionally Abused: No    Physically Abused: No    Sexually Abused: No       Review of Systems  All other systems reviewed and are negative.      Objective:   Physical Exam Vitals reviewed.  Constitutional:      General: She is not in acute distress.  Appearance: Normal appearance. She is normal weight. She is not ill-appearing or toxic-appearing.  Eyes:     General: Lids are normal. Lids are everted, no foreign bodies appreciated.        Right eye: No discharge.        Left eye: No discharge.     Conjunctiva/sclera:     Right eye: Right conjunctiva is not injected. No chemosis or exudate.    Left eye: Left conjunctiva is not injected. Hemorrhage present. No chemosis or exudate.    Pupils: Pupils are equal, round, and reactive to light.  Cardiovascular:     Rate and Rhythm: Normal rate. Rhythm irregular.     Pulses: Normal pulses.     Heart sounds: Normal heart sounds. No murmur heard.    No friction rub. No gallop.  Pulmonary:     Effort: Pulmonary effort is normal. No respiratory distress.     Breath sounds: Normal breath sounds. No stridor.  Musculoskeletal:     Right lower leg: No edema.     Left lower leg: No edema.  Neurological:     General: No focal deficit present.     Mental Status: She is alert and oriented to person, place, and time. Mental status is at baseline.     Cranial Nerves: No cranial nerve deficit.     Sensory: No sensory deficit.     Motor: No weakness.     Coordination: Coordination normal.     Gait: Gait normal.        Assessment & Plan:  Subconjunctival hemorrhage of right eye - Plan: CBC with Differential/Platelet, Comprehensive metabolic panel with GFR Reassured patient that the bleeding in her left eye is not  anything dangerous and will resolve spontaneously.  Recommended holding Xarelto  for 48 hours.  Recheck immediately if she develops eye pain or blurry vision

## 2023-11-22 NOTE — Telephone Encounter (Signed)
 Requested Prescriptions  Pending Prescriptions Disp Refills   atorvastatin  (LIPITOR) 20 MG tablet 90 tablet 0    Sig: Take 1 tablet (20 mg total) by mouth daily.     Cardiovascular:  Antilipid - Statins Failed - 11/22/2023  2:49 PM      Failed - Lipid Panel in normal range within the last 12 months    Cholesterol  Date Value Ref Range Status  05/05/2023 139 <200 mg/dL Final   LDL Cholesterol (Calc)  Date Value Ref Range Status  05/05/2023 56 mg/dL (calc) Final    Comment:    Reference range: <100 . Desirable range <100 mg/dL for primary prevention;   <70 mg/dL for patients with CHD or diabetic patients  with > or = 2 CHD risk factors. SABRA LDL-C is now calculated using the Martin-Hopkins  calculation, which is a validated novel method providing  better accuracy than the Friedewald equation in the  estimation of LDL-C.  Gladis APPLETHWAITE et al. SANDREA. 7986;689(80): 2061-2068  (http://education.[facility].com/faq/FAQ164)    Direct LDL  Date Value Ref Range Status  05/25/2011 151.1 mg/dL Final    Comment:    Optimal:  <100 mg/dLNear or Above Optimal:  100-129 mg/dLBorderline High:  130-159 mg/dLHigh:  160-189 mg/dLVery High:  >190 mg/dL   HDL  Date Value Ref Range Status  05/05/2023 68 > OR = 50 mg/dL Final   Triglycerides  Date Value Ref Range Status  05/05/2023 74 <150 mg/dL Final         Passed - Patient is not pregnant      Passed - Valid encounter within last 12 months    Recent Outpatient Visits           Today Subconjunctival hemorrhage of right eye   [place] [facility] Medicine Duanne Butler DASEN, MD   6 months ago Encounter for Medicare annual wellness exam   [place] [facility] Duanne Butler DASEN, MD   6 months ago Atrial fibrillation, unspecified type [facility])   [place] [facility] Duanne Butler DASEN, MD   1 year ago Stage 3b chronic kidney disease [facility])   [place] [facility] Duanne Butler DASEN, MD   1 year ago Stage 3b chronic kidney disease [facility])   [place] [facility] Pickard, Butler DASEN, MD

## 2023-11-22 NOTE — Telephone Encounter (Signed)
 FYI Only or Action Required?: FYI only for provider.  Patient was last seen in primary care on 05/10/2023 by Duanne Butler DASEN, MD.  Called Nurse Triage reporting Eye Problem.  Symptoms began yesterday.  Interventions attempted: Rest, hydration, or home remedies.  Symptoms are: stable.  Triage Disposition: See Physician Within 24 Hours  Patient/caregiver understands and will follow disposition?: Yes            Copied from CRM [id number]. Topic: Clinical - Red Word Triage >> Nov 22, 2023  8:35 AM Everette C wrote: [facility] that prompted transfer to Nurse Triage: The patient's daughter is concerned that they may be experiencing an issue with their blood thinning medication. The patient's left eye is bright red and their right arm is much darker than normal. Reason for Disposition  [1] Bleeding on white of the eye AND [2] taking Coumadin (warfarin) or other strong blood thinner, or known bleeding disorder (e.g., thrombocytopenia)  Answer Assessment - Initial Assessment Questions 1. LOCATION: Where is the redness? (e.g., eyeball or outer eyelids) Note: When callers say the eye is red, they usually mean the sclera (white of the eye) is red.       Left eye, sclera red like bleeding 2. ONE OR BOTH EYES: Is the redness in one or both eyes?      One 3. ONSET: When did the redness start? (e.g., hours, days)      Yesterday 4. EYELIDS: Are the eyelids red or swollen? If Yes, ask: How much?      None 5. VISION: Do you have blurred vision?     None 6. ITCHING: Does it feel itchy? If so ask: How bad is it (Scale 1-10; or mild, moderate, severe)     None 7. PAIN: Is it painful? If Yes, ask: How bad is the pain? (Scale 0-10; or none, mild, moderate, severe)     None  Protocols used: Eye - Redness-A-AH

## 2023-11-24 ENCOUNTER — Ambulatory Visit: Payer: Self-pay | Admitting: Family Medicine

## 2023-12-01 ENCOUNTER — Other Ambulatory Visit: Payer: Self-pay | Admitting: Family Medicine

## 2023-12-01 DIAGNOSIS — I4891 Unspecified atrial fibrillation: Secondary | ICD-10-CM

## 2023-12-01 DIAGNOSIS — R009 Unspecified abnormalities of heart beat: Secondary | ICD-10-CM

## 2023-12-05 ENCOUNTER — Telehealth: Payer: Self-pay

## 2023-12-05 ENCOUNTER — Other Ambulatory Visit: Payer: Self-pay

## 2023-12-05 DIAGNOSIS — E038 Other specified hypothyroidism: Secondary | ICD-10-CM

## 2023-12-05 MED ORDER — LEVOTHYROXINE SODIUM 100 MCG PO TABS: 100.0000 ug | ORAL_TABLET | ORAL | 3 refills | Status: AC

## 2023-12-05 NOTE — Telephone Encounter (Signed)
 Sent in medication

## 2023-12-05 NOTE — Telephone Encounter (Signed)
 Prescription Request  12/05/2023  LOV: 11/22/23  What is the name of the medication or equipment? levothyroxine  (SYNTHROID ) 100 MCG tablet [[id number]]   Have you contacted your pharmacy to request a refill? Yes   Which pharmacy would you like this sent to?  [id number] [place], [place] - [address] [place] [address] Phone: [phone] Fax: [phone]    Patient notified that their request is being sent to the clinical staff for review and that they should receive a response within 2 business days.   Please advise at [facility] [phone]

## 2024-01-02 ENCOUNTER — Telehealth: Payer: Self-pay | Admitting: Family Medicine

## 2024-01-02 DIAGNOSIS — R079 Chest pain, unspecified: Secondary | ICD-10-CM

## 2024-01-02 NOTE — Telephone Encounter (Unsigned)
 Copied from CRM [id number]. Topic: Clinical - Medication Refill >> Jan 02, 2024  1:23 PM Avram MATSU wrote: Medication: pantoprazole  (PROTONIX ) 40 MG tablet [[id number]]  Has the patient contacted their pharmacy? Yes (Agent: If no, request that the patient contact the pharmacy for the refill. If patient does not wish to contact the pharmacy document the reason why and proceed with request.) (Agent: If yes, when and what did the pharmacy advise?)  This is the patient's preferred pharmacy:  [id number] [place], [place] - [address] [place] [address] Phone: [phone] Fax: [phone]  Is this the correct pharmacy for this prescription? Yes If no, delete pharmacy and type the correct one.   Has the prescription been filled recently? No  Is the patient out of the medication? No  Has the patient been seen for an appointment in the last year OR does the patient have an upcoming appointment? Yes  Can we respond through MyChart? No  Agent: Please be advised that Rx refills may take up to 3 business days. We ask that you follow-up with your pharmacy.

## 2024-01-04 MED ORDER — PANTOPRAZOLE SODIUM 40 MG PO TBEC: 40.0000 mg | DELAYED_RELEASE_TABLET | Freq: Every day | ORAL | 0 refills | Status: AC

## 2024-01-04 NOTE — Telephone Encounter (Signed)
 Requested Prescriptions  Pending Prescriptions Disp Refills   pantoprazole  (PROTONIX ) 40 MG tablet 90 tablet 0    Sig: Take 1 tablet (40 mg total) by mouth daily.     Gastroenterology: Proton Pump Inhibitors Passed - 01/04/2024 12:35 PM      Passed - Valid encounter within last 12 months    Recent Outpatient Visits           1 month ago Subconjunctival hemorrhage of right eye   [place] [facility] Medicine Duanne, Butler DASEN, MD   7 months ago Encounter for [employer] annual wellness exam   [place] [facility] Duanne Butler DASEN, MD   8 months ago Atrial fibrillation, unspecified type [facility])   [place] [facility] Medicine Duanne Butler DASEN, MD   1 year ago Stage 3b chronic kidney disease [facility])   [place] [facility] Duanne Butler DASEN, MD   2 years ago Stage 3b chronic kidney disease [facility])   [place] [facility] Medicine Pickard, Butler DASEN, MD

## 2024-01-13 ENCOUNTER — Telehealth: Payer: Self-pay

## 2024-01-13 ENCOUNTER — Other Ambulatory Visit: Payer: Self-pay

## 2024-01-13 DIAGNOSIS — Z8673 Personal history of transient ischemic attack (TIA), and cerebral infarction without residual deficits: Secondary | ICD-10-CM

## 2024-01-13 MED ORDER — RIVAROXABAN 15 MG PO TABS: 15.0000 mg | ORAL_TABLET | Freq: Every day | ORAL | 3 refills | Status: AC

## 2024-01-13 NOTE — Telephone Encounter (Signed)
 Copied from CRM #[id number]. Topic: Clinical - Prescription Issue >> Jan 13, 2024  3:57 PM Tonda B wrote: Reason for CRM: patient daughter is calling has questions about rx Rivaroxaban  (XARELTO ) 15 MG TABS tablet please call [phone]

## 2024-02-11 ENCOUNTER — Other Ambulatory Visit: Payer: Self-pay | Admitting: Family Medicine

## 2024-02-13 ENCOUNTER — Other Ambulatory Visit: Payer: Self-pay | Admitting: Family Medicine

## 2024-02-13 DIAGNOSIS — E782 Mixed hyperlipidemia: Secondary | ICD-10-CM

## 2024-02-13 DIAGNOSIS — E038 Other specified hypothyroidism: Secondary | ICD-10-CM

## 2024-02-13 NOTE — Telephone Encounter (Unsigned)
 Copied from CRM #[id number]. Topic: Clinical - Medication Refill >> Feb 13, 2024  3:40 PM Zebedee SAUNDERS wrote: Medication: atorvastatin  (LIPITOR) 20 MG tablet, levothyroxine  (SYNTHROID ) 112 MCG tablet  Has the patient contacted their pharmacy? Yes (Agent: If no, request that the patient contact the pharmacy for the refill. If patient does not wish to contact the pharmacy document the reason why and proceed with request.) (Agent: If yes, when and what did the pharmacy advise?)Pharmacy need script.   This is the patient's preferred pharmacy:  [id number] [place], [place] - [address] [place] [address] Phone: [phone] Fax: [phone]  Is this the correct pharmacy for this prescription? Yes If no, delete pharmacy and type the correct one.   Has the prescription been filled recently? Yes  Is the patient out of the medication? Yes  Has the patient been seen for an appointment in the last year OR does the patient have an upcoming appointment? Yes  Can we respond through MyChart? Yes  Agent: Please be advised that Rx refills may take up to 3 business days. We ask that you follow-up with your pharmacy.

## 2024-02-14 MED ORDER — ATORVASTATIN CALCIUM 20 MG PO TABS: 20.0000 mg | ORAL_TABLET | Freq: Every day | ORAL | 0 refills | Status: AC

## 2024-02-14 MED ORDER — LEVOTHYROXINE SODIUM 112 MCG PO TABS: 112.0000 ug | ORAL_TABLET | ORAL | 1 refills | Status: AC

## 2024-02-14 NOTE — Telephone Encounter (Signed)
 Requested Prescriptions  Pending Prescriptions Disp Refills   atorvastatin  (LIPITOR) 20 MG tablet 90 tablet 0    Sig: Take 1 tablet (20 mg total) by mouth daily.     Cardiovascular:  Antilipid - Statins Failed - 02/14/2024  1:14 PM      Failed - Lipid Panel in normal range within the last 12 months    Cholesterol  Date Value Ref Range Status  05/05/2023 139 <200 mg/dL Final   LDL Cholesterol (Calc)  Date Value Ref Range Status  05/05/2023 56 mg/dL (calc) Final    Comment:    Reference range: <100 . Desirable range <100 mg/dL for primary prevention;   <70 mg/dL for patients with CHD or diabetic patients  with > or = 2 CHD risk factors. SABRA LDL-C is now calculated using the Martin-Hopkins  calculation, which is a validated novel method providing  better accuracy than the Friedewald equation in the  estimation of LDL-C.  Gladis APPLETHWAITE et al. SANDREA. 7986;689(80): 2061-2068  (http://education.[facility].com/faq/FAQ164)    Direct LDL  Date Value Ref Range Status  05/25/2011 151.1 mg/dL Final    Comment:    Optimal:  <100 mg/dLNear or Above Optimal:  100-129 mg/dLBorderline High:  130-159 mg/dLHigh:  160-189 mg/dLVery High:  >190 mg/dL   HDL  Date Value Ref Range Status  05/05/2023 68 > OR = 50 mg/dL Final   Triglycerides  Date Value Ref Range Status  05/05/2023 74 <150 mg/dL Final         Passed - Patient is not pregnant      Passed - Valid encounter within last 12 months    Recent Outpatient Visits           2 months ago Subconjunctival hemorrhage of right eye   [place] [facility] Duanne Butler DASEN, MD   9 months ago Encounter for [employer] annual wellness exam   [place] [facility] Duanne Butler DASEN, MD   9 months ago Atrial fibrillation, unspecified type [facility])   [place] [facility] Duanne Butler DASEN, MD   1 year ago Stage 3b chronic kidney disease [facility])   [place] [facility]  Duanne Butler DASEN, MD   2 years ago Stage 3b chronic kidney disease [facility])   [place] [facility] Pickard, Butler DASEN, MD               levothyroxine  (SYNTHROID ) 112 MCG tablet 45 tablet 1    Sig: Take 1 tablet (112 mcg total) by mouth every other day.     Endocrinology:  Hypothyroid Agents Passed - 02/14/2024  1:14 PM      Passed - TSH in normal range and within 360 days    TSH  Date Value Ref Range Status  05/05/2023 2.22 0.40 - 4.50 mIU/L Final         Passed - Valid encounter within last 12 months    Recent Outpatient Visits           2 months ago Subconjunctival hemorrhage of right eye   [place] [facility] Duanne Butler DASEN, MD   9 months ago Encounter for [employer] annual wellness exam   [place] [facility] Duanne Butler DASEN, MD   9 months ago Atrial fibrillation, unspecified type [facility])   [place] [facility] Medicine Duanne Butler DASEN, MD   1 year ago Stage 3b chronic kidney disease (HCC)   [facility] Duanne, Butler DASEN, MD   2 years ago Stage 3b chronic kidney disease [facility])   [place] [facility] Family Medicine Pickard, Butler DASEN, MD

## 2024-02-29 ENCOUNTER — Other Ambulatory Visit: Payer: Self-pay | Admitting: Family Medicine

## 2024-02-29 NOTE — Telephone Encounter (Signed)
 Copied from CRM [id number]. Topic: Clinical - Medication Refill >> Feb 29, 2024  4:00 PM Hadassah PARAS wrote: Medication: calcitRIOL (ROCALTROL) 0.25 MCG capsule   Has the patient contacted their pharmacy? Yes (Agent: If no, request that the patient contact the pharmacy for the refill. If patient does not wish to contact the pharmacy document the reason why and proceed with request.) (Agent: If yes, when and what did the pharmacy advise?)  This is the patient's preferred pharmacy:  [id number] [place], [place] - [address] [place] [address] Phone: [phone] Fax: [phone]  Is this the correct pharmacy for this prescription? Yes If no, delete pharmacy and type the correct one.   Has the prescription been filled recently? Yes  Is the patient out of the medication? No  Has the patient been seen for an appointment in the last year OR does the patient have an upcoming appointment? Yes  Can we respond through MyChart? No  Agent: Please be advised that Rx refills may take up to 3 business days. We ask that you follow-up with your pharmacy.

## 2024-03-02 NOTE — Telephone Encounter (Signed)
 Requested medication (s) are due for refill today: yes  Requested medication (s) are on the active medication list: no  Last refill:  07/26/22  Future visit scheduled: no  Notes to clinic:  historical medication     Requested Prescriptions  Pending Prescriptions Disp Refills   calcitRIOL (ROCALTROL) 0.25 MCG capsule      Sig: Take 1 capsule (0.25 mcg total) by mouth daily. Take 1 by mouth every other day     Endocrinology:  Vitamins - Vitamin D Supplementation - calcitriol Failed - 03/02/2024 11:53 AM      Failed - Phosphate in normal range and within 360 days    Phosphorus  Date Value Ref Range Status  01/04/2022 3.2 2.1 - 4.3 mg/dL Final         Failed - PTH in normal range and within 360 days    PTH  Date Value Ref Range Status  01/04/2022 146 (H) 16 - 77 pg/mL Final    Comment:    . Interpretive Guide    Intact PTH           Calcium  ------------------    ----------           ------- Normal Parathyroid    Normal               Normal Hypoparathyroidism    Low or Low Normal    Low Hyperparathyroidism    Primary            Normal or High       High    Secondary          High                 Normal or Low    Tertiary           High                 High Non-Parathyroid    Hypercalcemia      Low or Low Normal    High .          Passed - Ca in normal range and within 360 days    Calcium   Date Value Ref Range Status  11/22/2023 9.6 8.6 - 10.4 mg/dL Final   Calcium , Ion  Date Value Ref Range Status  01/04/2022 CANCELED      Comment:    TEST NOT PERFORMED . No suitable specimen received. Please review the test requirements at  [email or web address]  Result canceled by the ancillary.          Passed - Valid encounter within last 12 months    Recent Outpatient Visits           3 months ago Subconjunctival hemorrhage of right eye   [place] [facility] Pickard, Butler DASEN, MD   9 months ago Encounter for [employer] annual wellness  exam   [place] [facility] Duanne Butler DASEN, MD   10 months ago Atrial fibrillation, unspecified type [facility])   [place] [facility] Duanne Butler DASEN, MD   1 year ago Stage 3b chronic kidney disease [facility])   [place] [facility] Duanne Butler DASEN, MD   2 years ago Stage 3b chronic kidney disease [facility])   [place] [facility] Pickard, Butler DASEN, MD

## 2024-03-02 NOTE — Telephone Encounter (Signed)
 Sent to provider for review and approval.
